# Patient Record
Sex: Male | Born: 1950
Health system: Southern US, Community
[De-identification: ages and names within clinical notes are randomized; demographics above are authoritative.]

## PROBLEM LIST (undated history)

## (undated) DIAGNOSIS — E785 Hyperlipidemia, unspecified: Secondary | ICD-10-CM

## (undated) DIAGNOSIS — M25562 Pain in left knee: Secondary | ICD-10-CM

## (undated) DIAGNOSIS — Z87442 Personal history of urinary calculi: Secondary | ICD-10-CM

## (undated) DIAGNOSIS — K219 Gastro-esophageal reflux disease without esophagitis: Secondary | ICD-10-CM

## (undated) DIAGNOSIS — J329 Chronic sinusitis, unspecified: Secondary | ICD-10-CM

## (undated) DIAGNOSIS — N529 Male erectile dysfunction, unspecified: Secondary | ICD-10-CM

## (undated) DIAGNOSIS — M25561 Pain in right knee: Secondary | ICD-10-CM

## (undated) DIAGNOSIS — J189 Pneumonia, unspecified organism: Secondary | ICD-10-CM

## (undated) DIAGNOSIS — H9190 Unspecified hearing loss, unspecified ear: Secondary | ICD-10-CM

## (undated) HISTORY — DX: Unspecified hearing loss, unspecified ear: H91.90

## (undated) HISTORY — PX: SINUS ENDO WITH FUSION: SHX5329

## (undated) HISTORY — DX: Pain in left knee: M25.562

## (undated) HISTORY — DX: Male erectile dysfunction, unspecified: N52.9

## (undated) HISTORY — PX: TOE SURGERY: SHX1073

## (undated) HISTORY — PX: EYE SURGERY: SHX253

## (undated) HISTORY — DX: Pain in right knee: M25.561

## (undated) HISTORY — PX: POLYPECTOMY: SHX149

## (undated) HISTORY — PX: MASTOIDECTOMY: SHX711

## (undated) HISTORY — DX: Chronic sinusitis, unspecified: J32.9

## (undated) HISTORY — PX: JOINT REPLACEMENT: SHX530

## (undated) HISTORY — PX: OTHER SURGICAL HISTORY: SHX169

## (undated) HISTORY — DX: Hyperlipidemia, unspecified: E78.5

---

## 1998-12-15 ENCOUNTER — Emergency Department (HOSPITAL_COMMUNITY): Admission: EM | Admit: 1998-12-15 | Discharge: 1998-12-15 | Payer: Self-pay | Admitting: Emergency Medicine

## 1998-12-15 ENCOUNTER — Encounter: Payer: Self-pay | Admitting: Emergency Medicine

## 1999-08-04 ENCOUNTER — Emergency Department (HOSPITAL_COMMUNITY): Admission: EM | Admit: 1999-08-04 | Discharge: 1999-08-04 | Payer: Self-pay | Admitting: Emergency Medicine

## 2001-06-27 ENCOUNTER — Ambulatory Visit (HOSPITAL_BASED_OUTPATIENT_CLINIC_OR_DEPARTMENT_OTHER): Admission: RE | Admit: 2001-06-27 | Discharge: 2001-06-27 | Payer: Self-pay | Admitting: *Deleted

## 2003-12-13 ENCOUNTER — Inpatient Hospital Stay (HOSPITAL_COMMUNITY): Admission: AD | Admit: 2003-12-13 | Discharge: 2003-12-14 | Payer: Self-pay | Admitting: Otolaryngology

## 2005-07-30 ENCOUNTER — Inpatient Hospital Stay (HOSPITAL_COMMUNITY): Admission: EM | Admit: 2005-07-30 | Discharge: 2005-07-31 | Payer: Self-pay | Admitting: Emergency Medicine

## 2005-07-30 ENCOUNTER — Ambulatory Visit: Payer: Self-pay | Admitting: Internal Medicine

## 2007-10-27 ENCOUNTER — Ambulatory Visit: Payer: Self-pay | Admitting: Internal Medicine

## 2007-10-27 DIAGNOSIS — J309 Allergic rhinitis, unspecified: Secondary | ICD-10-CM | POA: Insufficient documentation

## 2007-10-27 DIAGNOSIS — J45909 Unspecified asthma, uncomplicated: Secondary | ICD-10-CM | POA: Insufficient documentation

## 2007-10-27 DIAGNOSIS — H8309 Labyrinthitis, unspecified ear: Secondary | ICD-10-CM | POA: Insufficient documentation

## 2008-12-19 ENCOUNTER — Ambulatory Visit: Payer: Self-pay | Admitting: Internal Medicine

## 2008-12-19 LAB — CONVERTED CEMR LAB
ALT: 22 units/L (ref 0–53)
AST: 20 units/L (ref 0–37)
Albumin: 3.7 g/dL (ref 3.5–5.2)
BUN: 24 mg/dL — ABNORMAL HIGH (ref 6–23)
Basophils Absolute: 0 10*3/uL (ref 0.0–0.1)
Calcium: 9 mg/dL (ref 8.4–10.5)
Cholesterol: 203 mg/dL — ABNORMAL HIGH (ref 0–200)
Creatinine, Ser: 0.9 mg/dL (ref 0.4–1.5)
Eosinophils Relative: 12.5 % — ABNORMAL HIGH (ref 0.0–5.0)
HCT: 40.9 % (ref 39.0–52.0)
Hemoglobin: 14.1 g/dL (ref 13.0–17.0)
Leukocytes, UA: NEGATIVE
Lymphs Abs: 2 10*3/uL (ref 0.7–4.0)
MCV: 96.9 fL (ref 78.0–100.0)
Monocytes Absolute: 0.6 10*3/uL (ref 0.1–1.0)
Monocytes Relative: 8.3 % (ref 3.0–12.0)
Neutro Abs: 3.3 10*3/uL (ref 1.4–7.7)
PSA: 1.11 ng/mL (ref 0.10–4.00)
Platelets: 214 10*3/uL (ref 150.0–400.0)
RDW: 12.2 % (ref 11.5–14.6)
Specific Gravity, Urine: >=1.03 (ref 1.000–1.030)
TSH: 2.61 microintl units/mL (ref 0.35–5.50)
Total Bilirubin: 0.7 mg/dL (ref 0.3–1.2)
Triglycerides: 76 mg/dL (ref 0.0–149.0)
Urine Glucose: NEGATIVE mg/dL
Urobilinogen, UA: 0.2 (ref 0.0–1.0)

## 2009-01-04 ENCOUNTER — Ambulatory Visit: Payer: Self-pay | Admitting: Internal Medicine

## 2009-01-04 DIAGNOSIS — F411 Generalized anxiety disorder: Secondary | ICD-10-CM

## 2009-01-04 DIAGNOSIS — E785 Hyperlipidemia, unspecified: Secondary | ICD-10-CM

## 2010-02-05 ENCOUNTER — Ambulatory Visit: Payer: Self-pay | Admitting: Internal Medicine

## 2010-02-05 DIAGNOSIS — R519 Headache, unspecified: Secondary | ICD-10-CM | POA: Insufficient documentation

## 2010-02-05 DIAGNOSIS — J329 Chronic sinusitis, unspecified: Secondary | ICD-10-CM | POA: Insufficient documentation

## 2010-02-05 DIAGNOSIS — R51 Headache: Secondary | ICD-10-CM | POA: Insufficient documentation

## 2010-02-13 ENCOUNTER — Ambulatory Visit (HOSPITAL_COMMUNITY): Admission: RE | Admit: 2010-02-13 | Discharge: 2010-02-13 | Payer: Self-pay | Admitting: Internal Medicine

## 2010-02-23 ENCOUNTER — Encounter: Payer: Self-pay | Admitting: Internal Medicine

## 2010-03-02 ENCOUNTER — Ambulatory Visit: Payer: Self-pay | Admitting: Interventional Radiology

## 2010-03-02 ENCOUNTER — Emergency Department (HOSPITAL_BASED_OUTPATIENT_CLINIC_OR_DEPARTMENT_OTHER): Admission: EM | Admit: 2010-03-02 | Discharge: 2010-03-02 | Payer: Self-pay | Admitting: Emergency Medicine

## 2010-03-11 ENCOUNTER — Ambulatory Visit: Payer: Self-pay | Admitting: Internal Medicine

## 2010-08-23 ENCOUNTER — Emergency Department (HOSPITAL_BASED_OUTPATIENT_CLINIC_OR_DEPARTMENT_OTHER)
Admission: EM | Admit: 2010-08-23 | Discharge: 2010-08-23 | Payer: Self-pay | Source: Home / Self Care | Admitting: Emergency Medicine

## 2010-08-26 LAB — BASIC METABOLIC PANEL
BUN: 24 mg/dL — ABNORMAL HIGH (ref 6–23)
CO2: 23 mEq/L (ref 19–32)
Calcium: 8.7 mg/dL (ref 8.4–10.5)
Creatinine, Ser: 1 mg/dL (ref 0.4–1.5)
GFR calc non Af Amer: >60 mL/min (ref >60)
Glucose, Bld: 85 mg/dL (ref 70–99)
Sodium: 142 mEq/L (ref 135–145)

## 2010-08-26 LAB — DIFFERENTIAL
Basophils Absolute: 0 10*3/uL (ref 0.0–0.1)
Eosinophils Relative: 19 % — ABNORMAL HIGH (ref 0–5)
Lymphocytes Relative: 34 % (ref 12–46)
Monocytes Absolute: 0.7 10*3/uL (ref 0.1–1.0)
Monocytes Relative: 8 % (ref 3–12)

## 2010-08-26 LAB — CBC
HCT: 39.2 % (ref 39.0–52.0)
MCH: 31.9 pg (ref 26.0–34.0)
MCHC: 35.2 g/dL (ref 30.0–36.0)
MCV: 90.7 fL (ref 78.0–100.0)
RDW: 12.5 % (ref 11.5–15.5)

## 2010-09-02 NOTE — Assessment & Plan Note (Signed)
Summary: DR [facility] PT/[facility]--HEAD PAIN-PARAMEDICS/02/03/10-STC   Vital Signs:  Patient profile:   60 year old male Height:      71 inches (180.34 cm) Weight:      185.8 pounds (84.45 kg) BMI:     26.01 O2 Sat:      98 % on Room air Temp:     98.4 degrees F (36.89 degrees C) oral Pulse rate:   54 / minute BP sitting:   140 / 90  (left arm) Cuff size:   regular  Vitals Entered By: Orlan Leavens (February 05, 2010 11:21 AM)  O2 Flow:  Room air CC: Head pain, Headaches Is Patient Diabetic? No Pain Assessment Patient in pain? yes     Location: head Type: sharp @ times Comments Pt states been having some head pain x's 6 weeks. On Monday had episode pain in top of his head. sharp apin goes to right side of his head. Wife called EMS. She also stated after sxs happen pt can't communicate say anything. Pt denies any blurred vision, light headed   Primary Care Provider:  Norins  CC:  Head pain and Headaches.  History of Present Illness:  Headaches      This is a 60 year old man who presents with Headaches.  The symptoms began 2 months ago.  On a scale of 1 to 10, the intensity is described as a moderate-severe.  no prior headaches but 3 episodes in past 6 weeks. last one 02/03/10 so severe wife called 911 but pt refused ER eval or transport due to resolution of symptoms. describes as sudden onset of sharp stabbling pain in left temporal region. pain lasts <5 seconds then spontaneous and complete resolution - no residual pain. during the pain attack, wife reports pt "dazed and unresponsive, unable to talk or move" - no seizure activity or LOC.  The patient reports sinus pain, sinus pressure, and photophobia, but denies nausea, vomiting, sweats, and tearing of eyes.  The headache is described as stabbing and sharp.  The location of the pain is first on left and then bilateral.  High-risk features (red flags) include altered mental status, new type of headache, age >50 years, and concomitant infection  (sinus and ear, chronic).  The patient denies the following high-risk features: fever, neck pain/stiffness, vision loss or change, focal weakness, rash, trauma, pain worse with exertion, immunosuppression, and anticoagulation use.  Prior treatment has included no medication.    Current Medications (verified): 1)  Flonase 50 Mcg/act  Susp (Fluticasone Propionate) .... As Needed 2)  Symbicort 165 .... 2 Puffs Orally Qid  Allergies (verified): No Known Drug Allergies  Past History:  Past Medical History: Allergic rhinitis Asthma-has been hospitalized and ventilated chronic sinusitus and ear infections - s/p surg for same 12/2003 with post op seizure ([facility])  Past Surgical History: mastoidectomy 12/2003 Dorma Russell) right hand surgery for infection '07  Social History: HSG  married '76-6 years divorced; '85 1 son - ''37 work: Retail banker department  Review of Systems       The patient complains of decreased hearing.  The patient denies vision loss, hoarseness, syncope, incontinence, suspicious skin lesions, transient blindness, and difficulty walking.    Physical Exam  General:  alert, well-developed, well-nourished, and cooperative to examination.   nontoxic - wife at side Ears:  L with hearing aid - when removed, normal pinnae bilaterally, without erythema, swelling, or tenderness to palpation. L TMs hazy but without effusion, or cerumen impaction. R TM clear  with performation, no drainage or effusion. Hearing grossly diminished bilaterally  Mouth:  teeth and gums in good repair; mucous membranes moist, without lesions or ulcers. oropharynx clear without exudate, min erythema. +yellow PND Lungs:  normal respiratory effort, no intercostal retractions or use of accessory muscles; normal breath sounds bilaterally - no crackles and no wheezes.    Heart:  normal rate, regular rhythm, no murmur, and no rub. BLE without edema. Neurologic:  alert & oriented X3 and cranial nerves II-XII  symetrically intact.  strength normal in all extremities, sensation intact to light touch, and gait normal. speech fluent without dysarthria or aphasia; follows commands with good comprehension.    Impression & Recommendations:  Problem # 1:  HEADACHE (ICD-784.0)  ?migraine type feature or relation to underlying sinus infx hx -- neuro exam benign but given hx prior sz with infx postop in 12/2003, feel MRI brain warranted - episodes too brief to require med tx at this time - f/u PCP on same in 2-4 weeks to review  Orders: Radiology Referral (Radiology)  Problem # 2:  OTHER CHRONIC SINUSITIS (ICD-473.8)  hosp and op hx 2005 reviewed on hosp EMR - tx 2 weeks augmentin at this time and refer back to ENT -[facility] preferred by pt/wife (lost to f/u d/t insurance issues at that time) His updated medication list for this problem includes:    Flonase 50 Mcg/act Susp (Fluticasone propionate) .Marland Kitchen... As needed    Augmentin 875-125 Mg Tabs (Amoxicillin-pot clavulanate) .Marland Kitchen... 1 by mouth two times a day x 2 weeks  Orders: Radiology Referral (Radiology) ENT Referral (ENT) Prescription Created Electronically [phone])  Problem # 3:  INSOMNIA, CHRONIC (ICD-307.42) intol temazeam in past- try amitrip (wife does well on same) -  Orders: Prescription Created Electronically [phone])  Complete Medication List: 1)  Flonase 50 Mcg/act Susp (Fluticasone propionate) .... As needed 2)  Symbicort 165  .... 2 puffs orally qid 3)  Augmentin 875-125 Mg Tabs (Amoxicillin-pot clavulanate) .Marland Kitchen.. 1 by mouth two times a day x 2 weeks 4)  Amitriptyline Hcl 10 Mg Tabs (Amitriptyline hcl) .Marland Kitchen.. 1 by mouth at bedtime as needed for sleep  Patient Instructions: 1)  it was good to see you today. 2)  2 weeks Augmentin for your infection - 3)  low dose amitriptyline for sleep- 4)  your prescriptions have been electronically submitted to your pharmacy. Please take as directed. Contact our office if you believe you're having  problems with the medication(s).  5)  we'll make referral for MRI brain and to Dr. Dorma Russell (ENT) . Our office will contact you regarding these appointments once made.  6)  Please schedule a follow-up appointment in 2-4 weeks with dr. Debby Bud to review, call sooner if problems.  Prescriptions: AMITRIPTYLINE HCL 10 MG TABS (AMITRIPTYLINE HCL) 1 by mouth at bedtime as needed for sleep  #30 x 1   Entered and Authorized by:   Newt Lukes MD   Signed by:   Newt Lukes MD on 02/05/2010   Method used:   Electronically to        [facility] # [phone]* (retail)       [address] [place], [place]  [address]       Ph: [phone]       Fax: [phone]   RxID:   [id number] AUGMENTIN 875-125 MG TABS (AMOXICILLIN-POT CLAVULANATE) 1 by mouth two times a day x 2 weeks  #28 x 0   Entered  and Authorized by:   Newt Lukes MD   Signed by:   Newt Lukes MD on 02/05/2010   Method used:   Electronically to        [facility] [phone]* (retail)       [address] [place], [place]  [address]       Ph: [phone]       Fax: [phone]   RxID:   [id number]

## 2010-09-02 NOTE — Letter (Signed)
   [facility] [address] [place], [place]  [address] Phone: [phone]      February 23, 2010   Stan Creger [address] [place], [place] [address]  RE:  LAB RESULTS  Dear  Mr. MATHENY,  The following is an interpretation of your most recent lab tests.  Please take note of any instructions provided or changes to medications that have resulted from your lab work.    MRI head from the 14th of July - post-surgical changes and chronic sinusitis. No obstructions noted.   Call or e-mail me if you have questions ([email or web address]).   Sincerely Yours,    Jacques Navy MD

## 2010-12-19 NOTE — Discharge Summary (Signed)
NAME:  Brett Berry NO.:  [id number]   MEDICAL RECORD NO.:  [id number]                   PATIENT TYPE:  INP   LOCATION:  3107                                 FACILITY:  [facility]   PHYSICIAN:  Carolan Shiver, M.D.                 DATE OF BIRTH:  1950/12/01   DATE OF ADMISSION:  12/13/2003  DATE OF DISCHARGE:  12/14/2003                                 DISCHARGE SUMMARY   ADMISSION DIAGNOSIS:  Focal spells, unknown etiology with decreased vision,  right arm movement, head turning and groaning following an uncomplicated  right canal wall tympanomastoidectomy earlier in the morning of Dec 13, 2003.   SUMMARY OF HOSPITALIZATION:  Brett Berry is a 60 year old white male who had  undergone an uncomplicated right tympanomastoidectomy in the morning of Dec 13, 2003 at the [facility] to treat chronic  right mastoiditis.  Brett Berry had an uncomplicated operation and was  recovering in the PACU at the [facility].   He was transferred to his room and approximately 3 p.m., began to have 5  unusual spells lasting 5-10 seconds which were comprised of a sudden head  turning to the left, groaning, complaint of decreased vision and jerking of  his right upper extremity.  He was completely awake immediately after each  spell, was oriented x3.  I witnessed one of the spells approximately 5:30  p.m.  His procedure was uncomplicated, lasted approximately 2 hours. There  was no dural exposure or exposure of the sigmoid sinus. He had received  general anesthetic along with Zofran 4 mg IV at the beginning and end of the  procedure and Decadron 10 mg IV along with Transderm scopolamine patch from  7:00 a.m. to 5:00 p.m.   He was seen by Dr. Dennie Bible __________ of anesthesia who did not think the spells  were secondary to the anesthesia, although we could not rule out an extra-  pyramidal type reaction to the Zofran.  He had received 500 cc  of fluid, had  700 cc of urine output and a 30 cc blood loss.  He did have a history of  PVCs and had undergone a negative Cardiolite study the week before at his  family doctor, Dr. Illene Regulus of [facility].   Brett Berry was transferred to [facility] NICU for observation. A head CT  without contrast, laboratory data including complete blood count with  differential and CMET, and a neurology consultation by Dr. Orlin Hilding.  The  patient underwent a CT scan of his head without contrast which was normal.  He had several spells in transport from the [facility] to the [facility].  He was seen by Dr. Orlin Hilding on Dec 14, 2003.   By Dec 14, 2003, he was awake, alert, had a questionable spell, vital signs  were stable,  temperature 98.5, stable vital signs and an SA02 of 99 on room  air.  Facial function was intact and his post-auricular drain was removed.  Electrolytes were within normal limits.  Hemoglobin was 14.4, hematocrit  41.7, white blood cell count 10,200.  Liver function tests were within  normal limits, sodium 140, potassium 4.6, CO2 108, BUN 9, creatinine 0.9,  glucose 119.  His EEG was portable done on Dec 14, 2003. It was a normal EEG  in the awake and drowsy state. There was no evidence of any ictal or  interictal discharges as read by Dr. Lesia Sago of neurology.  An MRI head  scan with and without gadolinium was ordered, and was found to be negative.   DISPOSITION:  The patient was discharged on Dec 14, 2003 on a regular diet,  quiet indoor activity x1 week and avoidance of water exposure a.d.  He was  to return to my office in 1 week for follow up and suture removal.  He was  to call [phone] for any postoperative problems. He was given both verbal  and written instructions as was his wife.   DISCHARGE MEDICATIONS:  1. Augmentin XR 1 p.o. b.i.d. x10 days.  2. Percocet 7.5/500 one p.o. q.6h. p.r.n. pain #30.  3. Phenergan suppositories 25 mg p.o.  q.6h. p.r.n. nausea.  4. Cipro HC drops, 3 drops a.d. t.i.d. x7 days.   The etiology of the spells was never determined.                                                Carolan Shiver, M.D.    EMK/MEDQ  D:  12/31/2003  T:  12/31/2003  Job:  [id number]

## 2010-12-19 NOTE — Procedures (Signed)
PROCEDURE:  Portable electroencephalogram recording done on Dec 14, 2003.   HISTORY OF PRESENT ILLNESS:  This patient is a 60 year old gentleman status  post right mastoidectomy with episodes of right arm tremor and staring  spells.  The patient is being evaluated for seizure events.   This is a portable EEG recording.  No skull defects were noted.   MEDICATIONS:  1. Pain medications.   EEG classification normal awake and drowsy.   DESCRIPTION OF PROCEDURE:  Description of the recording background rhythms.  This recording consists of a fairly well modulated medium amplitude alpha  rhythm of 9 hertz that is reactive to eye opening and closure.  As the  record progresses, the patient at times seems to enter the drowsy state with  dropout of the background rhythm activities and onset of a 7 to 8 hertz  slowing seen.  The patient does not appear to enter stage two sleep at any  time.  Photic stimulation and hyperventilation were not performed.  At no  time during the recording did there appear to be evidence of a spike or  spike wave discharges or ictal or inter-ictal discharges.  The EKG monitor  shows relatively frequent premature ventricular complexes with a heart rate  of 72.   IMPRESSION:  This is a normal EEG recording in the waking and drowsy state.  No evidence of ictal or inter-ictal discharges are seen.    Marlan Palau, M.D.   [id number]  D:  12/14/2003 16:11:16  T:  12/14/2003 17:51:59  Job #:  [id number]

## 2010-12-19 NOTE — H&P (Signed)
NAME:  Brett, Berry NO.:  [id number]   MEDICAL RECORD NO.:  [id number]                   PATIENT TYPE:  INP   LOCATION:  3107                                 FACILITY:  [facility]   PHYSICIAN:  Carolan Shiver, M.D.                 DATE OF BIRTH:  1950-11-11   DATE OF ADMISSION:  12/13/2003  DATE OF DISCHARGE:                                HISTORY & PHYSICAL   CHIEF COMPLAINT:  Jerky spells, status post tympanomastoidectomy.   HISTORY OF PRESENT ILLNESS:  Brett Berry is a 60 year old white male who  underwent a right tempanomastoidectomy for chronic mastoiditis this morning  at the [facility].  He was well until 3 p.m.  when he developed some jerking spells involving a sudden head turn to the  left, decreased vision, jerking of his right arm, groaning and quick  recovery without any postictal symptoms.  He was seen by Mauricio Po of  anesthesia who did not feel that this was an anesthetic reaction.  The  patient had received Zofran 4 mg IV at the beginning of the procedure, and 4  mg IV at the termination of the procedure as well as a Transderm Scopolamine  patch which had been in place from 7 a.m. to 5 p.m. and Decadron 10 mg at 8  a.m. and another 8 mg at 2 p.m.  The patient was completely awake  immediately after the spell and was oriented x3.  I witnessed one of the  spells at his bedside.  He states that he had sudden pain in his right ear,  and this would trigger the spell.  He had a total of five spells at [facility], and two spells following transfer to [facility].  The  patient again underwent a right tympanomastoidectomy this morning to treat  chronic mastoiditis.  The procedure was uncomplicated, lasting two hours.  No dura or sigmoid sinus was exposed.  The patient does not have any history  of head trauma, previous seizures or stroke.  He received a general  anesthetic along with his Zofran as  previously noted.  A glucose done after  one of the spells was 177.  This was after receiving the Decadron today.  He  was treated with Versed 1 mg with no response and then 2 mg later caused  sleeping, but the spells continued.  His vital signs have been completely  stable.  He had received 1500 cc of IV fluid intraoperatively, and had a 700  cc urine output along with a 30 cc blood loss.  There was no reason for him  to be electrolyte imbalanced.  He had frequent PVC's on EKG and clinically,  but had had a negative Cardiolite study last week ordered by Dr. Rosalyn Gess.  Norins, his family physician.   The patient was transferred to [facility] for  observation.  A CT scan of his without contrast and CBC with diff and CMET  will be ordered.  The EMT's reported two spells on the way from [facility]  to the hospital.  He had no nausea, vomiting, drooling, etc.   PAST MEDICAL HISTORY:  1. A history of asthma.  2. Sinus infection.  3. Chronic ear disease.   OPERATIONS:  1. Sinus procedure.  2. Tubes in the past.  3. Today, he underwent an uncomplicated right tempanomastoidectomy with     placement of a T-tube in his right tympanic membrane.  He was found to     have chronic infection in his mastoid and normal middle ear, mobile     ossicles and no exposure of his facial nerve or any dura.   MEDICATION ALLERGIES:  None reported.   FAMILY HISTORY:  Positive for heart disease and hearing loss.   SOCIAL HISTORY:  He is married.  He works as a Merchandiser, retail.  He has a 12th  grade education.  He does not use alcohol or tobacco products or any illegal  drugs.   REVIEW OF SYSTEMS:  Positive for a history of fever and weight loss from his  ear infections, the chronic ear disease, difficulty smelling after his sinus  operation, history of asthma, and renal lithiasis.  He has had some seasonal  allergies.   PHYSICAL EXAMINATION:  VITAL SIGNS:  Blood pressure 124/70,  pulse 59 and  regular, with frequent PVC's, respiratory rate 18, SAO2 96 percent on room  air.  Glucose is 177.  HEENT:  His head was normocephalic with bilateral symmetric facial motion.  He had no nystagmus.  Pupils PERRLA, external ears were stable.  Right posterior incision was  stable with a drain in place, draining minimal fluid.  Left tube is in  position.  The ear is dry.  Nose is negative.  Oral cavity, lips, tongue and  palate normal.  NECK:  Negative.  CHEST:  Clear.  HEART:  Normal sinus rhythm with intermittent PVC's.  ABDOMEN:  Chest was otherwise clear.  Abdomen was benign.  EXTREMITIES:  Negative.  NEUROLOGIC:  He was oriented x3 and able to calculate.   IMPRESSION:  Intermittent jerking episodes lasting 5-10 seconds, associated  with decreased vision, head turning to the left, right arm shaking and  immediate awareness after each spell.  It is possible that he is having a  delayed reaction to ondansetron.  I doubt that he has any electrolyte  imbalance.  I can not rule out an intracranial process.   RECOMMENDATIONS:  1. Observation in ICU tonight.  2. CT scan of his head without contrast.  3. CBC with differential and CMET.  4. Urology consultation.  5. I have spoken to the patient and his family, including his wife and     extended family.                                                Carolan Shiver, M.D.    EMK/MEDQ  D:  12/13/2003  T:  12/13/2003  Job:  [id number]

## 2010-12-19 NOTE — Consult Note (Signed)
NAME:  Brett Berry, Brett Berry NO.:  [id number]   MEDICAL RECORD NO.:  [id number]                   PATIENT TYPE:  INP   LOCATION:  3107                                 FACILITY:  [facility]   PHYSICIAN:  Marlan Palau, M.D.               DATE OF BIRTH:  May 14, 1951   DATE OF CONSULTATION:  12/14/2003  DATE OF DISCHARGE:                                   CONSULTATION   CONSULTING PHYSICIAN:  Marlan Palau, M.D.   HISTORY OF PRESENT ILLNESS:  Brett Berry is a 60 year old, right handed,  white male, born 1951/05/01, with a history of asthma and sinus  problems.  The patient was brought in for a right mastoidectomy that  occurred without complication yesterday.  This patient noted that around 3  p.m. yesterday began to have onset of right upper extremity tremors,  starring spells.  These episodes last 15-30 seconds, and the patient would  have no postictal confusion following the events.  This patient would not  remember events occurring during the spells however.  The patient began  having the events every 15 minutes which persisted until about 10 p.m.  The  patient was treated with some versed.  Wife claims that the events would  occur even while the patient was sleeping.  The patient underwent a CT scan  of the brain that is unremarkable by my review.  Neurology is asked to see  this patient for further evaluation.  The patient has not been treated with  any other anticonvulsants per se.  He has not had any event since 10 p.m.  yesterday.  He is feeling fine, feeling normal today.  The patient denies  any focal numbness or weakness on the face, arms, legs, speech trouble,  swallowing problems.   PAST MEDICAL HISTORY:  1. History of new onset spells of right sided tremor, rule out seizures.  2. History of asthma with ventilator assistance in the past.  3. History of right mastoidectomy this admission for renal __________  V     sinus surgery in the  past.  4. Decreased auditory acuity, hearing aid in the left ear.   MEDICATIONS:  At this time include:  Decadron 6 mg one dose, morphine if  needed., Senokot if needed, Versed p.r.n., Demerol p.r.n.   The patient does not smoke.  Drinks alcohol on occasion.   Again, no known allergies.   SOCIAL HISTORY:  The patient is married, lives in the [place] area, works  as a Passenger transport manager.  The patient has one son.  Has a history of asthma.   FAMILY MEDICAL HISTORY:  Notable that mother is alive, has had history of  knee hip problems, hypotension.  Father alive with coronary artery disease,  prostate cancer, senile dementia of Alzheimer's type.  The patient has two  half brothers and sisters with coronary artery disease, five full  brothers  and sisters who are alive and well.   REVIEW OF SYSTEMS:  Noted for no recent fevers, chills.  The patient has had  some headache pain following surgery but not prior to surgery.  The patient  denies any trouble swallowing, choking, shortness of breath, chest pain.  The patient denies any double vision, loss of vision.  Denies any trouble  controlling the bowels or bladder.  Denies focal numbness or weakness on the  face, arms, or legs.  Denies any previous episodes of tremor or seizures.   PHYSICAL EXAMINATION:  VITAL SIGNS:  Blood pressure is 114/46, heart rate is  68, respiratory rate is 17, temperature afebrile.  GENERAL:  This patient is a well developed white male who is alert,  cooperative at the time of examination.  HEENT:  Head is atraumatic with the exception of the surgical site on the  right.  Pupils equal, round and reactive to light.  Disks are flat  bilaterally.  NECK:  Supple.  No carotid bruits noted.  RESPIRATORY:  Clear.  CARDIOVASCULAR:  Reveals a regular rate and rhythm.  No obvious murmurs rubs  noted.  EXTREMITIES:  Without significant edema.  NEUROLOGIC:  Cranial nerves as above.  Facial symmetry is present.  The   patient has good sensation of the face to pinprick and soft touch  bilaterally.  He has good strength of the facial muscles, muscles of head  turning, shoulder shrug bilaterally.  Speech is well enunciated, not  aphasic.  Motor testing was 5/5 strength in all four extremities.  Good  symmetric motor tone is noted throughout.  Sensory testing is intact to pin  prick, soft touch, vibratory sensation throughout.  The patient has fair  finger-to-nose-finger, toe-to-finger bilaterally.  The patient was not  ambulated.  Deep tendon reflexes are symmetric, normal.  Toes were neutral  bilaterally.  Again, no drift is seen.   LABORATORY VALUES:  Notable for hemoglobin 15.2, otherwise no blood results  available.   IMPRESSION:  1. Episodic tremor, right upper extremity, starring episodes rule out     seizure event.  2. Recent right mastoidectomy.   This patient is doing well at this point.  He is back to baseline.  The  patient believes that he may have had some unusual response to pain.  No  prior history of seizures.  No focal deficits at this point.  We will  proceed with further workup rule out a focal neurologic lesion underlying  the above events.   PLAN:  1. MRI of the brain.  2. EEG study.  3. No anticonvulsants for now.   We will follow the patient's clinical course while in-house.                                               Marlan Palau, M.D.    CKW/MEDQ  D:  12/14/2003  T:  12/14/2003  Job:  [id number]   cc:   Carolan Shiver, M.D.  [address]  [place]  [place] [address]  Fax: [phone]   [facility]  [address]  [address]

## 2010-12-19 NOTE — Op Note (Signed)
[name]  Patient:    Brett, Berry Visit Number: [id number] MRN: [id number]          Service Type: Attending:  Doris Cheadle. Lyman Bishop, M.D. Dictated by:   Doris Cheadle. Lyman Bishop, M.D. Proc. Date: 06/27/01                             Operative Report  PREOPERATIVE DIAGNOSIS:  Bilateral mucoid otitis.  POSTOPERATIVE DIAGNOSIS:  Bilateral mucoid otitis.  PROCEDURE:  Bilateral T-tube myringotomy.  SURGEON:  Robert L. Lyman Bishop, M.D.  ANESTHESIA:  General.  DESCRIPTION OF PROCEDURE:  This 60 year old white male has had a history of chronic pansinusitis, polypoid sinus degeneration, recurrent otitis media with mucoid otitis, has had previous extensive sinus surgery and bilateral tube myringotomies.  Patient recently treated for acute otitis media with persistent fluid.  Examination showed opaque, scarred, retracted, poorly mobile drums.  Audiogram showed a mixed hearing loss.  The patient is experiencing difficulty communicating.  Because of his hearing loss and because of his chronic recurring ear infections, T-tube myringotomies were recommended to stabilize the conductive hearing loss component, and the patient is admitted for surgery.  DESCRIPTION OF PROCEDURE:  After satisfactory general mask anesthesia using an LMA was induced, using the operative microscope, examination showed opaque, scarred, slightly retracted eardrums, bilateral radial myringotomy incisions were made in the anterior inferior quadrant of each eardrum.  The middle ear space on each side was for the most part well-aerated, but there was a small amount of very thick but clear mucoid exudate.  Modified Silastic T-tubes were inserted.  Cipro HC drops were instilled and insufflated through the tubes. There was no bleeding.  The patient tolerated the procedure well, was awakened from anesthesia and taken to the recovery room in satisfactory condition. Dictated by:   Doris Cheadle.  Lyman Bishop, M.D. Attending:  Doris Cheadle. Lyman Bishop, M.D. DD:  06/27/01 TD:  06/27/01 Job: [id number]

## 2010-12-19 NOTE — Op Note (Signed)
[name], Berry                 ACCOUNT NO.:  [id number]   MEDICAL RECORD NO.:  [id number]          PATIENT TYPE:  INP   LOCATION:  1825                         FACILITY:  [facility]   PHYSICIAN:  Katy Fitch. Sypher, M.D. DATE OF BIRTH:  [birth year]   DATE OF PROCEDURE:  07/30/2005  DATE OF DISCHARGE:                                 OPERATIVE REPORT   PREOPERATIVE DIAGNOSIS:  Chronic septic extensor tenosynovitis dorsal aspect  of right hand and wrist in fourth dorsal compartment status post injury on  July 17, 2005, status post unsuccessful treatment with oral antibiotic  therapy x13 days.   POSTOPERATIVE DIAGNOSIS:  Chronic septic extensor tenosynovitis of right  fourth dorsal compartment with fibrinous exudate, rice bodies and signs of  chronic tenosynovitis.   OPERATION:  Incision and drainage dorsal aspect of hand and incision and  drainage of right fourth dorsal compartment with debridement of fibrin rice  bodies and cultures for aerobic and anaerobic growth, acid-fast and fungal  growth as well as acid-fast and fungal smears.   OPERATING SURGEON:  Katy Fitch. Sypher, M.D.   ASSISTANT:  Molly Maduro Dasnoit PA-C.   ANESTHESIA:  General by LMA.   SUPERVISING ANESTHESIOLOGIST:  Kaylyn Layer. Michelle Piper, M.D.   SPECIMENS:  Note that we obtained aerobic and anaerobic cultures as well as  fibrin rice bodies for histopathologic evaluation to look for organisms.   INDICATIONS:  Brett Berry is a 60 year old gentleman referred through the  courtesy of Dr. Ellamae Sia at urgent medical care center for  evaluation and management chronic right extensor tenosynovitis.   Brett Berry reports that on July 17, 2005, he sustained a laceration to the  dorsal aspect of his right hand on a creel at work. He had two wounds, one  overlying the distal diaphysis of his long finger metacarpal and a second  overlying the capitate region of his right wrist.   Within 48 hours, he began to experience  swelling and redness consistent with  infection.   He was seen by the physicians at the urgent medical care center where his  wound was noted to be probably infected.  He was placed on doxycycline 100  mg p.o. b.i.d.   He initially had a satisfactory resolution of his erythema.  However,  approximately 48 hours ago, he developed acute swelling and pain. He then  developed signs of lymphangitis extending to the elbow. He returned to the  urgent medical care center on July 29, 2005, and was noted to have  probable sepsis of his extensor tendons.   Hand surgery consult was requested for the morning of July 30, 2005.   I was contacted by phone by Dr. Merla Riches and, due to the need for probable  incision and drainage, we recommended that Brett Berry be sent to the [employer] [facility] [name]. There, he was admitted by the medical  teaching service B as chronic MRSA infections another infections are  excellent teaching cases.   We have provided upper extremity orthopedic consultation.   Preoperatively, lab studies were checked. Brett Berry was noted to  have a white  count of 15,000 at the urgent medical care center 24 hours ago and a repeat  white count at this time revealed a white count of 12.1000.   A uric acid was attained and was notable for a normal value of 5.   Brett Berry was seen in consultation and advised to go for immediate incision  and drainage of his extensor tendons. He was noted to have no signs of wrist  joint sepsis at this time.   PROCEDURE:  Brett Berry was brought to the operating room and placed in the  supine position upon the operating table.   Following the induction of general anesthesia by LMA technique, the left arm  was prepped with Betadine soap and solution and sterilely draped. A  pneumatic tourniquet was applied to the proximal brachium.   Following elevation of the left arm for one minute, the arterial tourniquet  was inflated  to 220 mmHg.   Procedure commenced with excision of his prior traumatic wound crust.   Subcutaneous regions were carefully palpated and extensive tenosynovitis of  the fourth dorsal compartment was noted.   A blunt hemostat was used and a sucker was used to debride fibrin from the  fourth dorsal compartment. Several large specimens of fibrin rice body were  removed for aerobic and anaerobic culture as well as fungal and AFB smear  and culture.   The fourth dorsal compartment was subsequently opened proximal to the  extensor retinaculum and a hemostat was used to spread and remove fibrinous  tenosynovitis.   Through-and-through vessel loop drains were placed from the diaphyseal  region of long finger metacarpal to the distal margin of the retinaculum and  second through the deep fourth dorsal compartment and brought out proximal  to the extensor retinaculum.   After debridement of all palpable rice bodies, the wound was irrigated  thoroughly with triple embolic solution, followed by dressing of the wound  with Adaptic sterile gauze, sterile Webril and a volar plaster splint for  comfort.   Brett Berry tolerated surgery ad anesthesia well. There were no apparent  complications.   He was awakened from general anesthesia transferred to the recovery room  with stable vital signs.   He will be admitted to the hospital on the medicine B teaching service with  supervision of the upper extremity orthopedic service.   We anticipate 24-48 hours of IV vancomycin therapy as well as Zosyn  initially followed by appropriate oral antibiotics once we have our culture  and smear results.      Katy Fitch Sypher, M.D.  Electronically Signed     RVS/MEDQ  D:  07/30/2005  T:  07/31/2005  Job:  [id number]   cc:   Medicine B Teaching Service Dr. Stephenie Acres

## 2011-07-06 ENCOUNTER — Emergency Department (HOSPITAL_BASED_OUTPATIENT_CLINIC_OR_DEPARTMENT_OTHER)
Admission: EM | Admit: 2011-07-06 | Discharge: 2011-07-06 | Disposition: A | Discharge status: Home / Self Care | Payer: BC Managed Care – PPO | Attending: Emergency Medicine | Admitting: Emergency Medicine

## 2011-07-06 ENCOUNTER — Emergency Department (INDEPENDENT_AMBULATORY_CARE_PROVIDER_SITE_OTHER): Payer: BC Managed Care – PPO

## 2011-07-06 ENCOUNTER — Encounter: Payer: Self-pay | Admitting: *Deleted

## 2011-07-06 DIAGNOSIS — M7150 Other bursitis, not elsewhere classified, unspecified site: Secondary | ICD-10-CM | POA: Insufficient documentation

## 2011-07-06 DIAGNOSIS — M25529 Pain in unspecified elbow: Secondary | ICD-10-CM

## 2011-07-06 DIAGNOSIS — M7989 Other specified soft tissue disorders: Secondary | ICD-10-CM

## 2011-07-06 DIAGNOSIS — IMO0001 Reserved for inherently not codable concepts without codable children: Secondary | ICD-10-CM | POA: Insufficient documentation

## 2011-07-06 DIAGNOSIS — M719 Bursopathy, unspecified: Secondary | ICD-10-CM

## 2011-07-06 DIAGNOSIS — J45909 Unspecified asthma, uncomplicated: Secondary | ICD-10-CM | POA: Insufficient documentation

## 2011-07-06 MED ORDER — HYDROCODONE-ACETAMINOPHEN 5-325 MG PO TABS
2.0000 | ORAL_TABLET | Freq: Once | ORAL | Status: AC
  Administered 2011-07-06: 2 via ORAL
  Filled 2011-07-06: qty 2

## 2011-07-06 MED ORDER — DOXYCYCLINE HYCLATE 100 MG PO CAPS: 100.0000 mg | ORAL_CAPSULE | Freq: Two times a day (BID) | ORAL | Status: AC

## 2011-07-06 MED ORDER — CEFTRIAXONE SODIUM 1 G IJ SOLR
1.0000 g | Freq: Once | INTRAMUSCULAR | Status: AC
  Administered 2011-07-06: 1 g via INTRAMUSCULAR
  Filled 2011-07-06: qty 10

## 2011-07-06 MED ORDER — CEPHALEXIN 500 MG PO CAPS: 500.0000 mg | ORAL_CAPSULE | Freq: Four times a day (QID) | ORAL | Status: AC

## 2011-07-06 MED ORDER — HYDROCODONE-ACETAMINOPHEN 5-325 MG PO TABS: 2.0000 | ORAL_TABLET | ORAL | Status: AC | PRN

## 2011-07-06 MED ORDER — LIDOCAINE HCL (PF) 1 % IJ SOLN
INTRAMUSCULAR | Status: AC
  Administered 2011-07-06: 15:00:00
  Filled 2011-07-06: qty 5

## 2011-07-06 NOTE — ED Notes (Signed)
Pt c/o left elbow and arm pain with redness noted x 1 day

## 2011-07-06 NOTE — ED Notes (Signed)
Left elbow is swollen, tender and reddened. Pt denies any injury or trauma.

## 2011-07-06 NOTE — ED Provider Notes (Signed)
History     CSN: [id number] Arrival date & time: 07/06/2011  1:02 PM   First MD Initiated Contact with Patient 07/06/11 1426      Chief Complaint  Patient presents with  . Arm Injury    (Consider location/radiation/quality/duration/timing/severity/associated sxs/prior treatment) Patient is a 60 y.o. male presenting with arm injury. The history is provided by the patient. No language interpreter was used.  Arm Injury  The incident occurred today. The injury mechanism is unknown. No protective equipment was used. He came to the ER via personal transport. The pain is moderate. It is unlikely that a foreign body is present. Pertinent negatives include no numbness and no tingling. There have been no prior injuries to these areas. He is right-handed. His tetanus status is UTD. There were no sick contacts.  Pt reports he began having swelling to left elbow.  Pt denies any injury.   Past Medical History  Diagnosis Date  . Asthma     Past Surgical History  Procedure Date  . Sinus endo with fusion     History reviewed. No pertinent family history.  History  Substance Use Topics  . Smoking status: Never Smoker   . Smokeless tobacco: Not on file  . Alcohol Use: No      Review of Systems  Constitutional: Negative.   Musculoskeletal: Positive for myalgias and joint swelling.  Skin: Positive for wound.  Neurological: Negative for tingling and numbness.  All other systems reviewed and are negative.    Allergies  Review of patient's allergies indicates no known allergies.  Home Medications   Current Outpatient Rx  Name Route Sig Dispense Refill  . BUDESONIDE-FORMOTEROL FUMARATE 160-4.5 MCG/ACT IN AERO Inhalation Inhale 2 puffs into the lungs 2 (two) times daily.      Marland Kitchen FLUTICASONE PROPIONATE 50 MCG/ACT NA SUSP Nasal Place 2 sprays into the nose daily.        BP 147/90  Pulse 67  Temp(Src) 98.1 F (36.7 C) (Oral)  Resp 16  Ht 6' (1.829 m)  Wt 200 lb (90.719 kg)  BMI  27.12 kg/m2  SpO2 98%  Physical Exam  Vitals reviewed. Constitutional: He appears well-developed and well-nourished.  HENT:  Head: Normocephalic and atraumatic.  Musculoskeletal: He exhibits edema and tenderness.       Swollen tender left elbow,  From,  nv and ns intact  Skin: There is erythema.  Psychiatric: He has a normal mood and affect.    ED Course  Procedures (including critical care time)  Labs Reviewed - No data to display Dg Elbow Complete Left  07/06/2011  *RADIOLOGY REPORT*  Clinical Data: Pain and swelling  LEFT ELBOW - COMPLETE 3+ VIEW  Comparison: None.  Findings: No discernible joint effusion.  No evidence of fracture, dislocation, degenerative change or other focal finding.  IMPRESSION: Negative radiographs  Original Report Authenticated By: Thomasenia Sales, M.D.     No diagnosis found.    MDM  Xray normal,   I suspect bursitis however this may be and early cellulitis.  I advised pt to follow up with Dr. Pearletha Forge for recheck in 2 days.        Langston Masker, [place] 07/06/11 1529

## 2011-07-07 NOTE — ED Provider Notes (Signed)
History/physical exam/procedure(s) were performed by non-physician practitioner and as supervising physician I was immediately available for consultation/collaboration. I have reviewed all notes and am in agreement with care and plan.  Makyiah Lie S Lakitha Gordy, MD 07/07/11 0909 

## 2011-07-08 ENCOUNTER — Ambulatory Visit (INDEPENDENT_AMBULATORY_CARE_PROVIDER_SITE_OTHER): Payer: BC Managed Care – PPO | Admitting: Internal Medicine

## 2011-07-08 ENCOUNTER — Encounter: Payer: Self-pay | Admitting: Internal Medicine

## 2011-07-08 DIAGNOSIS — IMO0002 Reserved for concepts with insufficient information to code with codable children: Secondary | ICD-10-CM

## 2011-07-08 NOTE — Progress Notes (Signed)
  Subjective:    Patient ID: Brett Berry, male    DOB: March 23, 1951, 60 y.o.   MRN: [id number]  HPI Developed redness and pain in the left forearm on Monday. He was seen in ED  [address] - note reveiwed. He was started on keflex and doxy for possible cellulitis but the dx at the time was bursitis. The arm is still red and this has spread proximally to above the elbow. He has had no fever, chills, does not feel toxic.   Past Medical History  Diagnosis Date  . Asthma   . Sinusitis, chronic   . Allergic rhinitis    Past Surgical History  Procedure Date  . Sinus endo with fusion   . Mastoidectomy   . Right hand surg     for infection in '07   No family history on file. History   Social History  . Marital Status: Married    Spouse Name: N/A    Number of Children: N/A  . Years of Education: N/A   Occupational History  . Not on file.   Social History Main Topics  . Smoking status: Never Smoker   . Smokeless tobacco: Not on file  . Alcohol Use: No  . Drug Use: No  . Sexually Active: No   Other Topics Concern  . Not on file   Social History Narrative   HSGMarried '76- 6 years divorced ; '851 son- '86Work: Managing knit department       Review of Systems System review is negative for any constitutional, cardiac, pulmonary, GI or neuro symptoms or complaints other than as described in the HPI.     Objective:   Physical Exam Vitals afebrile Derm - left arm is red, warm, no fluctuance. Nodes - no trochlear or axillary nodes.        Assessment & Plan:  Cellultis - continue present antibiotics                For continued spread, fever, etc - in-pt treatment with vanco.  c [phone]

## 2011-07-08 NOTE — Patient Instructions (Signed)
Cellulitis - the redness and heat are consistent with a cellulits. There is no lymph node enlargement and there is no fever.   Plan - continue keflex and doxycycline          For FEVER or CHILLs or any swollen lymph nodes - call           I will call on Friday - if not better may need IV antibiotics.    Cellulitis Cellulitis is an infection of the skin and the tissue beneath it. The area is typically red and tender. It is caused by germs (bacteria) (usually staph or strep) that enter the body through cuts or sores. Cellulitis most commonly occurs in the arms or lower legs.   HOME CARE INSTRUCTIONS    If you are given a prescription for medications which kill germs (antibiotics), take as directed until finished.     If the infection is on the arm or leg, keep the limb elevated as able.     Use a warm cloth several times per day to relieve pain and encourage healing.     See your caregiver for recheck of the infected site as directed if problems arise.     Only take over-the-counter or prescription medicines for pain, discomfort, or fever as directed by your caregiver.  SEEK MEDICAL CARE IF:    The area of redness (inflammation) is spreading, there are red streaks coming from the infected site, or if a part of the infection begins to turn dark in color.     The joint or bone underneath the infected skin becomes painful after the skin has healed.     The infection returns in the same or another area after it seems to have gone away.     A boil or bump swells up. This may be an abscess.     New, unexplained problems such as pain or fever develop.  SEEK IMMEDIATE MEDICAL CARE IF:    You have a fever.     You or your child feels drowsy or lethargic.     There is vomiting, diarrhea, or lasting discomfort or feeling ill (malaise) with muscle aches and pains.  MAKE SURE YOU:    Understand these instructions.     Will watch your condition.     Will get help right away if you are not  doing well or get worse.  Document Released: 04/29/2005 Document Revised: 04/01/2011 Document Reviewed: 03/07/2008 [facility] Patient Information 2012 [place], [place].

## 2011-07-09 ENCOUNTER — Telehealth: Payer: Self-pay | Admitting: Internal Medicine

## 2011-07-09 NOTE — Telephone Encounter (Signed)
Called re: cellulitis left forearm - spoke with Mrs. Finigan. It is less red, less warm and feels better.

## 2011-07-09 NOTE — Telephone Encounter (Signed)
Message copied by Jacques Navy on Thu Jul 09, 2011  6:29 PM ------      Message from: Illene Regulus E      Created: Wed Jul 08, 2011  1:30 PM       Friday, December 7th about arm

## 2011-07-21 ENCOUNTER — Encounter: Payer: BC Managed Care – PPO | Admitting: Internal Medicine

## 2011-09-09 ENCOUNTER — Ambulatory Visit (INDEPENDENT_AMBULATORY_CARE_PROVIDER_SITE_OTHER): Payer: BC Managed Care – PPO | Admitting: Internal Medicine

## 2011-09-09 ENCOUNTER — Encounter: Payer: Self-pay | Admitting: Internal Medicine

## 2011-09-09 ENCOUNTER — Other Ambulatory Visit (INDEPENDENT_AMBULATORY_CARE_PROVIDER_SITE_OTHER): Payer: BC Managed Care – PPO

## 2011-09-09 VITALS — BP 132/70 | HR 58 | Temp 98.7°F | Resp 16 | Ht 71.0 in | Wt 195.5 lb

## 2011-09-09 DIAGNOSIS — Z0000 Encounter for general adult medical examination without abnormal findings: Secondary | ICD-10-CM

## 2011-09-09 DIAGNOSIS — T85618A Breakdown (mechanical) of other specified internal prosthetic devices, implants and grafts, initial encounter: Secondary | ICD-10-CM

## 2011-09-09 DIAGNOSIS — Z1211 Encounter for screening for malignant neoplasm of colon: Secondary | ICD-10-CM

## 2011-09-09 DIAGNOSIS — J45909 Unspecified asthma, uncomplicated: Secondary | ICD-10-CM

## 2011-09-09 DIAGNOSIS — R1310 Dysphagia, unspecified: Secondary | ICD-10-CM

## 2011-09-09 DIAGNOSIS — Z23 Encounter for immunization: Secondary | ICD-10-CM

## 2011-09-09 DIAGNOSIS — R7989 Other specified abnormal findings of blood chemistry: Secondary | ICD-10-CM

## 2011-09-09 DIAGNOSIS — G4700 Insomnia, unspecified: Secondary | ICD-10-CM

## 2011-09-09 LAB — COMPREHENSIVE METABOLIC PANEL
ALT: 23 U/L (ref 0–53)
AST: 19 U/L (ref 0–37)
Albumin: 3.9 g/dL (ref 3.5–5.2)
CO2: 30 mEq/L (ref 19–32)
Calcium: 9.2 mg/dL (ref 8.4–10.5)
Chloride: 105 mEq/L (ref 96–112)
GFR: 81.34 mL/min (ref >60.00)
Potassium: 4.6 mEq/L (ref 3.5–5.1)
Sodium: 140 mEq/L (ref 135–145)
Total Protein: 6.9 g/dL (ref 6.0–8.3)

## 2011-09-09 LAB — LDL CHOLESTEROL, DIRECT: Direct LDL: 162.7 mg/dL

## 2011-09-09 LAB — LIPID PANEL
Total CHOL/HDL Ratio: 4
VLDL: 12 mg/dL (ref 0.0–40.0)

## 2011-09-09 NOTE — Progress Notes (Signed)
Subjective:    Patient ID: Brett Berry, male    DOB: 1951/04/24, 61 y.o.   MRN: [id number]  HPI Brett Berry presents routine medical follow-up. He is feeling run down, low energy but he is working 80+ hours a week. He is also having leg cramps. No other major illness, no injury, surgery except for retrieval of a tympanostomy tube.   Past Medical History  Diagnosis Date  . Asthma   . Sinusitis, chronic   . Allergic rhinitis    Past Surgical History  Procedure Date  . Sinus endo with fusion   . Mastoidectomy   . Right hand surg     for infection in '07   Family History  Problem Relation Age of Onset  . COPD Mother   . Alzheimer's disease Father   . Cancer Father     lung, prostate   History   Social History  . Marital Status: Married    Spouse Name: N/A    Number of Children: N/A  . Years of Education: N/A   Occupational History  . Not on file.   Social History Main Topics  . Smoking status: Former Smoker    Quit date: 09/08/1973  . Smokeless tobacco: Never Used  . Alcohol Use: Yes     rare beer  . Drug Use: No  . Sexually Active: Yes -- Male partner(s)   Other Topics Concern  . Not on file   Social History Narrative   HSG. Married '76- 6 years divorced ; '85. 1 son- '86. Work: Retail banker department      Review of Systems Constitutional:  Negative for fever, chills, activity change and unexpected weight change.  HEENT:  Positive for hearing loss, ear pain, congestion, neck stiffness and postnasal drip. Negative for sore throat. Positive for swallowing problems -  Solid food dysphagia. Negative for dental complaints.   Eyes: Negative for vision loss or change in visual acuity.  Respiratory: Negative for chest tightness and wheezing. Negative for DOE.   Cardiovascular: Negative for chest pain or palpitations. No decreased exercise tolerance Gastrointestinal: No change in bowel habit. No bloating or gas. No reflux or indigestion Genitourinary: Negative  for urgency, frequency, flank pain and difficulty urinating.  Musculoskeletal: Negative for myalgias, back pain, arthralgias and gait problem.  Neurological: Negative for dizziness, tremors, weakness and headaches.  Hematological: Negative for adenopathy.  Psychiatric/Behavioral: Negative for behavioral problems and dysphoric mood.       Objective:   Physical Exam Filed Vitals:   09/09/11 0904  BP: 132/70  Pulse: 58  Temp: 98.7 F (37.1 C)  Resp: 16   Gen'l: Well nourished well developed white male in no acute distress  HEENT: Head: Normocephalic and atraumatic. Right Ear: External ear normal. EAC/TM small chronic perforation posterior inferior aspect of TM. Left Ear: External ear normal.  EAC/TM chronic perforation inferior aspect TM. Nose: Nose normal. Mouth/Throat: Oropharynx is clear and moist. Dentition - native, in good repair. No buccal or palatal lesions. Posterior pharynx clear. Eyes: Conjunctivae and sclera clear. EOM intact. Pupils are equal, round, and reactive to light. Right eye exhibits no discharge. Left eye exhibits no discharge. Neck: Normal range of motion. Neck supple. No JVD present. No tracheal deviation present. No thyromegaly present.  Cardiovascular: Normal rate, regular rhythm, no gallop, no friction rub, no murmur heard.      Quiet precordium. 2+ radial and DP pulses . No carotid bruits Pulmonary/Chest: Effort normal. No respiratory distress or increased WOB, no wheezes, no  rales. No chest wall deformity or CVAT. Abdominal: Soft. Bowel sounds are normal in all quadrants. He exhibits no distension, no tenderness, no rebound or guarding, No heptosplenomegaly  Genitourinary:  deferred to PSA Musculoskeletal: Normal range of motion. He exhibits no edema and no tenderness.       Small and large joints without redness, synovial thickening or deformity. Full range of motion preserved about all small, median and large joints.  Lymphadenopathy:    He has no cervical or  supraclavicular adenopathy.  Neurological: He is alert and oriented to person, place, and time. CN II-XII intact. DTRs 2+ and symmetrical biceps, radial and patellar tendons. Cerebellar function normal with no tremor, rigidity, normal gait and station.  Skin: Skin is warm and dry. No rash noted. No erythema.  Psychiatric: He has a normal mood and affect. His behavior is normal. Thought content normal.  Lab Results  Component Value Date   WBC 8.5 08/23/2010   HGB 13.8 08/23/2010   HCT 39.2 08/23/2010   PLT 234 08/23/2010   GLUCOSE 94 09/09/2011   CHOL 235* 09/09/2011   TRIG 60.0 09/09/2011   HDL 57.00 09/09/2011   LDLDIRECT 162.7 09/09/2011   ALT 23 09/09/2011   AST 19 09/09/2011   NA 140 09/09/2011   K 4.6 09/09/2011   CL 105 09/09/2011   CREATININE 1.0 09/09/2011   BUN 23 09/09/2011   CO2 30 09/09/2011   TSH 2.61 12/19/2008   PSA 0.98 09/09/2011         Assessment & Plan:

## 2011-09-10 DIAGNOSIS — Z0000 Encounter for general adult medical examination without abnormal findings: Secondary | ICD-10-CM | POA: Insufficient documentation

## 2011-09-10 DIAGNOSIS — R1310 Dysphagia, unspecified: Secondary | ICD-10-CM | POA: Insufficient documentation

## 2011-09-10 DIAGNOSIS — T85618A Breakdown (mechanical) of other specified internal prosthetic devices, implants and grafts, initial encounter: Secondary | ICD-10-CM | POA: Insufficient documentation

## 2011-09-10 MED ORDER — SIMVASTATIN 20 MG PO TABS: 20.0000 mg | ORAL_TABLET | Freq: Every day | ORAL | Status: DC

## 2011-09-10 NOTE — Assessment & Plan Note (Signed)
Interval medical history is remarkable for recurrent problems with ears. Asthma is controlled. Physical exam is normal. Lab results are normal except for elevated LDL cholesterol. He is given a Tdap today. He will be scheduled for colonoscopy for routine colorectal cancer screening.  In summary - a pleasant gentleman who is medically stable except for need to treat hyperlipidemia and to have evaluation of dysphagia. He is referred as noted. New Rx for simvastatin sent to pharmacy and lab orders for follow up in 1 month are entered. Will be in touch to follow up on cholesterol management.

## 2011-09-10 NOTE — Assessment & Plan Note (Signed)
LDL cholesterol is above the treatment threshold - greater then 160.  Plan - recommend initiating "statin" therapy - simvastatin 20 mg once a day. Rx to pharmacy. Follow-up lab in 1 month.

## 2011-09-10 NOTE — Assessment & Plan Note (Signed)
Continues to follow at [facility]. He has been well controlled.  Plan- continue present medications.

## 2011-09-10 NOTE — Assessment & Plan Note (Signed)
Long term chronic problem. Discussed need to avoid stimulants. Briefly touched on sleep hygiene

## 2011-09-10 NOTE — Assessment & Plan Note (Signed)
He reports that he has solid food dysphagia and he has had to induce regurgitation on several occasions due to choking.  Plan - refer to GI

## 2011-09-14 ENCOUNTER — Encounter: Payer: Self-pay | Admitting: Internal Medicine

## 2011-09-23 ENCOUNTER — Encounter: Payer: Self-pay | Admitting: Gastroenterology

## 2011-09-23 ENCOUNTER — Ambulatory Visit (INDEPENDENT_AMBULATORY_CARE_PROVIDER_SITE_OTHER): Payer: BC Managed Care – PPO | Admitting: Gastroenterology

## 2011-09-23 DIAGNOSIS — R1310 Dysphagia, unspecified: Secondary | ICD-10-CM

## 2011-09-23 DIAGNOSIS — Z1211 Encounter for screening for malignant neoplasm of colon: Secondary | ICD-10-CM

## 2011-09-23 MED ORDER — ESOMEPRAZOLE MAGNESIUM 40 MG PO CPDR: 40.0000 mg | DELAYED_RELEASE_CAPSULE | Freq: Every day | ORAL | Status: DC

## 2011-09-23 MED ORDER — PEG-KCL-NACL-NASULF-NA ASC-C 100 G PO SOLR: 1.0000 | ORAL | Status: DC

## 2011-09-23 NOTE — Progress Notes (Signed)
HPI: This is a   very pleasant 61 year old man whom I am meeting for the first time today.  He is here to discuss screening colonoscopy and also mild intermittent solid food nonprogressive dysphagia. He says sometimes he will have to force himself to regurgitate to get a solid food bolus out, this occurs about 2-3 times per month. He has had no weight loss and no overt GI bleeding. Bowels are on changed for him over many years, he is not bothered by constipation or diarrhea or significant abdominal pains.   Review of systems: Pertinent positive and negative review of systems were noted in the above HPI section. Complete review of systems was performed and was otherwise normal.    Past Medical History  Diagnosis Date  . Asthma   . Sinusitis, chronic   . Allergic rhinitis   . Hearing difficulty     Past Surgical History  Procedure Date  . Sinus endo with fusion   . Mastoidectomy     right   . Right hand surg     for infection in '07    Current Outpatient Prescriptions  Medication Sig Dispense Refill  . budesonide-formoterol (SYMBICORT) 160-4.5 MCG/ACT inhaler Inhale 2 puffs into the lungs 2 (two) times daily.        . fluticasone (FLONASE) 50 MCG/ACT nasal spray Place 2 sprays into the nose daily.        Marland Kitchen levalbuterol (XOPENEX HFA) 45 MCG/ACT inhaler Inhale 1-2 puffs into the lungs as needed.      . simvastatin (ZOCOR) 20 MG tablet Take 1 tablet (20 mg total) by mouth at bedtime.  30 tablet  3    Allergies as of 09/23/2011  . (No Known Allergies)    Family History  Problem Relation Age of Onset  . COPD Mother   . Alzheimer's disease Father   . Lung cancer Father     smoker  . Prostate cancer Father   . Breast cancer Mother   . Heart disease Mother   . Colon cancer Neg Hx     History   Social History  . Marital Status: Married    Spouse Name: N/A    Number of Children: 1  . Years of Education: N/A   Occupational History  . manage nitting dept    Social  History Main Topics  . Smoking status: Former Smoker    Quit date: 09/08/1973  . Smokeless tobacco: Never Used  . Alcohol Use: Yes     2-3 times week  . Drug Use: No  . Sexually Active: Yes -- Male partner(s)   Other Topics Concern  . Not on file   Social History Narrative   HSG. Married '76- 6 years divorced ; '85. 1 son- '86. Work: Art gallery manager       Physical Exam: BP 130/70  Pulse 60  Ht 5\' 11"  (1.803 m)  Wt 200 lb 9.6 oz (90.992 kg)  BMI 27.98 kg/m2 Constitutional: generally well-appearing Psychiatric: alert and oriented x3 Eyes: extraocular movements intact Mouth: oral pharynx moist, no lesions Neck: supple no lymphadenopathy Cardiovascular: heart regular rate and rhythm Lungs: clear to auscultation bilaterally Abdomen: soft, nontender, nondistended, no obvious ascites, no peritoneal signs, normal bowel sounds Extremities: no lower extremity edema bilaterally Skin: no lesions on visible extremities    Assessment and plan: 60 y.o. male with  mild, nonprogressive solid food dysphagia; routine risk for colon cancer  He will try samples of proton pump inhibitor to see if this  helps his minor dysphagia. He does not have a high process however as it can sometimes cause edema without causing pyrosis, leading to dysphagia. We will proceed with EGD and also screening colonoscopy at the same time. I see no reason for any further blood tests or imaging studies prior to that.

## 2011-09-23 NOTE — Patient Instructions (Signed)
You will be set up for a colonoscopy. You will be set up for an upper endoscopy. Samples of PPI given, take one pill once daily shortly before breakfast meal. This MAY help with your swallowing trouble.

## 2011-09-25 ENCOUNTER — Encounter: Payer: Self-pay | Admitting: Gastroenterology

## 2011-09-25 ENCOUNTER — Ambulatory Visit (AMBULATORY_SURGERY_CENTER): Payer: BC Managed Care – PPO | Admitting: Gastroenterology

## 2011-09-25 VITALS — BP 139/81 | HR 58 | Temp 96.2°F | Resp 20 | Ht 71.0 in | Wt 200.0 lb

## 2011-09-25 DIAGNOSIS — D126 Benign neoplasm of colon, unspecified: Secondary | ICD-10-CM

## 2011-09-25 DIAGNOSIS — Z1211 Encounter for screening for malignant neoplasm of colon: Secondary | ICD-10-CM

## 2011-09-25 DIAGNOSIS — R1310 Dysphagia, unspecified: Secondary | ICD-10-CM

## 2011-09-25 MED ORDER — ESOMEPRAZOLE MAGNESIUM 40 MG PO CPDR: 40.0000 mg | DELAYED_RELEASE_CAPSULE | Freq: Two times a day (BID) | ORAL | Status: DC

## 2011-09-25 MED ORDER — SODIUM CHLORIDE 0.9 % IV SOLN: 500.0000 mL | INTRAVENOUS | Status: DC

## 2011-09-25 NOTE — Progress Notes (Signed)
Patient did not experience any of the following events: a burn prior to discharge; a fall within the facility; wrong site/side/patient/procedure/implant event; or a hospital transfer or hospital admission upon discharge from the facility. (G8907) Patient did not have preoperative order for IV antibiotic SSI prophylaxis. (G8918)  

## 2011-09-25 NOTE — Op Note (Signed)
[facility] [address] [place], [place]  [address]  COLONOSCOPY PROCEDURE REPORT  PATIENT:  Brett Berry, Brett Berry  MR#:  [id number] BIRTHDATE:  26-Jul-1951, 60 yrs. old  GENDER:  male ENDOSCOPIST:  Rachael Fee, MD REF. BY:  Rosalyn Gess. Norins, M.D. PROCEDURE DATE:  09/25/2011 PROCEDURE:  Colonoscopy with biopsy ASA CLASS:  Class II INDICATIONS:  Routine Risk Screening MEDICATIONS:   Fentanyl 100 mcg IV, These medications were titrated to patient response per physician's verbal order, Versed 10 mg IV  DESCRIPTION OF PROCEDURE:   After the risks benefits and alternatives of the procedure were thoroughly explained, informed consent was obtained.  Digital rectal exam was performed and revealed no rectal masses.   The LB [id number]-H180AL [id number] endoscope was introduced through the anus and advanced to the cecum, which was identified by both the appendix and ileocecal valve, without limitations.  The quality of the prep was good..  The instrument was then slowly withdrawn as the colon was fully examined. <<PROCEDUREIMAGES>> FINDINGS:  A sessile polyp was found in the ascending colon. This was removed with forceps, measured 3mm across, sent to pathology (jar 1) (see image3).  Mild diverticulosis was found in the sigmoid to descending colon segments (see image1).  This was otherwise a normal examination of the colon (see image2 and image5).   Retroflexed views in the rectum revealed no abnormalities. COMPLICATIONS:  None  ENDOSCOPIC IMPRESSION: 1) Sessile polyp in the ascending colon, removed and sent to pathology 2) Mild diverticulosis in the sigmoid to descending colon segments 3) Otherwise normal examination  RECOMMENDATIONS: 1) If the polyp(s) removed today are proven to be adenomatous (pre-cancerous) polyps, you will need a repeat colonoscopy in 5 years. Otherwise you should continue to follow colorectal cancer screening guidelines for "routine risk" patients with  colonoscopy in 10 years. You will receive a letter within 1-2 weeks with the results of your biopsy as well as final recommendations. Please call my office if you have not received a letter after 3 weeks.  ______________________________ Rachael Fee, MD  n. eSIGNED:   Rachael Fee at 09/25/2011 03:27 PM  Otho Najjar, [phone]

## 2011-09-25 NOTE — Patient Instructions (Addendum)

## 2011-09-25 NOTE — Op Note (Signed)
[facility] [address] [place], [place]  [address]  ENDOSCOPY PROCEDURE REPORT  PATIENT:  Brett Berry, Brett Berry  MR#:  [id number] BIRTHDATE:  04-14-1951, 60 yrs. old  GENDER:  male ENDOSCOPIST:  Rachael Fee, MD PROCEDURE DATE:  09/25/2011 PROCEDURE:  EGD, diagnostic [phone] ASA CLASS:  Class II INDICATIONS:  dysphagia, mild and intermittent MEDICATIONS:  There was residual sedation effect present from prior procedure., These medications were titrated to patient response per physician's verbal order, Versed 2 mg IV TOPICAL ANESTHETIC:  Cetacaine Spray  DESCRIPTION OF PROCEDURE:   After the risks benefits and alternatives of the procedure were thoroughly explained, informed consent was obtained.  The LB GIF-H180 [id number] endoscope was introduced through the mouth and advanced to the second portion of the duodenum, without limitations.  The instrument was slowly withdrawn as the mucosa was fully examined. <<PROCEDUREIMAGES>> There were three linear erosions, each 2-3cm long, at GE junction and running proximally. This was above a small hiatal hernia (see image5). There was minor narrowing of GE jucntion, I felt likely related to GERD related inflammation/edema rather than fixed stricture.  Otherwise the examination was normal (see image4, image3, image1, and image2).    Retroflexed views revealed no abnormalities.    The scope was then withdrawn from the patient and the procedure completed. COMPLICATIONS:  None  ENDOSCOPIC IMPRESSION: 1) Erosive esophagitis 2) Otherwise normal examination  RECOMMENDATIONS: A new prescription for PPI (antiacid medicine) was called into your pharmacy. Take one pill twice a day for 1 month, then OK to decrease to once a day.  Please call Dr. Christella Hartigan' office in 2 months if the swallowing difficulty persists after that. ______________________________ Rachael Fee, MD  cc: Wyonia Hough, MD  n. eSIGNED:   Rachael Fee at 09/25/2011  03:38 PM  Otho Najjar, [phone]

## 2011-09-28 ENCOUNTER — Telehealth: Payer: Self-pay | Admitting: *Deleted

## 2011-09-28 NOTE — Telephone Encounter (Signed)
  Follow up Call-  Call back number 09/25/2011  Post procedure Call Back phone  # [phone]  Permission to leave phone message Yes     Patient questions:  Do you have a fever, pain , or abdominal swelling? no Pain Score  0 *  Have you tolerated food without any problems? yes  Have you been able to return to your normal activities? yes  Do you have any questions about your discharge instructions: Diet   no Medications  no Follow up visit  no  Do you have questions or concerns about your Care? no  Actions: * If pain score is 4 or above: No action needed, pain <4.

## 2011-09-30 ENCOUNTER — Encounter: Payer: Self-pay | Admitting: Gastroenterology

## 2012-02-20 ENCOUNTER — Other Ambulatory Visit: Payer: Self-pay | Admitting: Internal Medicine

## 2012-07-16 ENCOUNTER — Other Ambulatory Visit: Payer: Self-pay | Admitting: Gastroenterology

## 2012-08-13 ENCOUNTER — Other Ambulatory Visit: Payer: Self-pay | Admitting: Internal Medicine

## 2012-08-15 ENCOUNTER — Other Ambulatory Visit: Payer: Self-pay | Admitting: *Deleted

## 2012-08-15 MED ORDER — SIMVASTATIN 20 MG PO TABS: 20.0000 mg | ORAL_TABLET | Freq: Every day | ORAL | Status: DC

## 2012-12-15 ENCOUNTER — Other Ambulatory Visit: Payer: Self-pay | Admitting: Internal Medicine

## 2012-12-29 ENCOUNTER — Other Ambulatory Visit (INDEPENDENT_AMBULATORY_CARE_PROVIDER_SITE_OTHER): Payer: BC Managed Care – PPO

## 2012-12-29 ENCOUNTER — Ambulatory Visit (INDEPENDENT_AMBULATORY_CARE_PROVIDER_SITE_OTHER): Payer: BC Managed Care – PPO | Admitting: Internal Medicine

## 2012-12-29 ENCOUNTER — Encounter: Payer: Self-pay | Admitting: Internal Medicine

## 2012-12-29 VITALS — BP 142/70 | HR 59 | Temp 98.4°F | Ht 71.0 in | Wt 187.8 lb

## 2012-12-29 DIAGNOSIS — E785 Hyperlipidemia, unspecified: Secondary | ICD-10-CM

## 2012-12-29 DIAGNOSIS — R361 Hematospermia: Secondary | ICD-10-CM

## 2012-12-29 LAB — HEPATIC FUNCTION PANEL
ALT: 20 U/L (ref 0–53)
AST: 16 U/L (ref 0–37)
Albumin: 3.7 g/dL (ref 3.5–5.2)
Alkaline Phosphatase: 71 U/L (ref 39–117)

## 2012-12-29 LAB — LIPID PANEL
Cholesterol: 208 mg/dL — ABNORMAL HIGH (ref 0–200)
Total CHOL/HDL Ratio: 4

## 2012-12-29 NOTE — Progress Notes (Signed)
  Subjective:    Patient ID: Brett Berry, male    DOB: January 03, 1951, 62 y.o.   MRN: [id number]  HPI Mr. Mcguiness presents with a history of two episodes of hemospermia without any assoicated symptoms or erectile function. Last PSA Feb '13 0.97. He has no other complaints.  He has stopped zocor due to myalgias. LDL was 162 prior to medication. He has changed his diet and prefers being retested prior to restarting any medical therapy.   PMH, FamHx and SocHx reviewed for any changes and relevance.  Current Outpatient Prescriptions on File Prior to Visit  Medication Sig Dispense Refill  . budesonide-formoterol (SYMBICORT) 160-4.5 MCG/ACT inhaler Inhale 2 puffs into the lungs 2 (two) times daily.        . fluticasone (FLONASE) 50 MCG/ACT nasal spray Place 2 sprays into the nose daily.        Marland Kitchen levalbuterol (XOPENEX HFA) 45 MCG/ACT inhaler Inhale 1-2 puffs into the lungs as needed.      Marland Kitchen NEXIUM 40 MG capsule TAKE ONE CAPSULE BY MOUTH TWICE A DAY BEFORE A MEAL  60 capsule  6  . simvastatin (ZOCOR) 20 MG tablet Take 1 tablet (20 mg total) by mouth at bedtime.  30 tablet  3   No current facility-administered medications on file prior to visit.      Review of Systems System review is negative for any constitutional, cardiac, pulmonary, GI or neuro symptoms or complaints other than as described in the HPI.      Objective:   Physical Exam Filed Vitals:   12/29/12 1103  BP: 142/70  Pulse: 59  Temp: 98.4 F (36.9 C)   Wt Readings from Last 3 Encounters:  12/29/12 187 lb 12.8 oz (85.186 kg)  09/25/11 200 lb (90.719 kg)  09/23/11 200 lb 9.6 oz (90.992 kg)   Gen'l - WNWD white man in no distress Cor- RRR Pulm - normal GU - no exam today.   Lipid panel: LDL 147, HDL 52, liver functions normal     Assessment & Plan:

## 2012-12-29 NOTE — Patient Instructions (Addendum)
Hemospermia - almost always a benign problem related to prostate inflammation. Your last PSA was 0.97 - stone cold normal.  Plan Watchful waiting: if you have recurrence of any symptoms will make referral to urology.  Cholesterol - will recheck and see where we are at. If there is a need for medication we can try alternative medications

## 2012-12-30 ENCOUNTER — Encounter: Payer: Self-pay | Admitting: Internal Medicine

## 2012-12-31 ENCOUNTER — Encounter: Payer: Self-pay | Admitting: Internal Medicine

## 2013-01-01 ENCOUNTER — Emergency Department (HOSPITAL_BASED_OUTPATIENT_CLINIC_OR_DEPARTMENT_OTHER)
Admission: EM | Admit: 2013-01-01 | Discharge: 2013-01-01 | Disposition: A | Discharge status: Home / Self Care | Payer: BC Managed Care – PPO | Attending: Emergency Medicine | Admitting: Emergency Medicine

## 2013-01-01 ENCOUNTER — Emergency Department (HOSPITAL_BASED_OUTPATIENT_CLINIC_OR_DEPARTMENT_OTHER): Payer: BC Managed Care – PPO

## 2013-01-01 ENCOUNTER — Encounter (HOSPITAL_BASED_OUTPATIENT_CLINIC_OR_DEPARTMENT_OTHER): Payer: Self-pay | Admitting: *Deleted

## 2013-01-01 ENCOUNTER — Other Ambulatory Visit: Payer: Self-pay | Admitting: Internal Medicine

## 2013-01-01 DIAGNOSIS — R059 Cough, unspecified: Secondary | ICD-10-CM | POA: Insufficient documentation

## 2013-01-01 DIAGNOSIS — J329 Chronic sinusitis, unspecified: Secondary | ICD-10-CM

## 2013-01-01 DIAGNOSIS — J3489 Other specified disorders of nose and nasal sinuses: Secondary | ICD-10-CM | POA: Insufficient documentation

## 2013-01-01 DIAGNOSIS — Z8709 Personal history of other diseases of the respiratory system: Secondary | ICD-10-CM | POA: Insufficient documentation

## 2013-01-01 DIAGNOSIS — R51 Headache: Secondary | ICD-10-CM | POA: Insufficient documentation

## 2013-01-01 DIAGNOSIS — J45909 Unspecified asthma, uncomplicated: Secondary | ICD-10-CM | POA: Insufficient documentation

## 2013-01-01 DIAGNOSIS — R361 Hematospermia: Secondary | ICD-10-CM

## 2013-01-01 DIAGNOSIS — R05 Cough: Secondary | ICD-10-CM | POA: Insufficient documentation

## 2013-01-01 DIAGNOSIS — R509 Fever, unspecified: Secondary | ICD-10-CM | POA: Insufficient documentation

## 2013-01-01 DIAGNOSIS — Z87891 Personal history of nicotine dependence: Secondary | ICD-10-CM | POA: Insufficient documentation

## 2013-01-01 DIAGNOSIS — J069 Acute upper respiratory infection, unspecified: Secondary | ICD-10-CM | POA: Insufficient documentation

## 2013-01-01 DIAGNOSIS — Z8669 Personal history of other diseases of the nervous system and sense organs: Secondary | ICD-10-CM | POA: Insufficient documentation

## 2013-01-01 DIAGNOSIS — Z79899 Other long term (current) drug therapy: Secondary | ICD-10-CM | POA: Insufficient documentation

## 2013-01-01 MED ORDER — AMOXICILLIN-POT CLAVULANATE 875-125 MG PO TABS: 1.0000 | ORAL_TABLET | Freq: Two times a day (BID) | ORAL | Status: DC

## 2013-01-01 NOTE — ED Notes (Signed)
C/o congestion, sore throat, fever, cough and chest wall pain for appx a week

## 2013-01-01 NOTE — ED Provider Notes (Signed)
History     CSN: [id number]  Arrival date & time 01/01/13  1124   First MD Initiated Contact with Patient 01/01/13 1147      Chief Complaint  Patient presents with  . Sore Throat    (Consider location/radiation/quality/duration/timing/severity/associated sxs/prior treatment) Patient is a 62 y.o. male presenting with pharyngitis and cough. The history is provided by the patient.  Sore Throat Associated symptoms include headaches. Pertinent negatives include no shortness of breath.  Cough Cough characteristics:  Productive and hacking Sputum characteristics:  Green and yellow Severity:  Moderate Onset quality:  Gradual Duration:  5 days Timing:  Constant Progression:  Worsening Chronicity:  Recurrent Smoker: no   Context: upper respiratory infection and weather changes   Context: not sick contacts   Relieved by:  Nothing Worsened by:  Activity Ineffective treatments:  Beta-agonist inhaler and cough suppressants Associated symptoms: chills, fever, headaches, sinus congestion and sore throat   Associated symptoms: no shortness of breath   Risk factors: recent travel   Risk factors comment:  Went to [facility] but have not left the [place]   Past Medical History  Diagnosis Date  . Asthma   . Sinusitis, chronic   . Allergic rhinitis   . Hearing difficulty     Past Surgical History  Procedure Laterality Date  . Sinus endo with fusion    . Mastoidectomy      right   . Right hand surg      for infection in '07    Family History  Problem Relation Age of Onset  . COPD Mother   . Alzheimer's disease Father   . Lung cancer Father     smoker  . Prostate cancer Father   . Breast cancer Mother   . Heart disease Mother   . Colon cancer Neg Hx     History  Substance Use Topics  . Smoking status: Former Smoker    Quit date: 09/08/1973  . Smokeless tobacco: Never Used  . Alcohol Use: 4.8 oz/week    8 Cans of beer per week     Comment: 2-3 times week      Review of  Systems  Constitutional: Positive for fever and chills.  HENT: Positive for sore throat.   Respiratory: Positive for cough. Negative for shortness of breath.   Neurological: Positive for headaches.  All other systems reviewed and are negative.    Allergies  Review of patient's allergies indicates no known allergies.  Home Medications   Current Outpatient Rx  Name  Route  Sig  Dispense  Refill  . amoxicillin-clavulanate (AUGMENTIN) 875-125 MG per tablet   Oral   Take 1 tablet by mouth 2 (two) times daily.   28 tablet   0   . budesonide-formoterol (SYMBICORT) 160-4.5 MCG/ACT inhaler   Inhalation   Inhale 2 puffs into the lungs 2 (two) times daily.           . fluticasone (FLONASE) 50 MCG/ACT nasal spray   Nasal   Place 2 sprays into the nose daily.           Marland Kitchen levalbuterol (XOPENEX HFA) 45 MCG/ACT inhaler   Inhalation   Inhale 1-2 puffs into the lungs as needed.         Marland Kitchen NEXIUM 40 MG capsule      TAKE ONE CAPSULE BY MOUTH TWICE A DAY BEFORE A MEAL   60 capsule   6   . simvastatin (ZOCOR) 20 MG tablet   Oral  Take 1 tablet (20 mg total) by mouth at bedtime.   30 tablet   3     PT NEEDS TO SCHEDULE PHYSICAL EXAM     BP 137/77  Pulse 77  Temp(Src) 98.9 F (37.2 C) (Oral)  Resp 20  Wt 187 lb (84.823 kg)  BMI 26.09 kg/m2  SpO2 95%  Physical Exam  Nursing note and vitals reviewed. Constitutional: He is oriented to person, place, and time. He appears well-developed and well-nourished. No distress.  HENT:  Head: Normocephalic and atraumatic.  Right Ear: Tympanic membrane is perforated. Tympanic membrane is not erythematous and not bulging.  Left Ear: Tympanic membrane and ear canal normal.  Nose: Mucosal edema, rhinorrhea and sinus tenderness present. Right sinus exhibits frontal sinus tenderness. Right sinus exhibits no maxillary sinus tenderness. Left sinus exhibits frontal sinus tenderness. Left sinus exhibits no maxillary sinus tenderness.   Mouth/Throat: Mucous membranes are normal. Posterior oropharyngeal erythema present. No oropharyngeal exudate, posterior oropharyngeal edema or tonsillar abscesses.  Eyes: Conjunctivae and EOM are normal. Pupils are equal, round, and reactive to light.  Neck: Normal range of motion. Neck supple.  Cardiovascular: Normal rate, regular rhythm and intact distal pulses.   No murmur heard. Pulmonary/Chest: Effort normal and breath sounds normal. No respiratory distress. He has no wheezes. He has no rales.  Musculoskeletal: Normal range of motion. He exhibits no edema and no tenderness.  Lymphadenopathy:    He has no cervical adenopathy.  Neurological: He is alert and oriented to person, place, and time.  Skin: Skin is warm and dry. No rash noted. No erythema.  Psychiatric: He has a normal mood and affect. His behavior is normal.    ED Course  Procedures (including critical care time)  Labs Reviewed  RAPID STREP SCREEN  CULTURE, GROUP A STREP   Dg Chest 2 View  01/01/2013   *RADIOLOGY REPORT*  Clinical Data: Fever, cough  CHEST - 2 VIEW  Comparison: 08/23/2010  Findings: Mild perihilar interstitial infiltrates.  No confluent airspace consolidation.  No effusion.  Heart size normal.  Regional bones unremarkable.  IMPRESSION:  Mild perihilar interstitial infiltrates   Original Report Authenticated By: D. Andria Rhein, MD     1. Sinusitis   2. URI, acute       MDM   Pt with symptoms consistent with URI and sinusitis with persistent waxing and waning fever for 5 days.  Well appearing here.  No signs of breathing difficulty  No signs of pharyngitis, otitis or abnormal abdominal findings.   CXR and rapid strep wnl and pt to return with any further problems. Pt given augmentin and to f/u with PCP this week.         Gwyneth Sprout, MD 01/01/13 1224

## 2013-01-01 NOTE — Assessment & Plan Note (Signed)
Two episodes of hemospermia with other associated symptoms. No rectal pain, pain with ejaculation, dysuria, enlarged lymph nodes. He has not had hematuria. Reassured about the benign nature of hemospermia in most men. He has had normal PSA 0.98 Feb '13.  PLan Patient education - chpt from UpToDate provided  For recurrence - urology consul

## 2013-01-01 NOTE — Assessment & Plan Note (Signed)
Has had elevated LDL in the past but was intolerant of statins.  Plan  Lipid panel with recommendations to follow  Addendum: LDL 147 - below the treatment threshold for low-moderate cardiac risk patients  Plan Continue life-style management: low fat diet, regular aerobic exercise.

## 2013-01-03 LAB — CULTURE, GROUP A STREP

## 2013-05-15 ENCOUNTER — Other Ambulatory Visit: Payer: Self-pay | Admitting: Internal Medicine

## 2013-06-08 ENCOUNTER — Other Ambulatory Visit: Payer: Self-pay

## 2013-12-28 ENCOUNTER — Telehealth: Payer: Self-pay

## 2013-12-28 NOTE — Telephone Encounter (Signed)
Pt at work. Wife answered the phone.  Stated that husband would be almost impossible to reach at work.  Wife confirmed that patient would indeed make his appointment tomorrow.   Td- 09/09/11 Z CCS- 09/25/11-adenomatous polyps and diverticulosis; repeat in 5 years (09/2016) PSA- 09/09/11--0.98

## 2013-12-29 ENCOUNTER — Encounter: Payer: Self-pay | Admitting: Internal Medicine

## 2013-12-29 ENCOUNTER — Ambulatory Visit (INDEPENDENT_AMBULATORY_CARE_PROVIDER_SITE_OTHER): Payer: PRIVATE HEALTH INSURANCE | Admitting: Internal Medicine

## 2013-12-29 VITALS — BP 135/76 | HR 57 | Temp 98.2°F | Ht 71.0 in | Wt 186.0 lb

## 2013-12-29 DIAGNOSIS — Z1211 Encounter for screening for malignant neoplasm of colon: Secondary | ICD-10-CM

## 2013-12-29 DIAGNOSIS — Z0000 Encounter for general adult medical examination without abnormal findings: Secondary | ICD-10-CM | POA: Insufficient documentation

## 2013-12-29 DIAGNOSIS — K219 Gastro-esophageal reflux disease without esophagitis: Secondary | ICD-10-CM

## 2013-12-29 DIAGNOSIS — Z23 Encounter for immunization: Secondary | ICD-10-CM

## 2013-12-29 DIAGNOSIS — R361 Hematospermia: Secondary | ICD-10-CM

## 2013-12-29 DIAGNOSIS — E785 Hyperlipidemia, unspecified: Secondary | ICD-10-CM

## 2013-12-29 DIAGNOSIS — J45909 Unspecified asthma, uncomplicated: Secondary | ICD-10-CM

## 2013-12-29 LAB — COMPREHENSIVE METABOLIC PANEL
ALT: 19 U/L (ref 0–53)
AST: 18 U/L (ref 0–37)
Albumin: 3.9 g/dL (ref 3.5–5.2)
Alkaline Phosphatase: 62 U/L (ref 39–117)
BUN: 18 mg/dL (ref 6–23)
CO2: 27 meq/L (ref 19–32)
Calcium: 9.2 mg/dL (ref 8.4–10.5)
Chloride: 106 mEq/L (ref 96–112)
Creatinine, Ser: 0.9 mg/dL (ref 0.4–1.5)
GFR: 91.14 mL/min (ref >60.00)
Glucose, Bld: 87 mg/dL (ref 70–99)
Potassium: 4.2 mEq/L (ref 3.5–5.1)
Sodium: 138 mEq/L (ref 135–145)
Total Bilirubin: 1.2 mg/dL (ref 0.2–1.2)
Total Protein: 6.7 g/dL (ref 6.0–8.3)

## 2013-12-29 LAB — CBC WITH DIFFERENTIAL/PLATELET
BASOS PCT: 0.5 % (ref 0.0–3.0)
Basophils Absolute: 0 10*3/uL (ref 0.0–0.1)
EOS PCT: 8.4 % — AB (ref 0.0–5.0)
Eosinophils Absolute: 0.6 10*3/uL (ref 0.0–0.7)
HCT: 39.7 % (ref 39.0–52.0)
HEMOGLOBIN: 13.4 g/dL (ref 13.0–17.0)
LYMPHS ABS: 2.3 10*3/uL (ref 0.7–4.0)
Lymphocytes Relative: 33.1 % (ref 12.0–46.0)
MCHC: 33.7 g/dL (ref 30.0–36.0)
MCV: 94.6 fl (ref 78.0–100.0)
MONOS PCT: 5.6 % (ref 3.0–12.0)
Monocytes Absolute: 0.4 10*3/uL (ref 0.1–1.0)
Neutro Abs: 3.7 10*3/uL (ref 1.4–7.7)
Neutrophils Relative %: 52.4 % (ref 43.0–77.0)
Platelets: 240 10*3/uL (ref 150.0–400.0)
RBC: 4.19 Mil/uL — ABNORMAL LOW (ref 4.22–5.81)
RDW: 14.1 % (ref 11.5–15.5)
WBC: 7 10*3/uL (ref 4.0–10.5)

## 2013-12-29 LAB — LIPID PANEL
CHOL/HDL RATIO: 4
Cholesterol: 208 mg/dL — ABNORMAL HIGH (ref 0–200)
HDL: 53.7 mg/dL (ref >39.00)
LDL CALC: 141 mg/dL — AB (ref 0–99)
Triglycerides: 66 mg/dL (ref 0.0–149.0)
VLDL: 13.2 mg/dL (ref 0.0–40.0)

## 2013-12-29 LAB — TSH: TSH: 2.73 u[IU]/mL (ref 0.35–4.50)

## 2013-12-29 LAB — PSA: PSA: 1.55 ng/mL (ref 0.10–4.00)

## 2013-12-29 MED ORDER — BUDESONIDE-FORMOTEROL FUMARATE 160-4.5 MCG/ACT IN AERO: 2.0000 | INHALATION_SPRAY | Freq: Two times a day (BID) | RESPIRATORY_TRACT | Status: DC

## 2013-12-29 MED ORDER — CELECOXIB 100 MG PO CAPS: 100.0000 mg | ORAL_CAPSULE | Freq: Every day | ORAL | Status: DC | PRN

## 2013-12-29 MED ORDER — LEVALBUTEROL TARTRATE 45 MCG/ACT IN AERO: 1.0000 | INHALATION_SPRAY | RESPIRATORY_TRACT | Status: DC | PRN

## 2013-12-29 MED ORDER — ESOMEPRAZOLE MAGNESIUM 40 MG PO CPDR: DELAYED_RELEASE_CAPSULE | ORAL | Status: DC

## 2013-12-29 NOTE — Assessment & Plan Note (Signed)
Saw urology, told ok, no further sx

## 2013-12-29 NOTE — Assessment & Plan Note (Signed)
Check labs 

## 2013-12-29 NOTE — Patient Instructions (Signed)
Get your blood work before you leave     Next visit is for routine check up in 6 months  

## 2013-12-29 NOTE — Progress Notes (Signed)
Subjective:    Patient ID: Brett Berry, male    DOB: 1950/10/10, 63 y.o.   MRN: [id number]  DOS:  12/29/2013 Type of  Visit: new pt, used to see Dr Alma Friendly --->  CPX  ROS Diet-- eating healthy lately  Exercise-- very active No  CP, SOB Denies  nausea, vomiting diarrhea Denies  blood in the stools (-) cough, sputum production (-) wheezing, chest congestion Mild fatigue lately  No dysuria, gross hematuria, difficulty urinating  No anxiety, depression    Past Medical History  Diagnosis Date  . Asthma   . Sinusitis, chronic   . Allergic rhinitis   . Hearing difficulty   . Hyperlipidemia     intolerant to zocor    Past Surgical History  Procedure Laterality Date  . Sinus endo with fusion    . Mastoidectomy      right   . Right hand surg      for infection in '07    History   Social History  . Marital Status: Married    Spouse Name: N/A    Number of Children: 1  . Years of Education: N/A   Occupational History  . manage nitting dept    Social History Main Topics  . Smoking status: Former Smoker    Quit date: 09/08/1973  . Smokeless tobacco: Never Used  . Alcohol Use: 4.8 oz/week    8 Cans of beer per week     Comment: 2-3 times week  . Drug Use: No  . Sexual Activity: Yes    Partners: Female   Other Topics Concern  . Not on file   Social History Narrative   HSG. Married '76- 6 years divorced ; '85. 1 son- '86.    Lives w/ second wife of 83 years     Family History  Problem Relation Age of Onset  . COPD Mother   . Alzheimer's disease Father   . Lung cancer Father     smoker  . Prostate cancer Father     died age 26  . Breast cancer Mother   . Heart disease Mother   . Colon cancer Neg Hx        Medication List       This list is accurate as of: 12/29/13 11:59 PM.  Always use your most recent med list.               budesonide-formoterol 160-4.5 MCG/ACT inhaler  Commonly known as:  SYMBICORT  Inhale 2 puffs into the lungs 2 (two)  times daily.     celecoxib 100 MG capsule  Commonly known as:  CELEBREX  Take 1 capsule (100 mg total) by mouth daily as needed.     esomeprazole 40 MG capsule  Commonly known as:  NEXIUM  TAKE ONE CAPSULE BY MOUTH TWICE A DAY BEFORE A MEAL     fluticasone 50 MCG/ACT nasal spray  Commonly known as:  FLONASE  Place 2 sprays into the nose daily.     levalbuterol 45 MCG/ACT inhaler  Commonly known as:  XOPENEX HFA  Inhale 1-2 puffs into the lungs as needed.           Objective:   Physical Exam BP 135/76  Pulse 57  Temp(Src) 98.2 F (36.8 C) (Oral)  Ht 5\' 11"  (1.803 m)  Wt 186 lb (84.369 kg)  BMI 25.95 kg/m2  SpO2 95%  General -- alert, well-developed, NAD.  Neck --no thyromegaly   HEENT-- Not pale.  Lungs -- normal respiratory effort, no intercostal retractions, no accessory muscle use, and normal breath sounds.  Heart-- normal rate, regular rhythm, no murmur.  Abdomen-- Not distended, good bowel sounds,soft, non-tender. Rectal-- No external abnormalities noted. Normal sphincter tone. No rectal masses or tenderness. Brown stool   Prostate--Prostate gland firm and smooth, no enlargement, nodularity, tenderness, mass, asymmetry or induration. Extremities-- no pretibial edema bilaterally  Neurologic--  alert & oriented X3. Speech normal, gait normal, strength normal in all extremities.   Psych-- Cognition and judgment appear intact. Cooperative with normal attention span and concentration. No anxious or depressed appearing.        Assessment & Plan:

## 2013-12-29 NOTE — Progress Notes (Signed)
Pre-visit discussion using our clinic review tool. No additional management support is needed unless otherwise documented below in the visit note.  

## 2013-12-29 NOTE — Assessment & Plan Note (Addendum)
Good compliance with Symbicort, hardly ever uses xopenex

## 2013-12-29 NOTE — Assessment & Plan Note (Signed)
Td 2013 PNM shot 2013 prevnar today  CCS- 09/25/11-adenomatous polyps and diverticulosis; repeat in 5 years (09/2016) DRE -PSA today Diet -exercise discussed

## 2014-01-12 ENCOUNTER — Telehealth: Payer: Self-pay | Admitting: *Deleted

## 2014-01-12 NOTE — Telephone Encounter (Signed)
Prior authorization for celecoxib and esomeprazole initiated. Awaiting response. JG//CMA

## 2014-01-31 ENCOUNTER — Telehealth: Payer: Self-pay | Admitting: Internal Medicine

## 2014-01-31 NOTE — Telephone Encounter (Signed)
Pt notified of lab results

## 2014-01-31 NOTE — Telephone Encounter (Signed)
Caller name: nancy Relation to ID:[id number] Call back number:[phone]  Reason for call:  Pt would like his lab results.  Please contact at number listed above.  Thanks.

## 2014-01-31 NOTE — Telephone Encounter (Signed)
The results will release to mychart but these are my comments:  The LDL or bad cholesterol remains satisfactory at 141. All other labs including your prostate test -PSA- are normal. In summary these are good results, stay active and eat healthy!

## 2014-02-09 NOTE — Telephone Encounter (Signed)
Received denial letter from [employer] regarding the Esomeprazole 40mg .  [employer] is requesting more information to proceed with the request.  Called [employer] and spoke with the person who deals with prior authorizations and she stated that we will need do request an prior authorization appeal form and fill it out.  A faxed prior authorization appeal form was sent via fax, and placed in folder for Dr. Larose Kells to complete and sign.//AB/CMA

## 2014-02-09 NOTE — Telephone Encounter (Signed)
Received prior authorization appeal approval notice from [employer] for the Celecoxib via mail.  Called [facility] with the approval information.  Prior authorization effective/termination date:01/19/2014-01/20/2015.//AB/CMA    Received letter from [employer] needing additional information for the prior approval for the Celecoxib.  Letter placed in folder for Dr. Larose Kells to complete and sign.//AB/CMA

## 2014-02-13 ENCOUNTER — Encounter: Payer: Self-pay | Admitting: *Deleted

## 2014-02-13 NOTE — Telephone Encounter (Signed)
Received completed and signed prior authorization appeal form from Dr. Larose Kells.  All forms faxed to [place] Department at [phone]).  Confirmation received.  Awaiting response.//AB/CMA

## 2014-02-23 ENCOUNTER — Encounter: Payer: Self-pay | Admitting: Internal Medicine

## 2014-02-26 NOTE — Telephone Encounter (Signed)
Received letter that prior authorization was approved.  Approved through (02/13/2014-02/14/2015) and reference number:[id number].  Called [facility] and the pt has picked up rx.  Approval letter send for scanning.//AB/CMA

## 2014-03-30 ENCOUNTER — Ambulatory Visit (INDEPENDENT_AMBULATORY_CARE_PROVIDER_SITE_OTHER): Payer: PRIVATE HEALTH INSURANCE

## 2014-03-30 VITALS — BP 172/86 | HR 64 | Resp 12

## 2014-03-30 DIAGNOSIS — M19072 Primary osteoarthritis, left ankle and foot: Secondary | ICD-10-CM

## 2014-03-30 DIAGNOSIS — M19079 Primary osteoarthritis, unspecified ankle and foot: Secondary | ICD-10-CM

## 2014-03-30 DIAGNOSIS — M2022 Hallux rigidus, left foot: Secondary | ICD-10-CM

## 2014-03-30 DIAGNOSIS — R52 Pain, unspecified: Secondary | ICD-10-CM

## 2014-03-30 DIAGNOSIS — M2020 Hallux rigidus, unspecified foot: Secondary | ICD-10-CM

## 2014-03-30 NOTE — Progress Notes (Signed)
Subjective:    Patient ID: Brett Berry, male    DOB: 08/04/1950, 63 y.o.   MRN: [id number]  HPI PT STATED LT FOOT HAVE A KNOT AND ITS BEEN HURTING 14 YEARS. FOOT IS GETTING WORSE AND IT GET AGGRAVATED BY WEARING SHOES. TRIED NO TREATMENT.   Review of Systems  All other systems reviewed and are negative.      Objective:   Physical Exam Is a 63-year-old white male well-developed well-nourished oriented x3 vital signs stable. Patient has a painful great toe joint left with limited range of motion 120 with dorsal spurring medial spurring x-rays confirm asymmetric joint space narrowing dorsal spurring and osteophytic changes of the first MTP joint consistent with hallux limitus/hallux rigidus deformity. Partially objective findings reveal pedal pulses palpable DP +2/4 bilateral PT +24 bilateral capillary refill time 3 seconds all digits. Epicritic sensations intact and symmetric bilateral normal plantar response DTRs not listed neurologically skin color pigment and hair growth are normal nails somewhat criptotic otherwise unremarkable there is edema and erythema first MTP area left more so than right x-rays confirm arthritic change long first met with asymmetric joint space narrowing dorsal spurring and osteophyte and loss of articular cartilage there is pain and crepitus on range of motion and on palpation to       Assessment & Plan:  Assessment hallux rigidus/hallux with HAV deformity and capsulitis first MTP area left plan at this time discussed options patient's tried changing shoes pads cushions and at this time is it should surgical intervention we've talked about this for 10 or 15 years ago and he is ready to proceed with surgery at this time. At this time consent form for Keller bunionectomy with implant is reviewed patient will receive and encompass type hemi-implant of the great toe joint or first met head. We'll try to preserve the base of the phalanx if we find cartilage in adequate  condition and may do a cheilectomy and osteotomy and preserve the joint it is viable if not planus and may need a implant arthroplasty procedure. The consent form is reviewed and signed all questions asked by the patient are answered there no contraindications to surgery at this time understands she'll be in a surgical shoe for proxy 4 weeks medial return to work in one week for sitdown job. Consent signed surgery scheduled when patient ready next  Richard Sikora DPM 

## 2014-03-30 NOTE — Patient Instructions (Signed)
Pre-Operative Instructions  Congratulations, you have decided to take an important step to improving your quality of life.  You can be assured that the doctors of [facility] will be with you every step of the way.  1. Plan to be at the surgery center/hospital at least 1 (one) hour prior to your scheduled time unless otherwise directed by the surgical center/hospital staff.  You must have a responsible adult accompany you, remain during the surgery and drive you home.  Make sure you have directions to the surgical center/hospital and know how to get there on time. 2. For hospital based surgery you will need to obtain a history and physical form from your family physician within 1 month prior to the date of surgery- we will give you a form for you primary physician.  3. We make every effort to accommodate the date you request for surgery.  There are however, times where surgery dates or times have to be moved.  We will contact you as soon as possible if a change in schedule is required.   4. No Aspirin/Ibuprofen for one week before surgery.  If you are on aspirin, any non-steroidal anti-inflammatory medications (Mobic, Aleve, Ibuprofen) you should stop taking it 7 days prior to your surgery.  You make take Tylenol  For pain prior to surgery.  5. Medications- If you are taking daily heart and blood pressure medications, seizure, reflux, allergy, asthma, anxiety, pain or diabetes medications, make sure the surgery center/hospital is aware before the day of surgery so they may notify you which medications to take or avoid the day of surgery. 6. No food or drink after midnight the night before surgery unless directed otherwise by surgical center/hospital staff. 7. No alcoholic beverages 24 hours prior to surgery.  No smoking 24 hours prior to or 24 hours after surgery. 8. Wear loose pants or shorts- loose enough to fit over bandages, boots, and casts. 9. No slip on shoes, sneakers are best. 10. Bring  your boot with you to the surgery center/hospital.  Also bring crutches or a walker if your physician has prescribed it for you.  If you do not have this equipment, it will be provided for you after surgery. 11. If you have not been contracted by the surgery center/hospital by the day before your surgery, call to confirm the date and time of your surgery. 12. Leave-time from work may vary depending on the type of surgery you have.  Appropriate arrangements should be made prior to surgery with your employer. 13. Prescriptions will be provided immediately following surgery by your doctor.  Have these filled as soon as possible after surgery and take the medication as directed. 14. Remove nail polish on the operative foot. 15. Wash the night before surgery.  The night before surgery wash the foot and leg well with the antibacterial soap provided and water paying special attention to beneath the toenails and in between the toes.  Rinse thoroughly with water and dry well with a towel.  Perform this wash unless told not to do so by your physician.  Enclosed: 1 Ice pack (please put in freezer the night before surgery)   1 Hibiclens skin cleaner   Pre-op Instructions  If you have any questions regarding the instructions, do not hesitate to call our office.  [place]: [address] [place], [place] [address] [phone]  [place]: [address] , [place] [address] [phone]  [place]: [address]  [place], [place] [address] [phone]  Dr. Lorma Heater   Tuchman DPM, Dr. Norman Regal DPM Dr. Clevon Khader DPM, Dr. M. Todd Hyatt DPM, Dr. Kathryn Egerton DPM 

## 2014-07-03 ENCOUNTER — Ambulatory Visit: Payer: PRIVATE HEALTH INSURANCE | Admitting: Internal Medicine

## 2014-07-30 ENCOUNTER — Ambulatory Visit (INDEPENDENT_AMBULATORY_CARE_PROVIDER_SITE_OTHER): Payer: PRIVATE HEALTH INSURANCE | Admitting: Internal Medicine

## 2014-07-30 ENCOUNTER — Encounter: Payer: Self-pay | Admitting: Internal Medicine

## 2014-07-30 VITALS — BP 156/84 | HR 71 | Temp 98.1°F | Ht 71.0 in | Wt 187.5 lb

## 2014-07-30 DIAGNOSIS — J310 Chronic rhinitis: Secondary | ICD-10-CM

## 2014-07-30 DIAGNOSIS — J4520 Mild intermittent asthma, uncomplicated: Secondary | ICD-10-CM

## 2014-07-30 DIAGNOSIS — N529 Male erectile dysfunction, unspecified: Secondary | ICD-10-CM | POA: Insufficient documentation

## 2014-07-30 DIAGNOSIS — K219 Gastro-esophageal reflux disease without esophagitis: Secondary | ICD-10-CM

## 2014-07-30 DIAGNOSIS — Z0000 Encounter for general adult medical examination without abnormal findings: Secondary | ICD-10-CM

## 2014-07-30 HISTORY — DX: Male erectile dysfunction, unspecified: N52.9

## 2014-07-30 MED ORDER — BUDESONIDE-FORMOTEROL FUMARATE 160-4.5 MCG/ACT IN AERO: 2.0000 | INHALATION_SPRAY | Freq: Two times a day (BID) | RESPIRATORY_TRACT | Status: DC

## 2014-07-30 MED ORDER — ESOMEPRAZOLE MAGNESIUM 40 MG PO CPDR: DELAYED_RELEASE_CAPSULE | ORAL | Status: DC

## 2014-07-30 MED ORDER — TADALAFIL 20 MG PO TABS: 10.0000 mg | ORAL_TABLET | ORAL | Status: DC | PRN

## 2014-07-30 MED ORDER — CELECOXIB 100 MG PO CAPS: 100.0000 mg | ORAL_CAPSULE | Freq: Every day | ORAL | Status: DC | PRN

## 2014-07-30 NOTE — Progress Notes (Signed)
Subjective:    Patient ID: Brett Berry, male    DOB: [birth year], 63 y.o.   MRN: [id number]  DOS:  07/30/2014 Type of visit - description : f/u Interval history: Since the last time he was here he is doing well, good compliance with medication, needs refills. For the last 2 or 3 weeks he has noted on and off generalized itching, mild to moderate, no rash. Not  taking any new medications, has no new pets but he has switch detergents. BP today is elevated but  ambulatory BPs are 130/78 when checked at the store Also, has a history of ED, Cialis?  ROS Denies nausea, vomiting, diarrhea. No abdominal pain No dysuria, gross hematuria difficulty urinating  Past Medical History  Diagnosis Date  . Asthma   . Sinusitis, chronic   . Allergic rhinitis   . Hearing difficulty   . Hyperlipidemia     intolerant to zocor  . Erectile dysfunction 07/30/2014    Past Surgical History  Procedure Laterality Date  . Sinus endo with fusion    . Mastoidectomy      right   . Right hand surg      for infection in '07    History   Social History  . Marital Status: Married    Spouse Name: N/A    Number of Children: 1  . Years of Education: N/A   Occupational History  . manage nitting dept    Social History Main Topics  . Smoking status: Former Smoker    Quit date: 09/08/1973  . Smokeless tobacco: Never Used  . Alcohol Use: 4.8 oz/week    8 Cans of beer per week     Comment: 2-3 times week  . Drug Use: No  . Sexual Activity:    Partners: Female   Other Topics Concern  . Not on file   Social History Narrative   HSG. Married '76- 6 years divorced ; '85. 1 son- '86.    Lives w/ second wife of 30 years        Medication List       This list is accurate as of: 07/30/14  7:04 PM.  Always use your most recent med list.               budesonide-formoterol 160-4.5 MCG/ACT inhaler  Commonly known as:  SYMBICORT  Inhale 2 puffs into the lungs 2 (two) times daily.     celecoxib 100 MG capsule  Commonly known as:  CELEBREX  Take 1 capsule (100 mg total) by mouth daily as needed.     esomeprazole 40 MG capsule  Commonly known as:  NEXIUM  TAKE ONE CAPSULE BY MOUTH TWICE A DAY BEFORE A MEAL     fluticasone 50 MCG/ACT nasal spray  Commonly known as:  FLONASE  Place 2 sprays into the nose daily.     levalbuterol 45 MCG/ACT inhaler  Commonly known as:  XOPENEX HFA  Inhale 1-2 puffs into the lungs as needed.     tadalafil 20 MG tablet  Commonly known as:  CIALIS  Take 0.5-1 tablets (10-20 mg total) by mouth every other day as needed for erectile dysfunction.           Objective:   Physical Exam BP 156/84 mmHg  Pulse 71  Temp(Src) 98.1 F (36.7 C) (Oral)  Ht 5\' 11"  (1.803 m)  Wt 187 lb 8 oz (85.049 kg)  BMI 26.16 kg/m2  SpO2 97% General -- alert, well-developed, NAD.  HEENT-- Not pale or jaundice.   Lungs -- normal respiratory effort, no intercostal retractions, no accessory muscle use, and normal breath sounds.  Heart-- normal rate, regular rhythm, no murmur.  Abdomen-- Not distended, good bowel sounds,soft, non-tender. Extremities-- no pretibial edema bilaterally  Neurologic--  alert & oriented X3. Speech normal, gait appropriate for age, strength symmetric and appropriate for age.  Skin-- no rash, skin is not particularly dry Psych-- Cognition and judgment appear intact. Cooperative with normal attention span and concentration. No anxious or depressed appearing.     Assessment & Plan:   Generalized itching, Mild generalized itching without rash, no jaundice, has no GI symptoms. Not taking any new medication but he did change his detergent. For now, recommend observation, avoid very hot showers and use skin moisturizer; If he's not better in 3-4 weeks history let me know

## 2014-07-30 NOTE — Progress Notes (Signed)
Pre visit review using our clinic review tool, if applicable. No additional management support is needed unless otherwise documented below in the visit note. 

## 2014-07-30 NOTE — Assessment & Plan Note (Signed)
Likes to proceed with a Zostavax, is not available today, recommend to call us in 2 weeks. He could also get it at a local pharmacy.

## 2014-07-30 NOTE — Assessment & Plan Note (Signed)
In the past he tried Viagra but it caused headaches, wonders about Cialis. I not opposed to try. Side effects and how to use this medication discussed. Prescription provided.

## 2014-07-30 NOTE — Assessment & Plan Note (Signed)
We'll control, no change, refill Symbicort

## 2014-07-30 NOTE — Patient Instructions (Signed)
Check the  blood pressure 2 or 3 times a month  Be sure your blood pressure is between  145/85  and 110/65.  if it is consistently higher or lower, let me know     Please come back to the office in 6 months  for a physical exam. Come back fasting     Call in 2 or 3 weeks about Zostavax -the shingles shot-  or you could get it at your pharmacy

## 2014-08-01 ENCOUNTER — Other Ambulatory Visit: Payer: Self-pay | Admitting: Internal Medicine

## 2014-08-03 DIAGNOSIS — M25561 Pain in right knee: Secondary | ICD-10-CM

## 2014-08-03 HISTORY — PX: CATARACT EXTRACTION: SUR2

## 2014-08-03 HISTORY — DX: Pain in left knee: M25.561

## 2014-10-25 ENCOUNTER — Other Ambulatory Visit: Payer: Self-pay | Admitting: Internal Medicine

## 2014-11-07 ENCOUNTER — Other Ambulatory Visit: Payer: Self-pay

## 2014-12-18 ENCOUNTER — Telehealth: Payer: Self-pay | Admitting: Internal Medicine

## 2014-12-18 NOTE — Telephone Encounter (Signed)
Pre Visit letter sent  °

## 2014-12-20 ENCOUNTER — Other Ambulatory Visit: Payer: Self-pay

## 2014-12-27 ENCOUNTER — Ambulatory Visit (INDEPENDENT_AMBULATORY_CARE_PROVIDER_SITE_OTHER): Payer: PRIVATE HEALTH INSURANCE | Admitting: Family Medicine

## 2014-12-27 VITALS — BP 166/82 | HR 53 | Temp 97.8°F | Resp 18 | Ht 70.0 in | Wt 180.0 lb

## 2014-12-27 DIAGNOSIS — H7291 Unspecified perforation of tympanic membrane, right ear: Secondary | ICD-10-CM | POA: Diagnosis not present

## 2014-12-27 DIAGNOSIS — J019 Acute sinusitis, unspecified: Secondary | ICD-10-CM | POA: Diagnosis not present

## 2014-12-27 DIAGNOSIS — M5416 Radiculopathy, lumbar region: Secondary | ICD-10-CM | POA: Diagnosis not present

## 2014-12-27 DIAGNOSIS — J4531 Mild persistent asthma with (acute) exacerbation: Secondary | ICD-10-CM | POA: Diagnosis not present

## 2014-12-27 MED ORDER — HYDROCODONE-ACETAMINOPHEN 5-325 MG PO TABS: 1.0000 | ORAL_TABLET | Freq: Four times a day (QID) | ORAL | Status: DC | PRN

## 2014-12-27 MED ORDER — AMOXICILLIN 875 MG PO TABS: 875.0000 mg | ORAL_TABLET | Freq: Two times a day (BID) | ORAL | Status: DC

## 2014-12-27 MED ORDER — PREDNISONE 20 MG PO TABS: 20.0000 mg | ORAL_TABLET | Freq: Every day | ORAL | Status: DC

## 2014-12-27 NOTE — Progress Notes (Signed)
  Subjective:  Patient ID: Brett Berry, male    DOB: [birth year]  Age: 64 y.o. MRN: [id number]  Patient is here for a couple of things. He's been having problems with pain in his back for the past couple of weeks. He been moving a lot of furniture. He has pain in the right low back with radiation of pain down the right leg. He did not have a single specific injury, but composite of doing a lot of hard work. He's been under a lot of stress with the recent death of his wife 3 weeks ago. He has had to move things around to get his son settled into his house.  The patient has a long history of ear problems. He wears hearing aids. He has had tubes in his ears and they had to make more of a whole to get the tube out of the right ear. He has a chronic perforation there. He noticed drainage from that ear last night. He has been hurting on the right ear and right sinus area. He does not smoke.  Patient does have a history of asthma and wheezing. He has been on cigarettes in the past.  Past f/s hx reviewed   Objective:   Pleasant alert gentleman in no major acute distress. Wears hearing aids bilaterally. The left TM is normal except for some old scarring, the right has a mid perforation only about 1 mm or 1.5 mm in diameter. No redness or inflammation noted. Throat clear. Neck supple without significant nodes. Chest has wheezing at the left base. Heart regular without murmurs. Abdomen soft. Straight leg raising test is positive on the right at 60, on the left at about 75 he gets contralateral pain. Deep tendon reflexes 1-2+ symmetrical in the knees and ankles. Patient is not significantly tenderness back or paraspinous muscles.  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 Treat with advice, steroid-induced, and pain medication.  Patient Instructions  Take the amoxicillin 875 mg 1 twice daily for infection of sinuses and ear  Take the prednisone 20 mg 3 pills daily for 2 days, then 2 daily for 2 days, then 1 daily for 2 days, then one half daily for the remaining 4 days for inflammation of nerve in back and for the sinuses. Ignore the way around it on the label.  Take the hydrocodone pain pills one every 4-6 hours if needed for severe pain  Return if worse or problems not improving. Follow-up with your primary care doctor at your physical soon.    HOPPER,DAVID, MD 12/27/2014

## 2014-12-27 NOTE — Patient Instructions (Addendum)
Take the amoxicillin 875 mg 1 twice daily for infection of sinuses and ear  Take the prednisone 20 mg 3 pills daily for 2 days, then 2 daily for 2 days, then 1 daily for 2 days, then one half daily for the remaining 4 days for inflammation of nerve in back and for the sinuses. Ignore the way around it on the label.  Take the hydrocodone pain pills one every 4-6 hours if needed for severe pain  Return if worse or problems not improving. Follow-up with your primary care doctor at your physical soon.

## 2015-01-08 ENCOUNTER — Encounter: Payer: Self-pay | Admitting: Internal Medicine

## 2015-01-08 ENCOUNTER — Ambulatory Visit (INDEPENDENT_AMBULATORY_CARE_PROVIDER_SITE_OTHER): Payer: PRIVATE HEALTH INSURANCE | Admitting: Internal Medicine

## 2015-01-08 VITALS — BP 118/64 | HR 55 | Temp 98.0°F | Ht 70.0 in | Wt 174.0 lb

## 2015-01-08 DIAGNOSIS — Z0000 Encounter for general adult medical examination without abnormal findings: Secondary | ICD-10-CM | POA: Diagnosis not present

## 2015-01-08 DIAGNOSIS — F411 Generalized anxiety disorder: Secondary | ICD-10-CM

## 2015-01-08 DIAGNOSIS — Z23 Encounter for immunization: Secondary | ICD-10-CM | POA: Diagnosis not present

## 2015-01-08 DIAGNOSIS — M1990 Unspecified osteoarthritis, unspecified site: Secondary | ICD-10-CM | POA: Insufficient documentation

## 2015-01-08 DIAGNOSIS — K219 Gastro-esophageal reflux disease without esophagitis: Secondary | ICD-10-CM

## 2015-01-08 DIAGNOSIS — J310 Chronic rhinitis: Secondary | ICD-10-CM

## 2015-01-08 LAB — BASIC METABOLIC PANEL
BUN: 22 mg/dL (ref 6–23)
CO2: 29 mEq/L (ref 19–32)
Calcium: 8.7 mg/dL (ref 8.4–10.5)
Chloride: 104 mEq/L (ref 96–112)
Creatinine, Ser: 1.06 mg/dL (ref 0.40–1.50)
GFR: 75.2 mL/min (ref >60.00)
Glucose, Bld: 105 mg/dL — ABNORMAL HIGH (ref 70–99)
POTASSIUM: 4.1 meq/L (ref 3.5–5.1)
Sodium: 137 mEq/L (ref 135–145)

## 2015-01-08 LAB — LIPID PANEL
CHOL/HDL RATIO: 3
CHOLESTEROL: 204 mg/dL — AB (ref 0–200)
HDL: 58.3 mg/dL (ref >39.00)
LDL Cholesterol: 131 mg/dL — ABNORMAL HIGH (ref 0–99)
NonHDL: 145.7
Triglycerides: 75 mg/dL (ref 0.0–149.0)
VLDL: 15 mg/dL (ref 0.0–40.0)

## 2015-01-08 MED ORDER — BUDESONIDE-FORMOTEROL FUMARATE 160-4.5 MCG/ACT IN AERO: 2.0000 | INHALATION_SPRAY | Freq: Two times a day (BID) | RESPIRATORY_TRACT | Status: DC

## 2015-01-08 MED ORDER — ESOMEPRAZOLE MAGNESIUM 40 MG PO CPDR: 40.0000 mg | DELAYED_RELEASE_CAPSULE | Freq: Two times a day (BID) | ORAL | Status: DC

## 2015-01-08 NOTE — Patient Instructions (Signed)
Get your blood work before you leave    Call any time if needed

## 2015-01-08 NOTE — Progress Notes (Signed)
Pre visit review using our clinic review tool, if applicable. No additional management support is needed unless otherwise documented below in the visit note. 

## 2015-01-08 NOTE — Assessment & Plan Note (Signed)
  Td 2013 PNM shot 2013 prevnar 2015 Shingles shot 01-2015  CCS- 09/25/11-adenomatous polyps and diverticulosis; repeat in 5 years (09/2016) DRE -PSA 2015 were normal, will recheck next year Diet -exercise discussed

## 2015-01-08 NOTE — Assessment & Plan Note (Signed)
  Patient's wife passed away 1 month ago, since then patient has had increased anxiety and his chronic, mild difficulty sleeping has gotten worse. He has been using melatonin to help him sleep which provides some help.  PHQ2 (-) Plan: Patient does not want additional medication to help sleep.  Counseled patient on grieving and encouraged other counseling Patient knows to contact us for additional medication.

## 2015-01-08 NOTE — Progress Notes (Signed)
Subjective:    Patient ID: Brett Berry, male    DOB: November 10, 1950, 64 y.o.   MRN: [id number]  DOS:  01/08/2015 Type of visit - description : cpx Interval history:  Patient is a 64 year old male with a history of asthma, HLD, and OA in today for routine medical care.  Anxiety/Insomnia: Patient's wife passed away 1 month ago and patient has been grieving since. Patient notes feeling sad and occasionally anxious with infrequent episodes of diaphoresis and palpitations. Patient denies feeling depressed and suicidal ideation. Associated with this is recent insomnia which he has addressed by taking melatonin. He says the melatonin helps him get to sleep but it does not work well to keep him asleep. He is not interested in additional medications at this time.   Asthma: Patient notes good compliance with medications, states symbicort is helping his condition. He can tell a difference when he forgets to take it. Has albuterol as well but has not used it in the last two months. Feels mildly SOB with exertion but this is at baseline.  OA: Patient has been taking Celebrex with good control. States knee pain is managed well and is not interested in pursuing additional treatment options at this time.  ED: Patient has used  Cialis before  with good effect and no adverse reactions    Review of Systems  Constitutional: No fever. No chills. No unexplained wt changes. No unusual sweats  HEENT: No dental problems, no ear discharge, no facial swelling, no voice changes. No eye discharge, no eye  redness , no  intolerance to light   Respiratory: As per HPI  Cardiovascular: No CP, no leg swelling , no  Palpitations  GI: no nausea, no vomiting, no diarrhea , no  abdominal pain.  No blood in the stools. No dysphagia, no odynophagia    Endocrine: No polyphagia, no polyuria , no polydipsia  GU: No dysuria, gross hematuria, difficulty urinating. No urinary urgency, no frequency.  Musculoskeletal: Occ knee  pain managed with celebrex  Skin: No change in the color of the skin, palor , no rash  Allergic, immunologic: No environmental allergies , no  food allergies  Neurological: No dizziness no syncope. No headaches. No diplopia, no slurred speech, no motor deficits, no facial numbness  Hematological: No enlarged lymph nodes, no easy bruising, no unusual bleedings  Psychiatry: No suicidal ideas, no hallucinations, no behavior problems, no confusion.    Past Medical History  Diagnosis Date  . Asthma   . Sinusitis, chronic   . Allergic rhinitis   . Hearing difficulty     has aids  . Hyperlipidemia     intolerant to zocor  . Erectile dysfunction 07/30/2014    Past Surgical History  Procedure Laterality Date  . Sinus endo with fusion    . Mastoidectomy      right   . Right hand surg      for infection in '07    History   Social History  . Marital Status: Single    Spouse Name: N/A  . Number of Children: 1  . Years of Education: N/A   Occupational History  . manage nitting dept    Social History Main Topics  . Smoking status: Former Smoker    Quit date: 09/08/1973  . Smokeless tobacco: Never Used  . Alcohol Use: 4.8 oz/week    8 Cans of beer per week     Comment: 2-3 times week  . Drug Use: No  .  Sexual Activity:    Partners: Female   Other Topics Concern  . Not on file   Social History Narrative   HSG. Married '76- 6 years divorced ; '85. 1 son- '86.    Lives w/ second wife of 29 years   Lost wife 12-2014     Family History  Problem Relation Age of Onset  . COPD Mother   . Breast cancer Mother   . Heart disease Mother   . Alzheimer's disease Father   . Lung cancer Father     smoker  . Prostate cancer Father     died age 64  . Colon cancer Neg Hx        Medication List       This list is accurate as of: 01/08/15  5:48 PM.  Always use your most recent med list.               budesonide-formoterol 160-4.5 MCG/ACT inhaler  Commonly known as:   SYMBICORT  Inhale 2 puffs into the lungs 2 (two) times daily.     celecoxib 100 MG capsule  Commonly known as:  CELEBREX  TAKE 1 CAPSULE (100 MG TOTAL) BY MOUTH DAILY AS NEEDED.     esomeprazole 40 MG capsule  Commonly known as:  NEXIUM  Take 1 capsule (40 mg total) by mouth 2 (two) times daily before a meal.     fluticasone 50 MCG/ACT nasal spray  Commonly known as:  FLONASE  Place 2 sprays into the nose daily.     levalbuterol 45 MCG/ACT inhaler  Commonly known as:  XOPENEX HFA  Inhale 1-2 puffs into the lungs as needed.           Objective:   Physical Exam BP 118/64 mmHg  Pulse 55  Temp(Src) 98 F (36.7 C) (Oral)  Ht 5\' 10"  (1.778 m)  Wt 174 lb (78.926 kg)  BMI 24.97 kg/m2  SpO2 97%  General:   Well developed, well nourished . NAD.  Neck:  Full range of motion. Supple. No thyromegaly HEENT:  Normocephalic. Face symmetric, atraumatic Lungs:  CTA B Normal respiratory effort, no intercostal retractions, no accessory muscle use. Heart: RRR, no murmur.  No pretibial edema bilaterally  Abdomen:  Not distended, soft, non-tender. No rebound or rigidity. No mass, organomegaly Skin: Exposed areas without rash. Not pale. Not jaundice Neurologic:  alert & oriented X3.  Speech normal, gait appropriate for age and unassisted Psych: Cognition and judgment appear intact.  Cooperative with normal attention span and concentration.  Behavior appropriate. No anxious or depressed appearing.     Assessment & Plan:   (Patient seen along with   Aletta Edouard, medical student)    Asthma: Patient notes great control of asthma with Symbicort, has levalbuterol to be taken prn but has not needed it in two months. Continue current regimen.  OA: Patient notes occasional knee pain which he takes Celebrex for. Patient notes that the Celebrex is taking care of the pain effectively.    Hyperlipidemia: Patient not currently on medication for cholesterol, will check FLP today  and reassess treatment plan.

## 2015-01-28 ENCOUNTER — Other Ambulatory Visit: Payer: Self-pay | Admitting: Internal Medicine

## 2015-03-06 ENCOUNTER — Other Ambulatory Visit: Payer: Self-pay

## 2015-03-06 DIAGNOSIS — J310 Chronic rhinitis: Secondary | ICD-10-CM

## 2015-03-06 MED ORDER — BUDESONIDE-FORMOTEROL FUMARATE 160-4.5 MCG/ACT IN AERO: 2.0000 | INHALATION_SPRAY | Freq: Two times a day (BID) | RESPIRATORY_TRACT | Status: DC

## 2015-03-17 ENCOUNTER — Ambulatory Visit (INDEPENDENT_AMBULATORY_CARE_PROVIDER_SITE_OTHER): Payer: PRIVATE HEALTH INSURANCE | Admitting: Emergency Medicine

## 2015-03-17 VITALS — BP 160/100 | HR 60 | Temp 98.5°F | Resp 16 | Ht 71.0 in | Wt 176.4 lb

## 2015-03-17 DIAGNOSIS — R030 Elevated blood-pressure reading, without diagnosis of hypertension: Secondary | ICD-10-CM | POA: Diagnosis not present

## 2015-03-17 DIAGNOSIS — S335XXA Sprain of ligaments of lumbar spine, initial encounter: Secondary | ICD-10-CM | POA: Diagnosis not present

## 2015-03-17 DIAGNOSIS — IMO0001 Reserved for inherently not codable concepts without codable children: Secondary | ICD-10-CM

## 2015-03-17 MED ORDER — TRAMADOL HCL 50 MG PO TABS: 50.0000 mg | ORAL_TABLET | Freq: Three times a day (TID) | ORAL | Status: DC | PRN

## 2015-03-17 MED ORDER — CYCLOBENZAPRINE HCL 10 MG PO TABS: 10.0000 mg | ORAL_TABLET | Freq: Three times a day (TID) | ORAL | Status: DC | PRN

## 2015-03-17 MED ORDER — NAPROXEN SODIUM 550 MG PO TABS: 550.0000 mg | ORAL_TABLET | Freq: Two times a day (BID) | ORAL | Status: DC

## 2015-03-17 NOTE — Patient Instructions (Signed)

## 2015-03-17 NOTE — Progress Notes (Signed)
Subjective:  Patient ID: Brett Berry, male    DOB: 09-24-1950  Age: 64 y.o. MRN: [id number]  CC: Back Pain   HPI Brett Berry presents    He has a history of several days' duration of lower back pain. Said that he moved some things in a shop and since then has had lower back pain. Pain is not radiating. There is no known neurologic symptoms numbness tingling or weakness. He has no history of direct injury. He's been unable to control her symptoms with over-the-counter medication. He suffered a similar acute low back pain in May after moving furniture in his house.  History Brett Berry has a past medical history of Asthma; Sinusitis, chronic; Allergic rhinitis; Hearing difficulty; Hyperlipidemia; Erectile dysfunction (07/30/2014); and Bilateral knee pain (2016).   He has past surgical history that includes Sinus endo with fusion; Mastoidectomy; and right hand surg.   His  family history includes Alzheimer's disease in his father; Breast cancer in his mother; COPD in his mother; Heart disease in his mother; Lung cancer in his father; Prostate cancer in his father. There is no history of Colon cancer.  He   reports that he quit smoking about 41 years ago. He has never used smokeless tobacco. He reports that he drinks about 4.8 oz of alcohol per week. He reports that he does not use illicit drugs.  Outpatient Prescriptions Prior to Visit  Medication Sig Dispense Refill  . budesonide-formoterol (SYMBICORT) 160-4.5 MCG/ACT inhaler Inhale 2 puffs into the lungs 2 (two) times daily. 3 Inhaler 2  . celecoxib (CELEBREX) 100 MG capsule TAKE 1 CAPSULE (100 MG TOTAL) BY MOUTH DAILY AS NEEDED. 90 capsule 1  . esomeprazole (NEXIUM) 40 MG capsule Take 1 capsule (40 mg total) by mouth 2 (two) times daily before a meal. 180 capsule 2  . fluticasone (FLONASE) 50 MCG/ACT nasal spray Place 2 sprays into the nose daily.      Marland Kitchen levalbuterol (XOPENEX HFA) 45 MCG/ACT inhaler Inhale 1-2 puffs into the lungs daily  as needed. 1 Inhaler 12   No facility-administered medications prior to visit.    Social History   Social History  . Marital Status: Single    Spouse Name: N/A  . Number of Children: 1  . Years of Education: N/A   Occupational History  . manage nitting dept    Social History Main Topics  . Smoking status: Former Smoker    Quit date: 09/08/1973  . Smokeless tobacco: Never Used  . Alcohol Use: 4.8 oz/week    8 Cans of beer per week     Comment: 2-3 times week  . Drug Use: No  . Sexual Activity:    Partners: Female   Other Topics Concern  . None   Social History Narrative   HSG. Married '76- 6 years divorced ; '85. 1 son- '86.    Lives w/ second wife of 26 years   Lost wife 12-2014     Review of Systems  Constitutional: Negative for fever, chills and appetite change.  HENT: Negative for congestion, ear pain, postnasal drip, sinus pressure and sore throat.   Eyes: Negative for pain and redness.  Respiratory: Negative for cough, shortness of breath and wheezing.   Cardiovascular: Negative for leg swelling.  Gastrointestinal: Negative for nausea, vomiting, abdominal pain, diarrhea, constipation and blood in stool.  Endocrine: Negative for polyuria.  Genitourinary: Negative for dysuria, urgency, frequency and flank pain.  Musculoskeletal: Negative for gait problem.  Skin: Negative for rash.  Neurological: Negative for weakness and headaches.  Psychiatric/Behavioral: Negative for confusion and decreased concentration. The patient is not nervous/anxious.     Objective:  BP 160/100 mmHg  Berry 60  Temp(Src) 98.5 F (36.9 C) (Oral)  Resp 16  Ht 5\' 11"  (1.803 m)  Wt 176 lb 6.4 oz (80.015 kg)  BMI 24.61 kg/m2  SpO2 98%  Physical Exam  Constitutional: He is oriented to person, place, and time. He appears well-developed and well-nourished.  HENT:  Head: Normocephalic and atraumatic.  Eyes: Conjunctivae are normal. Pupils are equal, round, and reactive to light.    Pulmonary/Chest: Effort normal.  Musculoskeletal: He exhibits no edema.       Lumbar back: He exhibits tenderness and spasm.  Neurological: He is alert and oriented to person, place, and time.  Skin: Skin is dry.  Psychiatric: He has a normal mood and affect. His behavior is normal. Thought content normal.      Assessment & Plan:   Brett Berry was seen today for back pain.  Diagnoses and all orders for this visit:  Sprain of lumbar region, initial encounter  Elevated BP  Other orders -     naproxen sodium (ANAPROX DS) 550 MG tablet; Take 1 tablet (550 mg total) by mouth 2 (two) times daily with a meal. -     cyclobenzaprine (FLEXERIL) 10 MG tablet; Take 1 tablet (10 mg total) by mouth 3 (three) times daily as needed for muscle spasms. -     traMADol (ULTRAM) 50 MG tablet; Take 1 tablet (50 mg total) by mouth every 8 (eight) hours as needed.   I am having Brett Berry start on naproxen sodium, cyclobenzaprine, and traMADol. I am also having him maintain his fluticasone, celecoxib, esomeprazole, levalbuterol, and budesonide-formoterol.  Meds ordered this encounter  Medications  . naproxen sodium (ANAPROX DS) 550 MG tablet    Sig: Take 1 tablet (550 mg total) by mouth 2 (two) times daily with a meal.    Dispense:  40 tablet    Refill:  0  . cyclobenzaprine (FLEXERIL) 10 MG tablet    Sig: Take 1 tablet (10 mg total) by mouth 3 (three) times daily as needed for muscle spasms.    Dispense:  30 tablet    Refill:  0  . traMADol (ULTRAM) 50 MG tablet    Sig: Take 1 tablet (50 mg total) by mouth every 8 (eight) hours as needed.    Dispense:  30 tablet    Refill:  0    Appropriate red flag conditions were discussed with the patient as well as actions that should be taken.  Patient expressed his understanding.  Follow-up: Return if symptoms worsen or fail to improve.  Brett Culver, MD

## 2015-04-01 ENCOUNTER — Ambulatory Visit (INDEPENDENT_AMBULATORY_CARE_PROVIDER_SITE_OTHER): Payer: PRIVATE HEALTH INSURANCE | Admitting: Family Medicine

## 2015-04-01 VITALS — BP 132/74 | HR 80 | Temp 98.2°F | Resp 16 | Ht 70.5 in | Wt 175.2 lb

## 2015-04-01 DIAGNOSIS — H6531 Chronic mucoid otitis media, right ear: Secondary | ICD-10-CM | POA: Diagnosis not present

## 2015-04-01 MED ORDER — AMOXICILLIN-POT CLAVULANATE 875-125 MG PO TABS: 1.0000 | ORAL_TABLET | Freq: Two times a day (BID) | ORAL | Status: DC

## 2015-04-01 MED ORDER — NEOMYCIN-POLYMYXIN-HC 3.5-10000-1 OT SOLN: 3.0000 [drp] | Freq: Four times a day (QID) | OTIC | Status: DC

## 2015-04-01 NOTE — Progress Notes (Addendum)
° °  Subjective:    Patient ID: Brett Berry, male    DOB: 03/26/1951, 64 y.o.   MRN: [id number] This chart was scribed for Robyn Haber, MD by Zola Button, Medical Scribe. This patient was seen in Room 9 and the patient's care was started at 7:54 PM.   HPI HPI Comments: Brett Berry is a 64 y.o. male with a history of chronic sinusitis who presents to the [facility] complaining of gradual onset, waxing and waning right ear pain that started 3 days ago. Patient also reports having right-sided sinus pressure. He believes he has sinusitis. NKDA.  Patient is a Freight forwarder for a [employer].  Review of Systems  HENT: Positive for ear pain and sinus pressure.        Objective:   Physical Exam CONSTITUTIONAL: Well developed/well nourished HEAD: Normocephalic/atraumatic EYES: EOM/PERRL ENMT: Mucous membranes moist, right TM has a perforation with purulent discharge in the canal, patient wearing hearing aid in his left ear and he has dullness of that left TM as well. The nasal passages are mildly swollen with minimal mucopurulent discharge NECK: supple no meningeal signs SPINE: entire spine nontender CV: S1/S2 noted, no murmurs/rubs/gallops noted LUNGS: Lungs are clear to auscultation bilaterally, no apparent distress GU: no cva tenderness NEURO: Pt is awake/alert, moves all extremitiesx4 EXTREMITIES: pulses normal, full ROM SKIN: warm, color normal PSYCH: no abnormalities of mood noted     Assessment & Plan:   This chart was scribed in my presence and reviewed by me personally.    ICD-9-CM ICD-10-CM   1. Chronic mucoid otitis media of right ear 381.20 H65.31 amoxicillin-clavulanate (AUGMENTIN) 875-125 MG per tablet     neomycin-polymyxin-hydrocortisone (CORTISPORIN) otic solution     Signed, Robyn Haber, MD

## 2015-04-01 NOTE — Patient Instructions (Signed)

## 2015-04-25 ENCOUNTER — Ambulatory Visit (INDEPENDENT_AMBULATORY_CARE_PROVIDER_SITE_OTHER): Payer: PRIVATE HEALTH INSURANCE | Admitting: Emergency Medicine

## 2015-04-25 VITALS — BP 162/78 | HR 52 | Temp 97.7°F | Resp 18 | Ht 69.5 in | Wt 185.0 lb

## 2015-04-25 DIAGNOSIS — H6691 Otitis media, unspecified, right ear: Secondary | ICD-10-CM | POA: Diagnosis not present

## 2015-04-25 DIAGNOSIS — IMO0001 Reserved for inherently not codable concepts without codable children: Secondary | ICD-10-CM

## 2015-04-25 DIAGNOSIS — R030 Elevated blood-pressure reading, without diagnosis of hypertension: Secondary | ICD-10-CM

## 2015-04-25 DIAGNOSIS — S335XXD Sprain of ligaments of lumbar spine, subsequent encounter: Secondary | ICD-10-CM

## 2015-04-25 MED ORDER — AMOXICILLIN-POT CLAVULANATE 875-125 MG PO TABS: 1.0000 | ORAL_TABLET | Freq: Two times a day (BID) | ORAL | Status: DC

## 2015-04-25 MED ORDER — NAPROXEN SODIUM 550 MG PO TABS: 550.0000 mg | ORAL_TABLET | Freq: Two times a day (BID) | ORAL | Status: DC

## 2015-04-25 MED ORDER — CYCLOBENZAPRINE HCL 10 MG PO TABS: 10.0000 mg | ORAL_TABLET | Freq: Three times a day (TID) | ORAL | Status: DC | PRN

## 2015-04-25 NOTE — Patient Instructions (Signed)

## 2015-04-25 NOTE — Progress Notes (Signed)
Subjective:  Patient ID: Brett Berry, male    DOB: [birth year]  Age: 64 y.o. MRN: [id number]  CC: Tailbone Pain; Medication Refill; and Nasal Congestion   HPI Brett Berry presents  with drainage from his right ear. Brett Berry also has some back pain. Brett Berry has a history of a chronic otitis media related to a perforation was right ear drum. Brett Berry denies any fever chills nausea vomiting does have nasal congestion postnasal drainage. On color Brett Berry denies any fever chills. Denies any cough or sore throat. Brett Berry's also complaining of pain in his low back. Nonradiating no history of injury or overuse. Says the pain is intermittent and not associated with any numbness tingling or weakness in his lower extremities.   History Brett Berry has a past medical history of Asthma; Sinusitis, chronic; Allergic rhinitis; Hearing difficulty; Hyperlipidemia; Erectile dysfunction (07/30/2014); and Bilateral knee pain (2016).   Brett Berry has past surgical history that includes Sinus endo with fusion; Mastoidectomy; and right hand surg.   His  family history includes Alzheimer's disease in his father; Breast cancer in his mother; COPD in his mother; Heart disease in his mother; Lung cancer in his father; Prostate cancer in his father. There is no history of Colon cancer.  Brett Berry   reports that Brett Berry quit smoking about 41 years ago. Brett Berry has never used smokeless tobacco. Brett Berry reports that Brett Berry drinks about 4.8 oz of alcohol per week. Brett Berry reports that Brett Berry does not use illicit drugs.  Outpatient Prescriptions Prior to Visit  Medication Sig Dispense Refill  . amoxicillin-clavulanate (AUGMENTIN) 875-125 MG per tablet Take 1 tablet by mouth 2 (two) times daily. 20 tablet 0  . budesonide-formoterol (SYMBICORT) 160-4.5 MCG/ACT inhaler Inhale 2 puffs into the lungs 2 (two) times daily. 3 Inhaler 2  . celecoxib (CELEBREX) 100 MG capsule TAKE 1 CAPSULE (100 MG TOTAL) BY MOUTH DAILY AS NEEDED. 90 capsule 1  . fluticasone (FLONASE) 50 MCG/ACT nasal spray Place 2  sprays into the nose daily.      Marland Kitchen levalbuterol (XOPENEX HFA) 45 MCG/ACT inhaler Inhale 1-2 puffs into the lungs daily as needed. 1 Inhaler 12  . neomycin-polymyxin-hydrocortisone (CORTISPORIN) otic solution Place 3 drops into the right ear 4 (four) times daily. 10 mL 0  . traMADol (ULTRAM) 50 MG tablet Take 1 tablet (50 mg total) by mouth every 8 (eight) hours as needed. 30 tablet 0  . cyclobenzaprine (FLEXERIL) 10 MG tablet Take 1 tablet (10 mg total) by mouth 3 (three) times daily as needed for muscle spasms. 30 tablet 0  . naproxen sodium (ANAPROX DS) 550 MG tablet Take 1 tablet (550 mg total) by mouth 2 (two) times daily with a meal. 40 tablet 0  . esomeprazole (NEXIUM) 40 MG capsule Take 1 capsule (40 mg total) by mouth 2 (two) times daily before a meal. (Patient not taking: Reported on 04/25/2015) 180 capsule 2   No facility-administered medications prior to visit.    Social History   Social History  . Marital Status: Single    Spouse Name: N/A  . Number of Children: 1  . Years of Education: N/A   Occupational History  . manage nitting dept    Social History Main Topics  . Smoking status: Former Smoker    Quit date: 09/08/1973  . Smokeless tobacco: Never Used  . Alcohol Use: 4.8 oz/week    8 Cans of beer per week     Comment: 2-3 times week  . Drug Use: No  . Sexual Activity:  Partners: Female   Other Topics Concern  . None   Social History Narrative   HSG. Married '76- 6 years divorced ; '85. 1 son- '86.    Lives w/ second wife of 76 years   Lost wife 12-2014     Review of Systems  Constitutional: Negative for fever, chills and appetite change.  HENT: Positive for ear discharge, postnasal drip and sinus pressure. Negative for congestion, ear pain and sore throat.   Eyes: Negative for pain and redness.  Respiratory: Negative for cough, shortness of breath and wheezing.   Cardiovascular: Negative for leg swelling.  Gastrointestinal: Negative for nausea,  vomiting, abdominal pain, diarrhea, constipation and blood in stool.  Endocrine: Negative for polyuria.  Genitourinary: Negative for dysuria, urgency, frequency and flank pain.  Musculoskeletal: Positive for back pain. Negative for gait problem.  Skin: Negative for rash.  Neurological: Negative for weakness and headaches.  Psychiatric/Behavioral: Negative for confusion and decreased concentration. The patient is not nervous/anxious.     Objective:  BP 162/78 mmHg  Pulse 52  Temp(Src) 97.7 F (36.5 C) (Oral)  Resp 18  Ht 5' 9.5" (1.765 m)  Wt 185 lb (83.915 kg)  BMI 26.94 kg/m2  SpO2 97%  Physical Exam  Constitutional: Brett Berry is oriented to person, place, and time. Brett Berry appears well-developed and well-nourished. No distress.  HENT:  Head: Normocephalic and atraumatic.  Right Ear: There is drainage. Tympanic membrane is perforated. A middle ear effusion is present.  Left Ear: External ear normal.  Nose: Nose normal.  Eyes: Conjunctivae and EOM are normal. Pupils are equal, round, and reactive to light. No scleral icterus.  Neck: Normal range of motion. Neck supple. No tracheal deviation present.  Cardiovascular: Normal rate, regular rhythm and normal heart sounds.   Pulmonary/Chest: Effort normal. No respiratory distress. Brett Berry has no wheezes. Brett Berry has no rales.  Abdominal: Brett Berry exhibits no mass. There is no tenderness. There is no rebound and no guarding.  Musculoskeletal: Brett Berry exhibits no edema.       Lumbar back: Brett Berry exhibits tenderness.  Lymphadenopathy:    Brett Berry has no cervical adenopathy.  Neurological: Brett Berry is alert and oriented to person, place, and time. Coordination normal.  Skin: Skin is warm and dry. No rash noted.  Psychiatric: Brett Berry has a normal mood and affect. His behavior is normal.      Assessment & Plan:   Brett Berry was seen today for tailbone pain, medication refill and nasal congestion.  Diagnoses and all orders for this visit:  Chronic otitis media of right ear, unspecified  otitis media type  Elevated blood pressure  Sprain of lumbar region, subsequent encounter  Other orders -     amoxicillin-clavulanate (AUGMENTIN) 875-125 MG per tablet; Take 1 tablet by mouth 2 (two) times daily. -     cyclobenzaprine (FLEXERIL) 10 MG tablet; Take 1 tablet (10 mg total) by mouth 3 (three) times daily as needed for muscle spasms. -     naproxen sodium (ANAPROX DS) 550 MG tablet; Take 1 tablet (550 mg total) by mouth 2 (two) times daily with a meal.  I am having Brett Berry start on amoxicillin-clavulanate. I am also having him maintain his fluticasone, celecoxib, esomeprazole, levalbuterol, budesonide-formoterol, traMADol, amoxicillin-clavulanate, neomycin-polymyxin-hydrocortisone, cyclobenzaprine, and naproxen sodium.  Meds ordered this encounter  Medications  . amoxicillin-clavulanate (AUGMENTIN) 875-125 MG per tablet    Sig: Take 1 tablet by mouth 2 (two) times daily.    Dispense:  60 tablet    Refill:  0  .  cyclobenzaprine (FLEXERIL) 10 MG tablet    Sig: Take 1 tablet (10 mg total) by mouth 3 (three) times daily as needed for muscle spasms.    Dispense:  30 tablet    Refill:  0  . naproxen sodium (ANAPROX DS) 550 MG tablet    Sig: Take 1 tablet (550 mg total) by mouth 2 (two) times daily with a meal.    Dispense:  40 tablet    Refill:  0   As noted have elevated blood pressure at time of the office visit Brett Berry denied having hypertension the past was not interested in discussing treatment for his pressure as [facility]. Brett Berry's quitting his job tomorrow attributes the elevations pressure was stress related to his job. His wife just died after a lengthy illness. Brett Berry was asked to have it rechecked in 2 weeks  Appropriate red flag conditions were discussed with the patient as well as actions that should be taken.  Patient expressed his understanding.  Follow-up: Return if symptoms worsen or fail to improve.  Roselee Culver,  MD

## 2015-07-09 ENCOUNTER — Other Ambulatory Visit: Payer: Self-pay | Admitting: Internal Medicine

## 2015-10-08 ENCOUNTER — Other Ambulatory Visit: Payer: Self-pay | Admitting: Internal Medicine

## 2015-10-21 ENCOUNTER — Ambulatory Visit (INDEPENDENT_AMBULATORY_CARE_PROVIDER_SITE_OTHER): Payer: BLUE CROSS/BLUE SHIELD | Admitting: Internal Medicine

## 2015-10-21 VITALS — BP 134/84 | HR 54 | Temp 98.2°F | Resp 16 | Ht 70.0 in | Wt 176.2 lb

## 2015-10-21 DIAGNOSIS — J0101 Acute recurrent maxillary sinusitis: Secondary | ICD-10-CM

## 2015-10-21 DIAGNOSIS — H6691 Otitis media, unspecified, right ear: Secondary | ICD-10-CM

## 2015-10-21 MED ORDER — AMOXICILLIN-POT CLAVULANATE 875-125 MG PO TABS: 1.0000 | ORAL_TABLET | Freq: Two times a day (BID) | ORAL | Status: DC

## 2015-10-21 NOTE — Patient Instructions (Signed)
     IF you received an x-ray today, you will receive an invoice from [facility]. Please contact [facility] at [phone] with questions or concerns regarding your invoice.   IF you received labwork today, you will receive an invoice from [facility]. Please contact [facility] at [phone] with questions or concerns regarding your invoice.   Our billing staff will not be able to assist you with questions regarding bills from these companies.  You will be contacted with the lab results as soon as they are available. The fastest way to get your results is to activate your My Chart account. Instructions are located on the last page of this paperwork. If you have not heard from us regarding the results in 2 weeks, please contact this office.      

## 2015-10-21 NOTE — Progress Notes (Signed)
Subjective:  By signing my name below, I, Doran Stabler, attest that this documentation has been prepared under the direction and in the presence of Jannie Doyle P. Laney Pastor, MD. Electronically Signed: Doran Stabler, ED Scribe. 10/21/2015. 5:47 PM.   Patient ID: Brett Berry, male    DOB: 06-04-1951, 65 y.o.   MRN: [id number] Chief Complaint  Patient presents with  . yellow drainge from right ear    x 4 days  . right eye runny  . teeth hurt on right side   HPI HPI Comments: Brett Berry is a 65 y.o. male who presents to the [facility] complaining of otalgia that began Friday, 3 days ago. Pt denies fever, cough, or any other symptoms at this time.   Pt had a history of the same last year and was given antibiotics   . Past Medical History  Diagnosis Date  . Asthma   . Sinusitis, chronic   . Allergic rhinitis   . Hearing difficulty     has aids  . Hyperlipidemia     intolerant to zocor  . Erectile dysfunction 07/30/2014  . Bilateral knee pain 2016    x-ray showed medial compartment arthritis, worse on right than left   Current Outpatient Prescriptions on File Prior to Visit  Medication Sig Dispense Refill  . budesonide-formoterol (SYMBICORT) 160-4.5 MCG/ACT inhaler Inhale 2 puffs into the lungs 2 (two) times daily. 3 Inhaler 2  . celecoxib (CELEBREX) 100 MG capsule Take 1 capsule (100 mg total) by mouth daily as needed. 90 capsule 1  . cyclobenzaprine (FLEXERIL) 10 MG tablet Take 1 tablet (10 mg total) by mouth 3 (three) times daily as needed for muscle spasms. 30 tablet 0  . fluticasone (FLONASE) 50 MCG/ACT nasal spray Place 2 sprays into the nose daily.      Marland Kitchen levalbuterol (XOPENEX HFA) 45 MCG/ACT inhaler Inhale 1-2 puffs into the lungs daily as needed. 1 Inhaler 12  . neomycin-polymyxin-hydrocortisone (CORTISPORIN) otic solution Place 3 drops into the right ear 4 (four) times daily. 10 mL 0  . esomeprazole (NEXIUM) 40 MG capsule Take 1 capsule (40 mg  total) by mouth 2 (two) times daily before a meal. (Patient not taking: Reported on 10/21/2015) 180 capsule 1  . naproxen sodium (ANAPROX DS) 550 MG tablet Take 1 tablet (550 mg total) by mouth 2 (two) times daily with a meal. (Patient not taking: Reported on 10/21/2015) 40 tablet 0  . traMADol (ULTRAM) 50 MG tablet Take 1 tablet (50 mg total) by mouth every 8 (eight) hours as needed. (Patient not taking: Reported on 10/21/2015) 30 tablet 0   No current facility-administered medications on file prior to visit.   No Known Allergies   Review of Systems  Constitutional: Negative for fever.  HENT: Positive for ear pain.   Respiratory: Negative for cough.        Objective:   Physical Exam  Constitutional: He is oriented to person, place, and time. He appears well-developed and well-nourished.  HENT:  Head: Normocephalic and atraumatic.  Chronic perforations; hearing aide in the left ear; some muco-purulence behind right TM; purulent discharge in right nostril; tenderness of right maxillary area.   Cardiovascular: Normal rate.   Pulmonary/Chest: Effort normal.  Abdominal: He exhibits no distension.  Neurological: He is alert and oriented to person, place, and time.  Skin: Skin is warm and dry.  Psychiatric: He has a normal mood and affect.  Nursing note and vitals reviewed. BP 134/84 mmHg  Pulse 54  Temp(Src) 98.2 F (36.8 C) (Oral)  Resp 16  Ht 5\' 10"  (1.778 m)  Wt 176 lb 3.2 oz (79.924 kg)  BMI 25.28 kg/m2  SpO2 99%     Assessment & Plan:  Chronic otitis media of right ear, unspecified otitis media type  Acute recurrent maxillary sinusitis  Meds ordered this encounter  Medications  . amoxicillin-clavulanate (AUGMENTIN) 875-125 MG tablet    Sig: Take 1 tablet by mouth 2 (two) times daily.    Dispense:  60 tablet    Refill:  0     I have completed the patient encounter in its entirety as documented by the scribe, with editing by me where necessary. Briyan Kleven P. Laney Pastor,  M.D.

## 2015-11-28 ENCOUNTER — Other Ambulatory Visit: Payer: Self-pay | Admitting: Internal Medicine

## 2016-02-17 ENCOUNTER — Ambulatory Visit (HOSPITAL_BASED_OUTPATIENT_CLINIC_OR_DEPARTMENT_OTHER)
Admission: RE | Admit: 2016-02-17 | Discharge: 2016-02-17 | Disposition: A | Discharge status: Home / Self Care | Payer: BLUE CROSS/BLUE SHIELD | Source: Ambulatory Visit | Attending: Internal Medicine | Admitting: Internal Medicine

## 2016-02-17 ENCOUNTER — Encounter: Payer: Self-pay | Admitting: Internal Medicine

## 2016-02-17 ENCOUNTER — Ambulatory Visit (INDEPENDENT_AMBULATORY_CARE_PROVIDER_SITE_OTHER): Payer: BLUE CROSS/BLUE SHIELD | Admitting: Internal Medicine

## 2016-02-17 VITALS — BP 126/74 | HR 53 | Temp 97.9°F | Ht 70.0 in | Wt 178.5 lb

## 2016-02-17 DIAGNOSIS — Z0000 Encounter for general adult medical examination without abnormal findings: Secondary | ICD-10-CM | POA: Diagnosis not present

## 2016-02-17 DIAGNOSIS — M150 Primary generalized (osteo)arthritis: Secondary | ICD-10-CM | POA: Insufficient documentation

## 2016-02-17 DIAGNOSIS — R739 Hyperglycemia, unspecified: Secondary | ICD-10-CM | POA: Diagnosis not present

## 2016-02-17 DIAGNOSIS — S62603A Fracture of unspecified phalanx of left middle finger, initial encounter for closed fracture: Secondary | ICD-10-CM | POA: Insufficient documentation

## 2016-02-17 DIAGNOSIS — Z125 Encounter for screening for malignant neoplasm of prostate: Secondary | ICD-10-CM

## 2016-02-17 DIAGNOSIS — M159 Polyosteoarthritis, unspecified: Secondary | ICD-10-CM

## 2016-02-17 DIAGNOSIS — X58XXXA Exposure to other specified factors, initial encounter: Secondary | ICD-10-CM | POA: Insufficient documentation

## 2016-02-17 DIAGNOSIS — J310 Chronic rhinitis: Secondary | ICD-10-CM

## 2016-02-17 MED ORDER — LEVALBUTEROL TARTRATE 45 MCG/ACT IN AERO: 1.0000 | INHALATION_SPRAY | Freq: Every day | RESPIRATORY_TRACT | Status: DC | PRN

## 2016-02-17 MED ORDER — BUDESONIDE-FORMOTEROL FUMARATE 160-4.5 MCG/ACT IN AERO
2.0000 | INHALATION_SPRAY | Freq: Two times a day (BID) | RESPIRATORY_TRACT | Status: DC
Start: 2016-02-17 — End: 2017-03-06

## 2016-02-17 MED ORDER — CELECOXIB 100 MG PO CAPS: 100.0000 mg | ORAL_CAPSULE | Freq: Two times a day (BID) | ORAL | Status: DC | PRN

## 2016-02-17 NOTE — Assessment & Plan Note (Addendum)
Td 2013; PNM shot 2013; prevnar 2015; Shingles shot 01-2015  CCS- 09/25/11-adenomatous polyps and diverticulosis; repeat in 5 years (09/2016) DRE normal today, check a PSA  Labs: BMP, AST, ALT, FLP, CBC, A1c, PSA Diet -exercise discussed

## 2016-02-17 NOTE — Progress Notes (Signed)
Pre visit review using our clinic review tool, if applicable. No additional management support is needed unless otherwise documented below in the visit note. 

## 2016-02-17 NOTE — Patient Instructions (Signed)
GO TO THE LAB : Get the blood work     GO TO THE FRONT DESK Schedule your next appointment for a  routine checkup in 6 months   STOP BY THE FIRST FLOOR:  get the XR     

## 2016-02-17 NOTE — Progress Notes (Signed)
Subjective:    Patient ID: Brett Berry, male    DOB: 1951/03/11, 65 y.o.   MRN: [id number]  DOS:  02/17/2016 Type of visit - description : cpx Interval history: DJD: Increase Celebrex dose? Pain is not as well-controlled as it used to be.   Review of Systems  Constitutional: No fever. No chills. No unexplained wt changes. No unusual sweats  HEENT: No dental problems , no facial swelling, no voice changes. + Chronic eye and ear discharge, already discuss with ENT and ophthalmology  no eye  redness , no  intolerance to light   Respiratory: No wheezing , no  difficulty breathing. No cough , no mucus production  Cardiovascular: No CP, no leg swelling , no  Palpitations  GI: no nausea, no vomiting, no diarrhea , no  abdominal pain.  No blood in the stools. No dysphagia, no odynophagia    Endocrine: No polyphagia, no polyuria , no polydipsia  GU: No dysuria, gross hematuria, difficulty urinating. No urinary urgency, no frequency.  Musculoskeletal:  Noted the left index PIP to be swollen and with a decreased range of motion the morning of 12/02/2015 approximately, no previous injury  Skin: No change in the color of the skin, palor , no  Rash  Allergic, immunologic: No environmental allergies , no  food allergies  Neurological: No dizziness no  syncope. No headaches. No diplopia, no slurred, no slurred speech, no motor deficits, no facial  Numbness  Hematological: No enlarged lymph nodes, no easy bruising , no unusual bleedings  Psychiatry: No suicidal ideas, no hallucinations, no beavior problems, no confusion.  No unusual/severe anxiety, no depression  Past Medical History  Diagnosis Date  . Asthma   . Sinusitis, chronic   . Allergic rhinitis   . Hearing difficulty     has aids  . Hyperlipidemia     intolerant to zocor  . Erectile dysfunction 07/30/2014  . Bilateral knee pain 2016    x-ray showed medial compartment arthritis, worse on right than left    Past  Surgical History  Procedure Laterality Date  . Sinus endo with fusion    . Mastoidectomy      right   . Right hand surg      for infection in '07  . Cataract extraction Right 2016    Social History   Social History  . Marital Status: Single    Spouse Name: N/A  . Number of Children: 1  . Years of Education: N/A   Occupational History  . 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 Smoking status: Former Smoker    Quit date: 09/08/1973  . Smokeless tobacco: Never Used  . Alcohol Use: 4.8 oz/week    8 Cans of beer per week     Comment: 2-3 times week  . Drug Use: No  . Sexual Activity:    Partners: Female   Other Topics Concern  . Not on file   Social History Narrative   HSG. Married '76- 6 years divorced ; '85. 1 son- '86.    Lives w/ second wife of 41 years   Lost wife 12-2014     Family History  Problem Relation Age of Onset  . COPD Mother   . Breast cancer Mother   . Heart disease Mother     age early 104s  . Alzheimer's disease Father   . Lung cancer Father     smoker  . Prostate cancer Father  died age 49  . Colon cancer Neg Hx   . Heart disease Father     age 29      Medication List       This list is accurate as of: 02/17/16 11:59 PM.  Always use your most recent med list.               budesonide-formoterol 160-4.5 MCG/ACT inhaler  Commonly known as:  SYMBICORT  Inhale 2 puffs into the lungs 2 (two) times daily.     celecoxib 100 MG capsule  Commonly known as:  CELEBREX  Take 1 capsule (100 mg total) by mouth 2 (two) times daily as needed.     esomeprazole 40 MG capsule  Commonly known as:  NEXIUM  Take 1 capsule (40 mg total) by mouth 2 (two) times daily before a meal.     fluticasone 50 MCG/ACT nasal spray  Commonly known as:  FLONASE  Place 2 sprays into the nose daily.     levalbuterol 45 MCG/ACT inhaler  Commonly known as:  XOPENEX HFA  Inhale 1-2 puffs into the lungs daily as needed for wheezing or shortness  of breath.           Objective:   Physical Exam BP 126/74 mmHg  Pulse 53  Temp(Src) 97.9 F (36.6 C) (Oral)  Ht 5\' 10"  (1.778 m)  Wt 178 lb 8 oz (80.967 kg)  BMI 25.61 kg/m2  SpO2 98%  General:   Well developed, well nourished . NAD.  Neck: No  thyromegaly  HEENT:  Normocephalic . Face symmetric, atraumatic Lungs:  CTA B Normal respiratory effort, no intercostal retractions, no accessory muscle use. Heart: RRR,  no murmur.  No pretibial edema bilaterally  Abdomen:  Not distended, soft, non-tender. No rebound or rigidity.   Skin: Exposed areas without rash. Not pale. Not jaundice Rectal:  External abnormalities: none. Normal sphincter tone. No rectal masses or tenderness.  Stool brown  Prostate: Prostate gland firm and smooth, no enlargement, nodularity, tenderness, mass, asymmetry or induration.  MSK : Left index PIP: Large compared to the right, no red or warm, range of motion decrease, rest of the left hand normal Neurologic:  alert & oriented X3.  Speech normal, gait appropriate for age and unassisted Strength symmetric and appropriate for age.  Psych: Cognition and judgment appear intact.  Cooperative with normal attention span and concentration.  Behavior appropriate. No anxious or depressed appearing.    Assessment & Plan:   Assessment Hyperlipidemia ---intolerant to simvastatin Asthma GERD E.D. Chronic sinusitis, Rhinitis (sees ENT Dr Lucia Gaskins) DJD- knees R>>L, wrists  , Dr Sharol Given [facility] has aids  Plan: Asthma: Controlled, rarely used rescue inhalers DJD: Would like to increase Celebrex dose, okay to increase 100 mg twice a day, combined medication with Tylenol to minimize Cox-i  Use. Also left index swelling of unclear etiology, will get a x-ray, declined ortho referral, if not better he will let me know RTC 6 months

## 2016-02-18 ENCOUNTER — Other Ambulatory Visit: Payer: Self-pay

## 2016-02-18 DIAGNOSIS — S62629A Displaced fracture of medial phalanx of unspecified finger, initial encounter for closed fracture: Secondary | ICD-10-CM

## 2016-02-18 DIAGNOSIS — Z09 Encounter for follow-up examination after completed treatment for conditions other than malignant neoplasm: Secondary | ICD-10-CM | POA: Insufficient documentation

## 2016-02-18 LAB — LIPID PANEL
Cholesterol: 190 mg/dL (ref 0–200)
HDL: 49.7 mg/dL (ref >39.00)
LDL Cholesterol: 123 mg/dL — ABNORMAL HIGH (ref 0–99)
NONHDL: 140.22
TRIGLYCERIDES: 88 mg/dL (ref 0.0–149.0)
Total CHOL/HDL Ratio: 4
VLDL: 17.6 mg/dL (ref 0.0–40.0)

## 2016-02-18 LAB — CBC WITH DIFFERENTIAL/PLATELET
BASOS ABS: 0 10*3/uL (ref 0.0–0.1)
Basophils Relative: 0.5 % (ref 0.0–3.0)
EOS PCT: 10.1 % — AB (ref 0.0–5.0)
Eosinophils Absolute: 0.8 10*3/uL — ABNORMAL HIGH (ref 0.0–0.7)
HCT: 38 % — ABNORMAL LOW (ref 39.0–52.0)
HEMOGLOBIN: 12.7 g/dL — AB (ref 13.0–17.0)
Lymphocytes Relative: 41.3 % (ref 12.0–46.0)
Lymphs Abs: 3.2 10*3/uL (ref 0.7–4.0)
MCHC: 33.5 g/dL (ref 30.0–36.0)
MCV: 96.8 fl (ref 78.0–100.0)
MONOS PCT: 6.3 % (ref 3.0–12.0)
Monocytes Absolute: 0.5 10*3/uL (ref 0.1–1.0)
Neutro Abs: 3.2 10*3/uL (ref 1.4–7.7)
Neutrophils Relative %: 41.8 % — ABNORMAL LOW (ref 43.0–77.0)
Platelets: 240 10*3/uL (ref 150.0–400.0)
RBC: 3.93 Mil/uL — AB (ref 4.22–5.81)
RDW: 13.6 % (ref 11.5–15.5)
WBC: 7.7 10*3/uL (ref 4.0–10.5)

## 2016-02-18 LAB — BASIC METABOLIC PANEL
BUN: 13 mg/dL (ref 6–23)
CHLORIDE: 101 meq/L (ref 96–112)
CO2: 30 mEq/L (ref 19–32)
Calcium: 9 mg/dL (ref 8.4–10.5)
Creatinine, Ser: 0.93 mg/dL (ref 0.40–1.50)
GFR: 87.15 mL/min (ref >60.00)
Glucose, Bld: 94 mg/dL (ref 70–99)
POTASSIUM: 4.6 meq/L (ref 3.5–5.1)
SODIUM: 135 meq/L (ref 135–145)

## 2016-02-18 LAB — PSA: PSA: 1.38 ng/mL (ref 0.10–4.00)

## 2016-02-18 LAB — AST: AST: 17 U/L (ref 0–37)

## 2016-02-18 LAB — ALT: ALT: 16 U/L (ref 0–53)

## 2016-02-18 LAB — HEMOGLOBIN A1C: HEMOGLOBIN A1C: 5.5 % (ref 4.6–6.5)

## 2016-02-18 NOTE — Assessment & Plan Note (Signed)
Asthma: Controlled, rarely used rescue inhalers DJD: Would like to increase Celebrex dose, okay to increase 100 mg twice a day, combined medication with Tylenol to minimize Cox-i  Use. Also left index swelling of unclear etiology, will get a x-ray, declined ortho referral, if not better he will let me know RTC 6 months

## 2016-02-19 ENCOUNTER — Other Ambulatory Visit (INDEPENDENT_AMBULATORY_CARE_PROVIDER_SITE_OTHER): Payer: BLUE CROSS/BLUE SHIELD

## 2016-02-19 DIAGNOSIS — D649 Anemia, unspecified: Secondary | ICD-10-CM

## 2016-02-19 LAB — FERRITIN: Ferritin: 90.4 ng/mL (ref 22.0–322.0)

## 2016-02-19 LAB — IRON: Iron: 165 ug/dL (ref 42–165)

## 2016-03-16 ENCOUNTER — Telehealth: Payer: Self-pay

## 2016-03-16 NOTE — Telephone Encounter (Signed)
Results printed and mailed to Pt.  

## 2016-03-16 NOTE — Telephone Encounter (Signed)
Patient states he has not been able to get into mychart and is requesting a letter copy of his labs from his last visit

## 2016-07-03 ENCOUNTER — Other Ambulatory Visit: Payer: Self-pay | Admitting: Internal Medicine

## 2016-08-13 ENCOUNTER — Other Ambulatory Visit: Payer: Self-pay | Admitting: Internal Medicine

## 2016-08-17 ENCOUNTER — Encounter: Payer: Self-pay | Admitting: Internal Medicine

## 2016-08-17 ENCOUNTER — Encounter: Payer: Self-pay | Admitting: Gastroenterology

## 2016-08-17 ENCOUNTER — Ambulatory Visit (INDEPENDENT_AMBULATORY_CARE_PROVIDER_SITE_OTHER): Payer: BLUE CROSS/BLUE SHIELD | Admitting: Internal Medicine

## 2016-08-17 VITALS — BP 132/78 | HR 62 | Temp 98.1°F | Resp 14 | Ht 70.0 in | Wt 183.1 lb

## 2016-08-17 DIAGNOSIS — B349 Viral infection, unspecified: Secondary | ICD-10-CM

## 2016-08-17 DIAGNOSIS — J4520 Mild intermittent asthma, uncomplicated: Secondary | ICD-10-CM | POA: Diagnosis not present

## 2016-08-17 NOTE — Progress Notes (Signed)
Pre visit review using our clinic review tool, if applicable. No additional management support is needed unless otherwise documented below in the visit note. 

## 2016-08-17 NOTE — Progress Notes (Signed)
Subjective:    Patient ID: Brett Berry, male    DOB: 05/28/1951, 66 y.o.   MRN: [id number]  DOS:  08/17/2016 Type of visit - description : rov Interval history: In general doing well But developed respiratory symptoms 4 days ago: Sinus congestion, green nasal discharge, malaise, back ache, anterior chest pain with cough. He is taking TheraFlu. Good compliance with Symbicort, has not to reach for his rescue inhaler lately    Review of Systems  + Sputum production, brown in color. + Subjective temperature for 3 days. Had diarrhea once but no nausea or vomiting. Wheezing: Not an issue at this time.   Past Medical History:  Diagnosis Date  . Allergic rhinitis   . Asthma   . Bilateral knee pain 2016   x-ray showed medial compartment arthritis, worse on right than left  . Erectile dysfunction 07/30/2014  . Hearing difficulty    has aids  . Hyperlipidemia    intolerant to zocor  . Sinusitis, chronic     Past Surgical History:  Procedure Laterality Date  . CATARACT EXTRACTION Right 2016  . MASTOIDECTOMY     right   . right hand surg     for infection in '07  . SINUS ENDO WITH FUSION      Social History   Social History  . Marital status: Single    Spouse name: N/A  . Number of children: 1  . 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 Smoking status: Former Smoker    Quit date: 09/08/1973  . Smokeless tobacco: Never Used  . Alcohol use 4.8 oz/week    8 Cans of beer per week     Comment: 2-3 times week  . Drug use: No  . Sexual activity: Yes    Partners: Female   Other Topics Concern  . Not on file   Social History Narrative   HSG. Married '76- 6 years divorced ; '85. 1 son- '86.    Lives w/ second wife of 86 years   Lost wife 12-2014      Allergies as of 08/17/2016   No Known Allergies     Medication List       Accurate as of 08/17/16 11:59 PM. Always use your  most recent med list.          budesonide-formoterol 160-4.5 MCG/ACT inhaler Commonly known as:  SYMBICORT Inhale 2 puffs into the lungs 2 (two) times daily.   celecoxib 100 MG capsule Commonly known as:  CELEBREX Take 1 capsule (100 mg total) by mouth 2 (two) times daily as needed.   esomeprazole 40 MG capsule Commonly known as:  NEXIUM Take 1 capsule (40 mg total) by mouth 2 (two) times daily before a meal.   fluticasone 50 MCG/ACT nasal spray Commonly known as:  FLONASE Place 2 sprays into the nose daily.   levalbuterol 45 MCG/ACT inhaler Commonly known as:  XOPENEX HFA Inhale 1-2 puffs into the lungs daily as needed for wheezing or shortness of breath.          Objective:   Physical Exam BP 132/78 (BP Location: Left Arm, Patient Position: Sitting, Cuff Size: Normal)   Pulse 62   Temp 98.1 F (36.7 C) (Oral)   Resp 14   Ht 5\' 10"  (1.778 m)   Wt 183 lb 2 oz (83.1 kg)   SpO2 97%   BMI 26.28  kg/m  General:   Well developed, well nourished . NAD.  HEENT:  Normocephalic . Face symmetric, atraumatic. TMs normal. Throat: Red, symmetric, nose is slightly congested, sinuses no TTP Lungs:  Few rhonchi, clear with cough. Expiratory time increased with minimal wheezing Normal respiratory effort, no intercostal retractions, no accessory muscle use. Heart: RRR,  no murmur.  No pretibial edema bilaterally  Skin: Not pale. Not jaundice Neurologic:  alert & oriented X3.  Speech normal, gait appropriate for age and unassisted Psych--  Cognition and judgment appear intact.  Cooperative with normal attention span and concentration.  Behavior appropriate. No anxious or depressed appearing.      Assessment & Plan:   Assessment Hyperlipidemia ---intolerant to simvastatin Asthma GERD E.D. Chronic sinusitis, Rhinitis (sees ENT Dr Lucia Gaskins) DJD- knees R>>L, wrists  , Dr Sharol Given [facility] has aids  PLAN: Asthma: Usually well controlled except for the last few days. Has a mild  asthma exacerbation  Viral syndrome: Conservative treatment with rest, fluids, Mucinex, Flonase. Recommend to be more liberal using rescue inhalers and continue Symbicort. Call if no better. RTC 6 months, CPX

## 2016-08-17 NOTE — Patient Instructions (Signed)
GO TO THE FRONT DESK Schedule your next appointment for a  Physical in 6 months   ==============  Rest, fluids , tylenol  For cough:  Take Mucinex DM twice a day as needed until better  For nasal congestion: Use OTC Nasocort or Flonase : 2 nasal sprays on each side of the nose in the morning until you feel better   Continue symbycort, use the rescue inhaler if the cough is severe  Avoid decongestants such as  Pseudoephedrine or phenylephrine    Call if not gradually better over the next  10 days  Call anytime if the symptoms are severe

## 2016-08-18 NOTE — Assessment & Plan Note (Signed)
Asthma: Usually well controlled except for the last few days. Has a mild asthma exacerbation  Viral syndrome: Conservative treatment with rest, fluids, Mucinex, Flonase. Recommend to be more liberal using rescue inhalers and continue Symbicort. Call if no better. RTC 6 months, CPX

## 2016-08-19 ENCOUNTER — Emergency Department (HOSPITAL_COMMUNITY): Payer: BLUE CROSS/BLUE SHIELD

## 2016-08-19 ENCOUNTER — Encounter (HOSPITAL_COMMUNITY): Payer: Self-pay | Admitting: Emergency Medicine

## 2016-08-19 ENCOUNTER — Inpatient Hospital Stay (HOSPITAL_COMMUNITY)
Admission: EM | Admit: 2016-08-19 | Discharge: 2016-08-22 | DRG: 195 | Disposition: A | Discharge status: Home / Self Care | Payer: BLUE CROSS/BLUE SHIELD | Attending: Internal Medicine | Admitting: Internal Medicine

## 2016-08-19 DIAGNOSIS — Z7982 Long term (current) use of aspirin: Secondary | ICD-10-CM

## 2016-08-19 DIAGNOSIS — R112 Nausea with vomiting, unspecified: Secondary | ICD-10-CM

## 2016-08-19 DIAGNOSIS — Z09 Encounter for follow-up examination after completed treatment for conditions other than malignant neoplasm: Secondary | ICD-10-CM

## 2016-08-19 DIAGNOSIS — J111 Influenza due to unidentified influenza virus with other respiratory manifestations: Secondary | ICD-10-CM | POA: Diagnosis present

## 2016-08-19 DIAGNOSIS — Z79899 Other long term (current) drug therapy: Secondary | ICD-10-CM

## 2016-08-19 DIAGNOSIS — Z825 Family history of asthma and other chronic lower respiratory diseases: Secondary | ICD-10-CM

## 2016-08-19 DIAGNOSIS — K2100 Gastro-esophageal reflux disease with esophagitis, without bleeding: Secondary | ICD-10-CM | POA: Diagnosis present

## 2016-08-19 DIAGNOSIS — E785 Hyperlipidemia, unspecified: Secondary | ICD-10-CM | POA: Diagnosis present

## 2016-08-19 DIAGNOSIS — J189 Pneumonia, unspecified organism: Secondary | ICD-10-CM | POA: Diagnosis not present

## 2016-08-19 DIAGNOSIS — K219 Gastro-esophageal reflux disease without esophagitis: Secondary | ICD-10-CM | POA: Diagnosis present

## 2016-08-19 DIAGNOSIS — J4520 Mild intermittent asthma, uncomplicated: Secondary | ICD-10-CM | POA: Diagnosis not present

## 2016-08-19 DIAGNOSIS — Z8249 Family history of ischemic heart disease and other diseases of the circulatory system: Secondary | ICD-10-CM

## 2016-08-19 DIAGNOSIS — H9190 Unspecified hearing loss, unspecified ear: Secondary | ICD-10-CM | POA: Diagnosis present

## 2016-08-19 DIAGNOSIS — J45909 Unspecified asthma, uncomplicated: Secondary | ICD-10-CM | POA: Diagnosis present

## 2016-08-19 DIAGNOSIS — Z87891 Personal history of nicotine dependence: Secondary | ICD-10-CM

## 2016-08-19 DIAGNOSIS — J1000 Influenza due to other identified influenza virus with unspecified type of pneumonia: Secondary | ICD-10-CM | POA: Diagnosis not present

## 2016-08-19 DIAGNOSIS — Z792 Long term (current) use of antibiotics: Secondary | ICD-10-CM

## 2016-08-19 LAB — INFLUENZA PANEL BY PCR (TYPE A & B)
Influenza A By PCR: POSITIVE — AB
Influenza B By PCR: NEGATIVE

## 2016-08-19 LAB — BASIC METABOLIC PANEL
Anion gap: 8 (ref 5–15)
BUN: 19 mg/dL (ref 6–20)
CHLORIDE: 104 mmol/L (ref 101–111)
CO2: 22 mmol/L (ref 22–32)
Calcium: 8.5 mg/dL — ABNORMAL LOW (ref 8.9–10.3)
Creatinine, Ser: 1.2 mg/dL (ref 0.61–1.24)
GFR calc non Af Amer: >60 mL/min (ref >60)
Glucose, Bld: 120 mg/dL — ABNORMAL HIGH (ref 65–99)
POTASSIUM: 3.5 mmol/L (ref 3.5–5.1)
SODIUM: 134 mmol/L — AB (ref 135–145)

## 2016-08-19 LAB — CBC WITH DIFFERENTIAL/PLATELET
Basophils Absolute: 0 10*3/uL (ref 0.0–0.1)
Basophils Relative: 0 %
Eosinophils Absolute: 0 10*3/uL (ref 0.0–0.7)
Eosinophils Relative: 0 %
HEMATOCRIT: 37 % — AB (ref 39.0–52.0)
HEMOGLOBIN: 13.2 g/dL (ref 13.0–17.0)
LYMPHS ABS: 1.4 10*3/uL (ref 0.7–4.0)
LYMPHS PCT: 11 %
MCH: 32.6 pg (ref 26.0–34.0)
MCHC: 35.7 g/dL (ref 30.0–36.0)
MCV: 91.4 fL (ref 78.0–100.0)
Monocytes Absolute: 0.7 10*3/uL (ref 0.1–1.0)
Monocytes Relative: 5 %
NEUTROS PCT: 84 %
Neutro Abs: 11.4 10*3/uL — ABNORMAL HIGH (ref 1.7–7.7)
Platelets: 197 10*3/uL (ref 150–400)
RBC: 4.05 MIL/uL — AB (ref 4.22–5.81)
RDW: 13.3 % (ref 11.5–15.5)
WBC: 13.6 10*3/uL — AB (ref 4.0–10.5)

## 2016-08-19 MED ORDER — ACETAMINOPHEN 325 MG PO TABS
650.0000 mg | ORAL_TABLET | Freq: Once | ORAL | Status: DC
Start: 2016-08-19 — End: 2016-08-22

## 2016-08-19 MED ORDER — OSELTAMIVIR PHOSPHATE 75 MG PO CAPS: 75.0000 mg | ORAL_CAPSULE | Freq: Two times a day (BID) | ORAL | 0 refills | Status: DC

## 2016-08-19 MED ORDER — HYDROCODONE-ACETAMINOPHEN 5-325 MG PO TABS
1.0000 | ORAL_TABLET | Freq: Four times a day (QID) | ORAL | Status: DC | PRN
  Administered 2016-08-20 – 2016-08-21 (×3): 1 via ORAL
  Filled 2016-08-19 (×3): qty 1

## 2016-08-19 MED ORDER — PANTOPRAZOLE SODIUM 40 MG PO TBEC
40.0000 mg | DELAYED_RELEASE_TABLET | Freq: Every day | ORAL | Status: DC
  Administered 2016-08-20 – 2016-08-22 (×3): 40 mg via ORAL
  Filled 2016-08-19 (×3): qty 1

## 2016-08-19 MED ORDER — DEXTROSE 5 % IV SOLN
1.0000 g | Freq: Once | INTRAVENOUS | Status: AC
  Administered 2016-08-19: 1 g via INTRAVENOUS
  Filled 2016-08-19: qty 10

## 2016-08-19 MED ORDER — OSELTAMIVIR PHOSPHATE 75 MG PO CAPS
75.0000 mg | ORAL_CAPSULE | Freq: Two times a day (BID) | ORAL | Status: DC
  Administered 2016-08-19 – 2016-08-22 (×6): 75 mg via ORAL
  Filled 2016-08-19 (×6): qty 1

## 2016-08-19 MED ORDER — SODIUM CHLORIDE 0.9 % IV BOLUS (SEPSIS)
1000.0000 mL | Freq: Once | INTRAVENOUS | Status: AC
  Administered 2016-08-19: 1000 mL via INTRAVENOUS

## 2016-08-19 MED ORDER — ALBUTEROL SULFATE (2.5 MG/3ML) 0.083% IN NEBU: 2.5000 mg | INHALATION_SOLUTION | RESPIRATORY_TRACT | Status: DC | PRN

## 2016-08-19 MED ORDER — SODIUM CHLORIDE 0.9 % IV SOLN
INTRAVENOUS | Status: AC
  Administered 2016-08-19: 20:00:00 via INTRAVENOUS

## 2016-08-19 MED ORDER — SODIUM CHLORIDE 0.9 % IV BOLUS (SEPSIS)
2000.0000 mL | Freq: Once | INTRAVENOUS | Status: AC
  Administered 2016-08-19: 2000 mL via INTRAVENOUS

## 2016-08-19 MED ORDER — DEXTROSE 5 % IV SOLN
1.0000 g | INTRAVENOUS | Status: DC
  Administered 2016-08-20 – 2016-08-21 (×2): 1 g via INTRAVENOUS
  Filled 2016-08-19 (×3): qty 10

## 2016-08-19 MED ORDER — ENOXAPARIN SODIUM 40 MG/0.4ML ~~LOC~~ SOLN
40.0000 mg | SUBCUTANEOUS | Status: DC
  Administered 2016-08-19 – 2016-08-21 (×3): 40 mg via SUBCUTANEOUS
  Filled 2016-08-19 (×3): qty 0.4

## 2016-08-19 MED ORDER — FLUTICASONE PROPIONATE 50 MCG/ACT NA SUSP
2.0000 | Freq: Every day | NASAL | Status: DC
  Administered 2016-08-20 – 2016-08-22 (×3): 2 via NASAL
  Filled 2016-08-19: qty 16

## 2016-08-19 MED ORDER — ACETAMINOPHEN 500 MG PO TABS
1000.0000 mg | ORAL_TABLET | Freq: Three times a day (TID) | ORAL | Status: DC | PRN
  Administered 2016-08-22: 1000 mg via ORAL
  Filled 2016-08-19: qty 2

## 2016-08-19 MED ORDER — DOXYCYCLINE HYCLATE 100 MG PO CAPS: 100.0000 mg | ORAL_CAPSULE | Freq: Two times a day (BID) | ORAL | 0 refills | Status: DC

## 2016-08-19 MED ORDER — AZITHROMYCIN 250 MG PO TABS
500.0000 mg | ORAL_TABLET | Freq: Once | ORAL | Status: AC
  Administered 2016-08-19: 500 mg via ORAL
  Filled 2016-08-19: qty 2

## 2016-08-19 MED ORDER — MORPHINE SULFATE (PF) 2 MG/ML IV SOLN: 2.0000 mg | INTRAVENOUS | Status: DC | PRN

## 2016-08-19 MED ORDER — MOMETASONE FURO-FORMOTEROL FUM 200-5 MCG/ACT IN AERO
2.0000 | INHALATION_SPRAY | Freq: Two times a day (BID) | RESPIRATORY_TRACT | Status: DC
  Administered 2016-08-19 – 2016-08-22 (×6): 2 via RESPIRATORY_TRACT
  Filled 2016-08-19: qty 8.8

## 2016-08-19 MED ORDER — LEVALBUTEROL TARTRATE 45 MCG/ACT IN AERO: 1.0000 | INHALATION_SPRAY | RESPIRATORY_TRACT | Status: DC | PRN

## 2016-08-19 MED ORDER — ONDANSETRON HCL 4 MG/2ML IJ SOLN
4.0000 mg | Freq: Four times a day (QID) | INTRAMUSCULAR | Status: DC | PRN
  Administered 2016-08-19 – 2016-08-20 (×3): 4 mg via INTRAVENOUS
  Filled 2016-08-19 (×3): qty 2

## 2016-08-19 MED ORDER — OMEGA-3-ACID ETHYL ESTERS 1 G PO CAPS
1.0000 g | ORAL_CAPSULE | Freq: Two times a day (BID) | ORAL | Status: DC
  Administered 2016-08-20 – 2016-08-22 (×5): 1 g via ORAL
  Filled 2016-08-19 (×5): qty 1

## 2016-08-19 MED ORDER — ADULT MULTIVITAMIN W/MINERALS CH
1.0000 | ORAL_TABLET | Freq: Every day | ORAL | Status: DC
  Administered 2016-08-20 – 2016-08-22 (×3): 1 via ORAL
  Filled 2016-08-19 (×3): qty 1

## 2016-08-19 MED ORDER — DEXTROSE 5 % IV SOLN
500.0000 mg | INTRAVENOUS | Status: DC
  Administered 2016-08-20: 500 mg via INTRAVENOUS
  Filled 2016-08-19: qty 500

## 2016-08-19 MED ORDER — ONDANSETRON HCL 4 MG/2ML IJ SOLN
4.0000 mg | Freq: Once | INTRAMUSCULAR | Status: AC
  Administered 2016-08-19: 4 mg via INTRAVENOUS
  Filled 2016-08-19: qty 2

## 2016-08-19 MED ORDER — DM-GUAIFENESIN ER 30-600 MG PO TB12
1.0000 | ORAL_TABLET | Freq: Two times a day (BID) | ORAL | Status: DC | PRN
  Administered 2016-08-20: 1 via ORAL
  Filled 2016-08-19: qty 1

## 2016-08-19 NOTE — ED Provider Notes (Addendum)
[facility] DEPT Provider Note   CSN: [id number] Arrival date & time: 08/19/16  1423     History   Chief Complaint Chief Complaint  Patient presents with  . flu like symptoms    HPI Brett Berry is a 66 y.o. male.  HPI  Patient presents to the emergency room with complaints of persistent cough and fever. His symptoms started last Thursday. He saw his primary care doctor in the past week and was told that everything was okay. Patient has continued to have fevers at home. He's measured temperatures up to 103. He has had a persistent cough. His chest does hurt and sometimes when he is coughing. He denies any trouble with vomiting but has had a few loose stools each day. He complains of some sinus congestion and ear congestion. He denies any sore throat or abdominal pain. No dysuria. No rashes. Patient did get a flu shot this year. Past Medical History:  Diagnosis Date  . Allergic rhinitis   . Asthma   . Bilateral knee pain 2016   x-ray showed medial compartment arthritis, worse on right than left  . Erectile dysfunction 07/30/2014  . Hearing difficulty    has aids  . Hyperlipidemia    intolerant to zocor  . Sinusitis, chronic     Patient Active Problem List   Diagnosis Date Noted  . PCP NOTES >>>>>>>>>>>>>>>>>> 02/18/2016  . DJD (knees, celebrex, ortho Dr Sharol Given) 01/08/2015  . Erectile dysfunction 07/30/2014  . Annual physical exam 12/29/2013  . Non-functioning tympanostomy tube 09/10/2011  . Dysphagia 09/10/2011  . OTHER CHRONIC SINUSITIS 02/05/2010  . HEADACHE 02/05/2010  . Anxiety -insomnia 01/04/2009  . Hyperlipidemia 01/04/2009  . ALLERGIC RHINITIS 10/27/2007  . Asthma 10/27/2007    Past Surgical History:  Procedure Laterality Date  . CATARACT EXTRACTION Right 2016  . MASTOIDECTOMY     right   . right hand surg     for infection in '07  . SINUS ENDO WITH FUSION         Home Medications    Prior to Admission medications   Medication Sig Start Date  End Date Taking? Authorizing Provider  acetaminophen (TYLENOL) 500 MG tablet Take 1,000-1,500 mg by mouth every 3 (three) hours as needed for mild pain, moderate pain, fever or headache.   Yes Historical Provider, MD  aspirin 81 MG chewable tablet Chew 324 mg by mouth once.   Yes Historical Provider, MD  budesonide-formoterol (SYMBICORT) 160-4.5 MCG/ACT inhaler Inhale 2 puffs into the lungs 2 (two) times daily. 02/17/16  Yes Colon Branch, MD  celecoxib (CELEBREX) 100 MG capsule Take 1 capsule (100 mg total) by mouth 2 (two) times daily as needed. Patient taking differently: Take 100 mg by mouth 2 (two) times daily as needed for mild pain.  08/13/16  Yes Colon Branch, MD  dextromethorphan-guaiFENesin [facility] DM) 30-600 MG 12hr tablet Take 1 tablet by mouth 2 (two) times daily as needed for cough.   Yes Historical Provider, MD  esomeprazole (NEXIUM) 40 MG capsule Take 1 capsule (40 mg total) by mouth 2 (two) times daily before a meal. Patient taking differently: Take 40 mg by mouth daily with breakfast. May take 40mg  in the evening if he feels like he needs another one 07/03/16  Yes Colon Branch, MD  fluticasone [facility]) 50 MCG/ACT nasal spray Place 2 sprays into the nose daily.     Yes Historical Provider, MD  levalbuterol (XOPENEX HFA) 45 MCG/ACT inhaler Inhale 1-2 puffs into the  lungs daily as needed for wheezing or shortness of breath. Patient taking differently: Inhale 1-2 puffs into the lungs every 4 (four) hours as needed for wheezing or shortness of breath.  02/17/16  Yes Colon Branch, MD  Multiple Vitamin (MULTIVITAMIN WITH MINERALS) TABS tablet Take 1 tablet by mouth daily.   Yes Historical Provider, MD  Omega-3 Fatty Acids (FISH OIL) 1000 MG CAPS Take 1 capsule by mouth 2 (two) times daily.   Yes Historical Provider, MD  doxycycline (VIBRAMYCIN) 100 MG capsule Take 1 capsule (100 mg total) by mouth 2 (two) times daily. 08/19/16   Dorie Rank, MD  oseltamivir (TAMIFLU) 75 MG capsule Take 1 capsule (75 mg  total) by mouth 2 (two) times daily. 08/19/16   Dorie Rank, MD    Family History Family History  Problem Relation Age of Onset  . COPD Mother   . Breast cancer Mother   . Heart disease Mother     age early 24s  . Alzheimer's disease Father   . Lung cancer Father     smoker  . Prostate cancer Father     died age 89  . Heart disease Father     age 62  . Colon cancer Neg Hx     Social History Social History  Substance Use Topics  . Smoking status: Former Smoker    Quit date: 09/08/1973  . Smokeless tobacco: Never Used  . Alcohol use 4.8 oz/week    8 Cans of beer per week     Comment: 2-3 times week     Allergies   Patient has no known allergies.   Review of Systems Review of Systems  All other systems reviewed and are negative.    Physical Exam Updated Vital Signs BP 120/62   Pulse 63   Temp 97.9 F (36.6 C) (Oral)   Resp 16   SpO2 97%   Physical Exam  Constitutional: He appears well-developed and well-nourished. No distress.  HENT:  Head: Normocephalic and atraumatic.  Right Ear: External ear normal.  Left Ear: External ear normal.  Eyes: Conjunctivae are normal. Right eye exhibits no discharge. Left eye exhibits no discharge. No scleral icterus.  Neck: Neck supple. No tracheal deviation present.  Cardiovascular: Normal rate, regular rhythm and intact distal pulses.   Pulmonary/Chest: Effort normal and breath sounds normal. No stridor. No respiratory distress. He has no wheezes. He has no rales.  Abdominal: Soft. Bowel sounds are normal. He exhibits no distension. There is no tenderness. There is no rebound and no guarding.  Musculoskeletal: He exhibits no edema or tenderness.  Neurological: He is alert. He has normal strength. No cranial nerve deficit (no facial droop, extraocular movements intact, no slurred speech) or sensory deficit. He exhibits normal muscle tone. He displays no seizure activity. Coordination normal.  Skin: Skin is warm and dry. No rash  noted.  Psychiatric: He has a normal mood and affect.  Nursing note and vitals reviewed.    ED Treatments / Results  Labs (all labs ordered are listed, but only abnormal results are displayed) Labs Reviewed  BASIC METABOLIC PANEL - Abnormal; Notable for the following:       Result Value   Sodium 134 (*)    Glucose, Bld 120 (*)    Calcium 8.5 (*)    All other components within normal limits  CBC WITH DIFFERENTIAL/PLATELET - Abnormal; Notable for the following:    WBC 13.6 (*)    RBC 4.05 (*)    HCT  37.0 (*)    Neutro Abs 11.4 (*)    All other components within normal limits  INFLUENZA PANEL BY PCR (TYPE A & B) - Abnormal; Notable for the following:    Influenza A By PCR POSITIVE (*)    All other components within normal limits     Radiology Dg Chest 2 View  Result Date: 08/19/2016 CLINICAL DATA:  Cough and fever for 4 days. EXAM: CHEST  2 VIEW COMPARISON:  01/01/2013 FINDINGS: Two views study shows patchy bilateral airspace disease suggesting multifocal pneumonia. Cardiopericardial silhouette is upper limits of normal for size. No pleural effusion. The visualized bony structures of the thorax are intact. Telemetry leads overlie the chest. IMPRESSION: Patchy bilateral airspace disease suggests multifocal pneumonia. Electronically Signed   By: Misty Stanley M.D.   On: 08/19/2016 15:29    Procedures Procedures (including critical care time)  Medications Ordered in ED Medications  acetaminophen (TYLENOL) tablet 650 mg (650 mg Oral Not Given 08/19/16 1506)  sodium chloride 0.9 % bolus 1,000 mL (0 mLs Intravenous Stopped 08/19/16 1638)  sodium chloride 0.9 % bolus 2,000 mL (2,000 mLs Intravenous New Bag/Given 08/19/16 1639)  cefTRIAXone (ROCEPHIN) 1 g in dextrose 5 % 50 mL IVPB (1 g Intravenous New Bag/Given 08/19/16 1640)  azithromycin (ZITHROMAX) tablet 500 mg (500 mg Oral Given 08/19/16 1639)     Initial Impression / Assessment and Plan / ED Course  I have reviewed the triage  vital signs and the nursing notes.  Pertinent labs & imaging results that were available during my care of the patient were reviewed by me and considered in my medical decision making (see chart for details).  Clinical Course as of Aug 20 1803  Wed Aug 19, 2016  1651 Chest x-ray shows pneumonia. IV antibiotics started for any acquired pneumonia. Influenza test is still pending. Clinically, patient remained stable. Vital signs are normal. He is not hypoxic.  [id number] Plan was for outpatient treatment.  Pt started vomiting now unfortunately.     [JK]    Clinical Course User Index [JK] Dorie Rank, MD   Patient's chest x-ray shows pneumonia. Patient's influenza test is also positive. His pneumonia is either an influenza pneumonia or could be a bacterial superinfection.   I'll start the patient on doxycycline to cover for any acquired pneumonia. He was given Rocephin and azithromycin in the emergency room. The use of Tamiflu is somewhat contraversial but there are recommendations for its use in patients who have complications of influenza outside the 48 hour onset   Patient has remained stable in the emergency room. He is not tachycardic or hypoxic. He is not tachypnea.  Patient appears stable for outpatient treatment.  Final Clinical Impressions(s) / ED Diagnoses   Final diagnoses:  Influenza  Community acquired pneumonia, unspecified laterality    New Prescriptions New Prescriptions   DOXYCYCLINE (VIBRAMYCIN) 100 MG CAPSULE    Take 1 capsule (100 mg total) by mouth 2 (two) times daily.   OSELTAMIVIR (TAMIFLU) 75 MG CAPSULE    Take 1 capsule (75 mg total) by mouth 2 (two) times daily.     Dorie Rank, MD 08/19/16 1735  Pt started vomiting in the ED.  Initially I was planning on letting him go home.  At this point I think he will need to be admitted considering his influenza, multifocal pna and inability to keep down medications, food, etc.    Dorie Rank, MD 08/19/16 1806

## 2016-08-19 NOTE — Discharge Instructions (Signed)
Take the doxycycline, the antibiotic for the pneumonia, until complete. Tamiflu is a medication for influenza. Drink plenty of fluids.. Take Tylenol or Advil as needed for fever. Return to the emergency room for worsening symptoms. Follow up with your doctor next few days to make sure you are improving

## 2016-08-19 NOTE — ED Notes (Signed)
Bed: WA09 Expected date:  Expected time:  Means of arrival:  Comments: EMS 

## 2016-08-19 NOTE — H&P (Signed)
History and Physical    Brett Berry [id number] DOB: 1951-01-07 DOA: 08/19/2016  PCP: Kathlene November, MD   Patient coming from: Home  Chief Complaint: Cough, fever, malaise   HPI: Brett Berry is a 66 y.o. male with medical history significant for persistent asthma, allergic rhinitis, and hyperlipidemia who presents to the emergency department with productive cough, fevers, and malaise. Patient reports that he had been in his usual state of health until approximately 5 days ago when he developed sinus congestion with rhinorrhea, sore throat, and malaise. He seemed developed a productive cough and then nausea. He was seen by his primary care physician for these complaints 2 days ago, diagnosed with a viral syndrome, and advised to follow-up if no improvement. Unfortunately, the patient has continued to worsen and has found no relief with TheraFlu and his home inhalers. He denies any significant wheezing and denies lower extremity edema, orthopnea, or PND. There has been some discomfort across the anterior chest with coughing or deep inspiration, but no exertional chest pain.   ED Course: Upon arrival to the ED, patient is found to be afebrile, saturating adequately on room air, and with vital signs stable. EKG features a normal sinus rhythm and chest x-ray is notable for patchy bilateral airspace disease suggestive of a multifocal pneumonia. Chemistry panel is unremarkable and CBC features a leukocytosis to 13,600. Flu PCR is positive for influenza A. Patient was given 3 L of normal saline, Zofran, Tamiflu, and Rocephin and azithromycin. He was considered for outpatient treatment, but has been nauseous and vomiting in the ED, raising concern that he will not tolerate oral antibiotics. He will be observed on the medical-surgical unit for initial treatment of community-acquired pneumonia with influenza a and possible bacterial superinfection.  Review of Systems:  All other systems reviewed and apart from  HPI, are negative.  Past Medical History:  Diagnosis Date  . Allergic rhinitis   . Asthma   . Bilateral knee pain 2016   x-ray showed medial compartment arthritis, worse on right than left  . Erectile dysfunction 07/30/2014  . Hearing difficulty    has aids  . Hyperlipidemia    intolerant to zocor  . Sinusitis, chronic     Past Surgical History:  Procedure Laterality Date  . CATARACT EXTRACTION Right 2016  . MASTOIDECTOMY     right   . right hand surg     for infection in '07  . SINUS ENDO WITH FUSION       reports that he quit smoking about 42 years ago. He has never used smokeless tobacco. He reports that he drinks about 4.8 oz of alcohol per week . He reports that he does not use drugs.  No Known Allergies  Family History  Problem Relation Age of Onset  . COPD Mother   . Breast cancer Mother   . Heart disease Mother     age early 50s  . Alzheimer's disease Father   . Lung cancer Father     smoker  . Prostate cancer Father     died age 68  . Heart disease Father     age 22  . Colon cancer Neg Hx      Prior to Admission medications   Medication Sig Start Date End Date Taking? Authorizing Provider  acetaminophen (TYLENOL) 500 MG tablet Take 1,000-1,500 mg by mouth every 3 (three) hours as needed for mild pain, moderate pain, fever or headache.   Yes Historical Provider, MD  aspirin 81  MG chewable tablet Chew 324 mg by mouth once.   Yes Historical Provider, MD  budesonide-formoterol (SYMBICORT) 160-4.5 MCG/ACT inhaler Inhale 2 puffs into the lungs 2 (two) times daily. 02/17/16  Yes Colon Branch, MD  celecoxib (CELEBREX) 100 MG capsule Take 1 capsule (100 mg total) by mouth 2 (two) times daily as needed. Patient taking differently: Take 100 mg by mouth 2 (two) times daily as needed for mild pain.  08/13/16  Yes Colon Branch, MD  dextromethorphan-guaiFENesin [facility] DM) 30-600 MG 12hr tablet Take 1 tablet by mouth 2 (two) times daily as needed for cough.   Yes Historical  Provider, MD  esomeprazole (NEXIUM) 40 MG capsule Take 1 capsule (40 mg total) by mouth 2 (two) times daily before a meal. Patient taking differently: Take 40 mg by mouth daily with breakfast. May take 40mg  in the evening if he feels like he needs another one 07/03/16  Yes Colon Branch, MD  fluticasone [facility]) 50 MCG/ACT nasal spray Place 2 sprays into the nose daily.     Yes Historical Provider, MD  levalbuterol [facility] HFA) 45 MCG/ACT inhaler Inhale 1-2 puffs into the lungs daily as needed for wheezing or shortness of breath. Patient taking differently: Inhale 1-2 puffs into the lungs every 4 (four) hours as needed for wheezing or shortness of breath.  02/17/16  Yes Colon Branch, MD  Multiple Vitamin (MULTIVITAMIN WITH MINERALS) TABS tablet Take 1 tablet by mouth daily.   Yes Historical Provider, MD  Omega-3 Fatty Acids (FISH OIL) 1000 MG CAPS Take 1 capsule by mouth 2 (two) times daily.   Yes Historical Provider, MD  doxycycline (VIBRAMYCIN) 100 MG capsule Take 1 capsule (100 mg total) by mouth 2 (two) times daily. 08/19/16   Dorie Rank, MD  oseltamivir (TAMIFLU) 75 MG capsule Take 1 capsule (75 mg total) by mouth 2 (two) times daily. 08/19/16   Dorie Rank, MD    Physical Exam: Vitals:   08/19/16 1424 08/19/16 1430 08/19/16 1824  BP:  120/62 165/76  Pulse:  63 82  Resp:  16 15  Temp:  97.9 F (36.6 C)   TempSrc:  Oral   SpO2: 98% 97% 95%      Constitutional: No acute distress. No pallor or diaphoresis. Appears uncomfortable, clutching emesis bag.  Eyes: PERTLA, lids and conjunctivae normal ENMT: Mucous membranes are moist. Posterior pharynx clear of any exudate or lesions.   Neck: normal, supple, no masses, no thyromegaly Respiratory: Mild tachypnea, rhonchi throughout. No accessory muscle use.  Cardiovascular: S1 & S2 heard, regular rate and rhythm, no significant murmur. No extremity edema. No significant JVD. Abdomen: No distension, no tenderness, no masses palpated. Bowel sounds  normal.  Musculoskeletal: no clubbing / cyanosis. No joint deformity upper and lower extremities. Normal muscle tone.  Skin: no significant rashes, lesions, ulcers. Warm, dry, well-perfused. Neurologic: CN 2-12 grossly intact. Sensation intact, DTR normal. Strength 5/5 in all 4 limbs.  Psychiatric: Normal judgment and insight. Alert and oriented x 3. Normal mood and affect.     Labs on Admission: I have personally reviewed following labs and imaging studies  CBC:  Recent Labs Lab 08/19/16 1448  WBC 13.6*  NEUTROABS 11.4*  HGB 13.2  HCT 37.0*  MCV 91.4  PLT [id number]   Basic Metabolic Panel:  Recent Labs Lab 08/19/16 1448  NA 134*  K 3.5  CL 104  CO2 22  GLUCOSE 120*  BUN 19  CREATININE 1.20  CALCIUM 8.5*   GFR: Estimated  Creatinine Clearance: 65.1 mL/min (by C-G formula based on SCr of 1.2 mg/dL). Liver Function Tests: No results for input(s): AST, ALT, ALKPHOS, BILITOT, PROT, ALBUMIN in the last 168 hours. No results for input(s): LIPASE, AMYLASE in the last 168 hours. No results for input(s): AMMONIA in the last 168 hours. Coagulation Profile: No results for input(s): INR, PROTIME in the last 168 hours. Cardiac Enzymes: No results for input(s): CKTOTAL, CKMB, CKMBINDEX, TROPONINI in the last 168 hours. BNP (last 3 results) No results for input(s): PROBNP in the last 8760 hours. HbA1C: No results for input(s): HGBA1C in the last 72 hours. CBG: No results for input(s): GLUCAP in the last 168 hours. Lipid Profile: No results for input(s): CHOL, HDL, LDLCALC, TRIG, CHOLHDL, LDLDIRECT in the last 72 hours. Thyroid Function Tests: No results for input(s): TSH, T4TOTAL, FREET4, T3FREE, THYROIDAB in the last 72 hours. Anemia Panel: No results for input(s): VITAMINB12, FOLATE, FERRITIN, TIBC, IRON, RETICCTPCT in the last 72 hours. Urine analysis:    Component Value Date/Time   COLORURINE YELLOW 12/19/2008 [place] 12/19/2008 0733   LABSPEC >=1.030  12/19/2008 0733   PHURINE 5.5 12/19/2008 0733   GLUCOSEU NEGATIVE 12/19/2008 0733   BILIRUBINUR NEGATIVE 12/19/2008 0733   KETONESUR TRACE 12/19/2008 0733   UROBILINOGEN 0.2 12/19/2008 0733   NITRITE NEGATIVE 12/19/2008 0733   LEUKOCYTESUR NEGATIVE 12/19/2008 0733   Sepsis Labs: @LABRCNTIP (procalcitonin:4,lacticidven:4) )No results found for this or any previous visit (from the past 240 hour(s)).   Radiological Exams on Admission: Dg Chest 2 View  Result Date: 08/19/2016 CLINICAL DATA:  Cough and fever for 4 days. EXAM: CHEST  2 VIEW COMPARISON:  01/01/2013 FINDINGS: Two views study shows patchy bilateral airspace disease suggesting multifocal pneumonia. Cardiopericardial silhouette is upper limits of normal for size. No pleural effusion. The visualized bony structures of the thorax are intact. Telemetry leads overlie the chest. IMPRESSION: Patchy bilateral airspace disease suggests multifocal pneumonia. Electronically Signed   By: Misty Stanley M.D.   On: 08/19/2016 15:29    EKG: Independently reviewed. Sinus rhythm   Assessment/Plan  1. CAP, influenza A  - Presents with fevers at home, cough, malaise, N/V - Found to have leukocytosis, rhonchi throughout, CXR with bilateral infiltrates, and positive flu PCR  - Pt considered for outpatient treatment, but has been vomiting in ED and not tolerating PO therapies  - Check sputum cultures and strep pneumo urine antigen  - Concern for secondary bacterial PNA and will be continued on treatment with Rocephin, azithromycin, and Tamiflu - Supportive care with APAP, antiemetics, prn mucolytics   2. Asthma  - Moderate-persistent by history  - No wheezing or obstructed breathing on admission  - Continue scheduled ICS/LABA, prn Xoponex   3. Nausea, vomiting  - No abdominal pain, no diarrhea or constipation - Likely secondary to PNA  - Continue treatment with IVF and Zofran, monitor on replete lytes prn   4. GERD - EGD from February 2013  with erosive esophagitis  - Treated with scheduled Nexium at home, will continue PPI here     DVT prophylaxis: sq Lovenox Code Status: Full  Family Communication: Discussed with patient Disposition Plan: Observe on med-surg Consults called: None Admission status: Observation    Vianne Bulls, MD [facility] Pager [phone]  If 7PM-7AM, please contact night-coverage [email or web address] Password [facility]  08/19/2016, 6:55 PM

## 2016-08-19 NOTE — ED Triage Notes (Signed)
EMS report flu like symptoms x 4 days. No further symptoms per EMS.

## 2016-08-20 DIAGNOSIS — J101 Influenza due to other identified influenza virus with other respiratory manifestations: Secondary | ICD-10-CM

## 2016-08-20 DIAGNOSIS — K219 Gastro-esophageal reflux disease without esophagitis: Secondary | ICD-10-CM | POA: Diagnosis present

## 2016-08-20 DIAGNOSIS — J111 Influenza due to unidentified influenza virus with other respiratory manifestations: Secondary | ICD-10-CM | POA: Diagnosis present

## 2016-08-20 DIAGNOSIS — Z792 Long term (current) use of antibiotics: Secondary | ICD-10-CM | POA: Diagnosis not present

## 2016-08-20 DIAGNOSIS — J189 Pneumonia, unspecified organism: Secondary | ICD-10-CM

## 2016-08-20 DIAGNOSIS — Z87891 Personal history of nicotine dependence: Secondary | ICD-10-CM | POA: Diagnosis not present

## 2016-08-20 DIAGNOSIS — Z8249 Family history of ischemic heart disease and other diseases of the circulatory system: Secondary | ICD-10-CM | POA: Diagnosis not present

## 2016-08-20 DIAGNOSIS — K210 Gastro-esophageal reflux disease with esophagitis: Secondary | ICD-10-CM | POA: Diagnosis present

## 2016-08-20 DIAGNOSIS — Z79899 Other long term (current) drug therapy: Secondary | ICD-10-CM | POA: Diagnosis not present

## 2016-08-20 DIAGNOSIS — H9190 Unspecified hearing loss, unspecified ear: Secondary | ICD-10-CM | POA: Diagnosis present

## 2016-08-20 DIAGNOSIS — J4520 Mild intermittent asthma, uncomplicated: Secondary | ICD-10-CM

## 2016-08-20 DIAGNOSIS — Z7982 Long term (current) use of aspirin: Secondary | ICD-10-CM | POA: Diagnosis not present

## 2016-08-20 DIAGNOSIS — J45909 Unspecified asthma, uncomplicated: Secondary | ICD-10-CM | POA: Diagnosis present

## 2016-08-20 DIAGNOSIS — J1000 Influenza due to other identified influenza virus with unspecified type of pneumonia: Secondary | ICD-10-CM | POA: Diagnosis present

## 2016-08-20 DIAGNOSIS — Z825 Family history of asthma and other chronic lower respiratory diseases: Secondary | ICD-10-CM | POA: Diagnosis not present

## 2016-08-20 DIAGNOSIS — E785 Hyperlipidemia, unspecified: Secondary | ICD-10-CM | POA: Diagnosis present

## 2016-08-20 LAB — HIV ANTIBODY (ROUTINE TESTING W REFLEX): HIV Screen 4th Generation wRfx: NONREACTIVE

## 2016-08-20 LAB — STREP PNEUMONIAE URINARY ANTIGEN: STREP PNEUMO URINARY ANTIGEN: POSITIVE — AB

## 2016-08-20 MED ORDER — INFLUENZA VAC SPLIT QUAD 0.5 ML IM SUSY
0.5000 mL | PREFILLED_SYRINGE | INTRAMUSCULAR | Status: DC
  Filled 2016-08-20: qty 0.5

## 2016-08-20 NOTE — Progress Notes (Signed)
PROGRESS NOTE    Brett Berry  [id number] DOB: 1950/10/21 DOA: 08/19/2016 PCP: Kathlene November, MD    Brief Narrative: Brett Berry is a 66 y.o. male with medical history significant for persistent asthma, allergic rhinitis, and hyperlipidemia who presents to the emergency department with productive cough, fevers, and malaise.  Assessment & Plan:   Principal Problem:   CAP (community acquired pneumonia) Active Problems:   Asthma   Influenza A   GERD with esophagitis   Nausea & vomiting   Influenza A and CAP: tamiflu ordered, and rocephin and zithromax added on.  Oxygen sats on RA are good.  Blood cultures are pending and negative.  Sputum cultures pending.  urine fore strep antigen positive.    Asthma,  Scattered wheezing today, resume bronchidilators   Nausea and vomiting  Resolved.   GERD: resume PPI. .        DVT prophylaxis: (Lovenox/) Code Status: (Full) Family Communication: none at bedside. Disposition Plan: pending further eval.    Consultants:   None.    Procedures:none.    Antimicrobials: TAMIFLU  ROCEPHIN  ZITHROMAX.   Subjective: FEELING A LOT BETTER THAN ON ADMISSION.   Objective: Vitals:   08/19/16 2043 08/19/16 2256 08/20/16 0547 08/20/16 0842  BP: 161/79 (!) 157/66 134/66   Pulse: 78 70 85   Resp: 22     Temp:  99.6 F (37.6 C) 98.8 F (37.1 C)   TempSrc:  Oral Oral   SpO2: 96% 96% 98% 95%  Weight: 83 kg (183 lb)     Height: 5\' 10"  (1.778 m)       Intake/Output Summary (Last 24 hours) at 08/20/16 1353 Last data filed at 08/20/16 0924  Gross per 24 hour  Intake           3941.5 ml  Output                1 ml  Net           3940.5 ml   Filed Weights   08/19/16 2043  Weight: 83 kg (183 lb)    Examination:  General exam: Appears calm and comfortable  Respiratory system: scattered wheezing posteriorly and rhonchi .  Cardiovascular system: S1 & S2 heard, RRR. No JVD, murmurs, rubs, gallops or clicks. No pedal  edema. Gastrointestinal system: Abdomen is nondistended, soft and nontender. No organomegaly or masses felt. Normal bowel sounds heard. Central nervous system: Alert and oriented. No focal neurological deficits. Extremities: Symmetric 5 x 5 power. Skin: No rashes, lesions or ulcers Psychiatry: Judgement and insight appear normal. Mood & affect appropriate.     Data Reviewed: I have personally reviewed following labs and imaging studies  CBC:  Recent Labs Lab 08/19/16 1448  WBC 13.6*  NEUTROABS 11.4*  HGB 13.2  HCT 37.0*  MCV 91.4  PLT [id number]   Basic Metabolic Panel:  Recent Labs Lab 08/19/16 1448  NA 134*  K 3.5  CL 104  CO2 22  GLUCOSE 120*  BUN 19  CREATININE 1.20  CALCIUM 8.5*   GFR: Estimated Creatinine Clearance: 65.1 mL/min (by C-G formula based on SCr of 1.2 mg/dL). Liver Function Tests: No results for input(s): AST, ALT, ALKPHOS, BILITOT, PROT, ALBUMIN in the last 168 hours. No results for input(s): LIPASE, AMYLASE in the last 168 hours. No results for input(s): AMMONIA in the last 168 hours. Coagulation Profile: No results for input(s): INR, PROTIME in the last 168 hours. Cardiac Enzymes: No results for input(s): CKTOTAL, CKMB,  CKMBINDEX, TROPONINI in the last 168 hours. BNP (last 3 results) No results for input(s): PROBNP in the last 8760 hours. HbA1C: No results for input(s): HGBA1C in the last 72 hours. CBG: No results for input(s): GLUCAP in the last 168 hours. Lipid Profile: No results for input(s): CHOL, HDL, LDLCALC, TRIG, CHOLHDL, LDLDIRECT in the last 72 hours. Thyroid Function Tests: No results for input(s): TSH, T4TOTAL, FREET4, T3FREE, THYROIDAB in the last 72 hours. Anemia Panel: No results for input(s): VITAMINB12, FOLATE, FERRITIN, TIBC, IRON, RETICCTPCT in the last 72 hours. Sepsis Labs: No results for input(s): PROCALCITON, LATICACIDVEN in the last 168 hours.  No results found for this or any previous visit (from the past 240  hour(s)).       Radiology Studies: Dg Chest 2 View  Result Date: 08/19/2016 CLINICAL DATA:  Cough and fever for 4 days. EXAM: CHEST  2 VIEW COMPARISON:  01/01/2013 FINDINGS: Two views study shows patchy bilateral airspace disease suggesting multifocal pneumonia. Cardiopericardial silhouette is upper limits of normal for size. No pleural effusion. The visualized bony structures of the thorax are intact. Telemetry leads overlie the chest. IMPRESSION: Patchy bilateral airspace disease suggests multifocal pneumonia. Electronically Signed   By: Misty Stanley M.D.   On: 08/19/2016 15:29        Scheduled Meds: . acetaminophen  650 mg Oral Once  . azithromycin  500 mg Intravenous Q24H  . cefTRIAXone (ROCEPHIN)  IV  1 g Intravenous Q24H  . enoxaparin (LOVENOX) injection  40 mg Subcutaneous Q24H  . fluticasone  2 spray Each Nare Daily  . [START ON 08/21/2016] Influenza vac split quadrivalent PF  0.5 mL Intramuscular Tomorrow-1000  . mometasone-formoterol  2 puff Inhalation BID  . multivitamin with minerals  1 tablet Oral Daily  . omega-3 acid ethyl esters  1 g Oral BID  . oseltamivir  75 mg Oral BID  . pantoprazole  40 mg Oral Daily   Continuous Infusions:   LOS: 0 days    Time spent:33 minutes.     Hosie Poisson, MD [facility] Pager [phone]  If 7PM-7AM, please contact night-coverage [email or web address] Password [email or web address] 08/20/2016, 1:53 PM

## 2016-08-21 LAB — CBC WITH DIFFERENTIAL/PLATELET
BASOS ABS: 0 10*3/uL (ref 0.0–0.1)
BASOS PCT: 0 %
Eosinophils Absolute: 0.2 10*3/uL (ref 0.0–0.7)
Eosinophils Relative: 3 %
HCT: 33.4 % — ABNORMAL LOW (ref 39.0–52.0)
HEMOGLOBIN: 12 g/dL — AB (ref 13.0–17.0)
Lymphocytes Relative: 23 %
Lymphs Abs: 2.1 10*3/uL (ref 0.7–4.0)
MCH: 33.1 pg (ref 26.0–34.0)
MCHC: 35.9 g/dL (ref 30.0–36.0)
MCV: 92 fL (ref 78.0–100.0)
Monocytes Absolute: 0.6 10*3/uL (ref 0.1–1.0)
Monocytes Relative: 7 %
NEUTROS ABS: 6 10*3/uL (ref 1.7–7.7)
NEUTROS PCT: 67 %
Platelets: 251 10*3/uL (ref 150–400)
RBC: 3.63 MIL/uL — ABNORMAL LOW (ref 4.22–5.81)
RDW: 13.7 % (ref 11.5–15.5)
WBC: 8.9 10*3/uL (ref 4.0–10.5)

## 2016-08-21 LAB — BASIC METABOLIC PANEL
ANION GAP: 6 (ref 5–15)
BUN: 15 mg/dL (ref 6–20)
CALCIUM: 8.6 mg/dL — AB (ref 8.9–10.3)
CHLORIDE: 106 mmol/L (ref 101–111)
CO2: 27 mmol/L (ref 22–32)
Creatinine, Ser: 0.98 mg/dL (ref 0.61–1.24)
GFR calc non Af Amer: >60 mL/min (ref >60)
Glucose, Bld: 142 mg/dL — ABNORMAL HIGH (ref 65–99)
Potassium: 3.5 mmol/L (ref 3.5–5.1)
Sodium: 139 mmol/L (ref 135–145)

## 2016-08-21 MED ORDER — AZITHROMYCIN 250 MG PO TABS
500.0000 mg | ORAL_TABLET | Freq: Every day | ORAL | Status: DC
  Administered 2016-08-21 – 2016-08-22 (×2): 500 mg via ORAL
  Filled 2016-08-21 (×2): qty 2

## 2016-08-21 MED ORDER — IPRATROPIUM-ALBUTEROL 0.5-2.5 (3) MG/3ML IN SOLN
3.0000 mL | Freq: Three times a day (TID) | RESPIRATORY_TRACT | Status: DC
  Administered 2016-08-21 – 2016-08-22 (×4): 3 mL via RESPIRATORY_TRACT
  Filled 2016-08-21 (×5): qty 3

## 2016-08-21 MED ORDER — PREDNISONE 20 MG PO TABS
40.0000 mg | ORAL_TABLET | Freq: Every day | ORAL | Status: DC
  Administered 2016-08-21 – 2016-08-22 (×2): 40 mg via ORAL
  Filled 2016-08-21 (×2): qty 2

## 2016-08-21 NOTE — Progress Notes (Signed)
PROGRESS NOTE    Brett Berry  [id number] DOB: 1951-04-29 DOA: 08/19/2016 PCP: Kathlene November, MD    Brief Narrative: Brett Berry is a 66 y.o. male with medical history significant for persistent asthma, allergic rhinitis, and hyperlipidemia who presents to the emergency department with productive cough, fevers, and malaise.  Assessment & Plan:   Principal Problem:   CAP (community acquired pneumonia) Active Problems:   Asthma   Influenza A   GERD with esophagitis   Nausea & vomiting   Influenza A and CAP: tamiflu ordered, and rocephin and zithromax added on.  Oxygen sats on RA are good.  Blood cultures are pending and negative.  Sputum cultures pending.  urine fore strep antigen positive.  Added prednisone for wheezing.    Asthma,  Scattered wheezing today, resume bronchodilators, added prednisone.    Nausea and vomiting  Resolved.   GERD: resume PPI. .        DVT prophylaxis: (Lovenox/) Code Status: (Full) Family Communication: none at bedside. Disposition Plan: pending further eval.    Consultants:   None.    Procedures:none.    Antimicrobials: TAMIFLU  ROCEPHIN  ZITHROMAX.   Subjective: No new complaints.   Objective: Vitals:   08/20/16 2153 08/21/16 0513 08/21/16 1000 08/21/16 1428  BP: (!) 156/81 135/71  (!) 157/79  Pulse: 72 68  60  Resp: 19 18  18   Temp: 98.7 F (37.1 C) 97.8 F (36.6 C)  98.4 F (36.9 C)  TempSrc: Oral Oral  Oral  SpO2: 97% 96% 94% 97%  Weight:      Height:        Intake/Output Summary (Last 24 hours) at 08/21/16 1501 Last data filed at 08/21/16 1046  Gross per 24 hour  Intake             1020 ml  Output                0 ml  Net             1020 ml   Filed Weights   08/19/16 2043  Weight: 83 kg (183 lb)    Examination:  General exam: Appears calm and comfortable  Respiratory system: scattered wheezing posteriorly and rhonchi .  Cardiovascular system: S1 & S2 heard, RRR. No JVD, murmurs, rubs,  gallops or clicks. No pedal edema. Gastrointestinal system: Abdomen is nondistended, soft and nontender. No organomegaly or masses felt. Normal bowel sounds heard. Central nervous system: Alert and oriented. No focal neurological deficits. Extremities: Symmetric 5 x 5 power. Skin: No rashes, lesions or ulcers Psychiatry: Judgement and insight appear normal. Mood & affect appropriate.     Data Reviewed: I have personally reviewed following labs and imaging studies  CBC:  Recent Labs Lab 08/19/16 1448 08/21/16 1020  WBC 13.6* 8.9  NEUTROABS 11.4* 6.0  HGB 13.2 12.0*  HCT 37.0* 33.4*  MCV 91.4 92.0  PLT 197 [id number]   Basic Metabolic Panel:  Recent Labs Lab 08/19/16 1448 08/21/16 1020  NA 134* 139  K 3.5 3.5  CL 104 106  CO2 22 27  GLUCOSE 120* 142*  BUN 19 15  CREATININE 1.20 0.98  CALCIUM 8.5* 8.6*   GFR: Estimated Creatinine Clearance: 79.7 mL/min (by C-G formula based on SCr of 0.98 mg/dL). Liver Function Tests: No results for input(s): AST, ALT, ALKPHOS, BILITOT, PROT, ALBUMIN in the last 168 hours. No results for input(s): LIPASE, AMYLASE in the last 168 hours. No results for input(s): AMMONIA in the  last 168 hours. Coagulation Profile: No results for input(s): INR, PROTIME in the last 168 hours. Cardiac Enzymes: No results for input(s): CKTOTAL, CKMB, CKMBINDEX, TROPONINI in the last 168 hours. BNP (last 3 results) No results for input(s): PROBNP in the last 8760 hours. HbA1C: No results for input(s): HGBA1C in the last 72 hours. CBG: No results for input(s): GLUCAP in the last 168 hours. Lipid Profile: No results for input(s): CHOL, HDL, LDLCALC, TRIG, CHOLHDL, LDLDIRECT in the last 72 hours. Thyroid Function Tests: No results for input(s): TSH, T4TOTAL, FREET4, T3FREE, THYROIDAB in the last 72 hours. Anemia Panel: No results for input(s): VITAMINB12, FOLATE, FERRITIN, TIBC, IRON, RETICCTPCT in the last 72 hours. Sepsis Labs: No results for input(s):  PROCALCITON, LATICACIDVEN in the last 168 hours.  No results found for this or any previous visit (from the past 240 hour(s)).       Radiology Studies: Dg Chest 2 View  Result Date: 08/19/2016 CLINICAL DATA:  Cough and fever for 4 days. EXAM: CHEST  2 VIEW COMPARISON:  01/01/2013 FINDINGS: Two views study shows patchy bilateral airspace disease suggesting multifocal pneumonia. Cardiopericardial silhouette is upper limits of normal for size. No pleural effusion. The visualized bony structures of the thorax are intact. Telemetry leads overlie the chest. IMPRESSION: Patchy bilateral airspace disease suggests multifocal pneumonia. Electronically Signed   By: Misty Stanley M.D.   On: 08/19/2016 15:29        Scheduled Meds: . acetaminophen  650 mg Oral Once  . azithromycin  500 mg Oral Daily  . cefTRIAXone (ROCEPHIN)  IV  1 g Intravenous Q24H  . enoxaparin (LOVENOX) injection  40 mg Subcutaneous Q24H  . fluticasone  2 spray Each Nare Daily  . Influenza vac split quadrivalent PF  0.5 mL Intramuscular Tomorrow-1000  . ipratropium-albuterol  3 mL Nebulization TID  . mometasone-formoterol  2 puff Inhalation BID  . multivitamin with minerals  1 tablet Oral Daily  . omega-3 acid ethyl esters  1 g Oral BID  . oseltamivir  75 mg Oral BID  . pantoprazole  40 mg Oral Daily  . predniSONE  40 mg Oral QAC breakfast   Continuous Infusions:   LOS: 1 day    Time spent:33 minutes.     Hosie Poisson, MD [facility] Pager [phone]  If 7PM-7AM, please contact night-coverage [email or web address] Password [email or web address] 08/21/2016, 3:01 PM

## 2016-08-21 NOTE — Progress Notes (Signed)
PHARMACIST - PHYSICIAN COMMUNICATION DR:   Karleen Hampshire CONCERNING: Antibiotic IV to Oral Route Change Policy  RECOMMENDATION: This patient is receiving zithromax by the intravenous route.  Based on criteria approved by the Pharmacy and Therapeutics Committee, the antibiotic(s) is/are being converted to the equivalent oral dose form(s).   DESCRIPTION: These criteria include:  Patient being treated for a respiratory tract infection, urinary tract infection, cellulitis or clostridium difficile associated diarrhea if on metronidazole  The patient is not neutropenic and does not exhibit a GI malabsorption state  The patient is eating (either orally or via tube) and/or has been taking other orally administered medications for a least 24 hours  The patient is improving clinically and has a Tmax < 100.5  If you have questions about this conversion, please contact the Pharmacy Department  []   [phone] )  [facility] []   [phone] )  [facility] []   [phone] )  [facility] []   [phone] )  [facility] [x]   [phone] )  [place], PharmD, [place] Pager [phone] 08/21/2016 8:46 AM

## 2016-08-22 ENCOUNTER — Inpatient Hospital Stay (HOSPITAL_COMMUNITY): Payer: BLUE CROSS/BLUE SHIELD

## 2016-08-22 MED ORDER — PREDNISONE 20 MG PO TABS: 40.0000 mg | ORAL_TABLET | Freq: Every day | ORAL | 0 refills | Status: DC

## 2016-08-22 MED ORDER — AMOXICILLIN-POT CLAVULANATE 875-125 MG PO TABS: 1.0000 | ORAL_TABLET | Freq: Two times a day (BID) | ORAL | 0 refills | Status: DC

## 2016-08-22 MED ORDER — OSELTAMIVIR PHOSPHATE 75 MG PO CAPS: 75.0000 mg | ORAL_CAPSULE | Freq: Two times a day (BID) | ORAL | 0 refills | Status: DC

## 2016-08-22 MED ORDER — ALBUTEROL SULFATE (2.5 MG/3ML) 0.083% IN NEBU: 2.5000 mg | INHALATION_SOLUTION | RESPIRATORY_TRACT | 12 refills | Status: DC | PRN

## 2016-08-22 NOTE — Progress Notes (Signed)
Patient discharged home.  Leaving with personal belongings.  Reports understanding of discharge instructions.  Room air, denies pain, no s/s of distress.  Accompanied by brother.  No complaints.

## 2016-08-22 NOTE — Discharge Summary (Signed)
Physician Discharge Summary  ELIBERTO STEURER [id number] DOB: February 11, 1951 DOA: 08/19/2016  PCP: Kathlene November, MD  Admit date: 08/19/2016 Discharge date: 08/22/2016  Admitted From: Home Disposition: Home  Recommendations for Outpatient Follow-up:  1. Follow up with PCP in 1-2 weeks 2. Please obtain BMP/CBC in one week 3. Please obtain a CXR in 2 weeks to evaluate for resolution of the pneumonia.      Discharge Condition:stable.  CODE STATUS: full code.  Diet recommendation: Heart Healthy /  Brief/Interim Summary:Avi R Fulpis a 66 y.o.malewith medical history significant forpersistent asthma, allergic rhinitis, and hyperlipidemia who presents to the emergency department with productive cough, fevers, and malaise.he was found to be influenza A positive and  Have community acquired pneumonia.   Discharge Diagnoses:  Principal Problem:   CAP (community acquired pneumonia) Active Problems:   Asthma   Influenza A   GERD with esophagitis   Nausea & vomiting  Influenza A and CAP: tamiflu ordered, and  He was started on rocephin and zithromax Oxygen sats on RA are good.  Blood cultures are pending and negative.  Sputum cultures pending and negative so far.  urine fore strep antigen positive.  Repeat CXR shows improving pneumonia. Discharged on oral augmentin to complete the course.    Asthma,   resume bronchodilators, added prednisone.  Complete the course of steroids.    Nausea and vomiting  Resolved.   GERD: resume PPI. Marland Kitchen    Discharge Instructions  Discharge Instructions    Discharge instructions    Complete by:  As directed    Please follow up with PCP in 2 weeks, please obtain a CXR in 2 weeks to evaluate for resolution of pneumonia.     Allergies as of 08/22/2016   No Known Allergies     Medication List    STOP taking these medications   aspirin 81 MG chewable tablet     TAKE these medications   acetaminophen 500 MG tablet Commonly known as:   TYLENOL Take 1,000-1,500 mg by mouth every 3 (three) hours as needed for mild pain, moderate pain, fever or headache.   albuterol (2.5 MG/3ML) 0.083% nebulizer solution Commonly known as:  PROVENTIL Take 3 mLs (2.5 mg total) by nebulization every 4 (four) hours as needed for wheezing or shortness of breath.   amoxicillin-clavulanate 875-125 MG tablet Commonly known as:  AUGMENTIN Take 1 tablet by mouth 2 (two) times daily.   budesonide-formoterol 160-4.5 MCG/ACT inhaler Commonly known as:  SYMBICORT Inhale 2 puffs into the lungs 2 (two) times daily.   celecoxib 100 MG capsule Commonly known as:  CELEBREX Take 1 capsule (100 mg total) by mouth 2 (two) times daily as needed. What changed:  reasons to take this   dextromethorphan-guaiFENesin 30-600 MG 12hr tablet Commonly known as:  MUCINEX DM Take 1 tablet by mouth 2 (two) times daily as needed for cough.   esomeprazole 40 MG capsule Commonly known as:  NEXIUM Take 1 capsule (40 mg total) by mouth 2 (two) times daily before a meal. What changed:  when to take this  additional instructions   Fish Oil 1000 MG Caps Take 1 capsule by mouth 2 (two) times daily.   fluticasone 50 MCG/ACT nasal spray Commonly known as:  FLONASE Place 2 sprays into the nose daily.   levalbuterol 45 MCG/ACT inhaler Commonly known as:  XOPENEX HFA Inhale 1-2 puffs into the lungs daily as needed for wheezing or shortness of breath. What changed:  when to take this  multivitamin with minerals Tabs tablet Take 1 tablet by mouth daily.   oseltamivir 75 MG capsule Commonly known as:  TAMIFLU Take 1 capsule (75 mg total) by mouth 2 (two) times daily.   oseltamivir 75 MG capsule Commonly known as:  TAMIFLU Take 1 capsule (75 mg total) by mouth 2 (two) times daily.   predniSONE 20 MG tablet Commonly known as:  DELTASONE Take 2 tablets (40 mg total) by mouth daily before breakfast. Start taking on:  08/23/2016      [place], MD Follow up in 3 day(s).   Specialty:  Internal Medicine Contact information: [employer] [address] [place] [address] [phone]          No Known Allergies  Consultations:  none   Procedures/Studies: Dg Chest 2 View  Result Date: 08/22/2016 CLINICAL DATA:  Cough. EXAM: CHEST  2 VIEW COMPARISON:  August 09, 2016 FINDINGS: No pneumothorax. The bilateral infiltrates persist but have significantly improved. Minimal opacity remains. No other changes. IMPRESSION: Significant interval improvement. Recommend follow-up to complete resolution. Electronically Signed   By: Dorise Bullion III M.D   On: 08/22/2016 11:53   Dg Chest 2 View  Result Date: 08/19/2016 CLINICAL DATA:  Cough and fever for 4 days. EXAM: CHEST  2 VIEW COMPARISON:  01/01/2013 FINDINGS: Two views study shows patchy bilateral airspace disease suggesting multifocal pneumonia. Cardiopericardial silhouette is upper limits of normal for size. No pleural effusion. The visualized bony structures of the thorax are intact. Telemetry leads overlie the chest. IMPRESSION: Patchy bilateral airspace disease suggests multifocal pneumonia. Electronically Signed   By: Misty Stanley M.D.   On: 08/19/2016 15:29       Subjective: Feeling better ready to go home.   Discharge Exam: Vitals:   08/22/16 0548 08/22/16 0838  BP: (!) 148/80   Pulse: 68 75  Resp: 18 18  Temp: 98.9 F (37.2 C)    Vitals:   08/21/16 1615 08/21/16 2052 08/22/16 0548 08/22/16 0838  BP:  (!) 173/84 (!) 148/80   Pulse:  65 68 75  Resp:  18 18 18   Temp:  99.4 F (37.4 C) 98.9 F (37.2 C)   TempSrc:  Oral Oral   SpO2: 96% 97% 96% 97%  Weight:      Height:        General: Pt is alert, awake, not in acute distress Cardiovascular: RRR, S1/S2 +, no rubs, no gallops Respiratory: CTA bilaterally, no wheezing, no rhonchi Abdominal: Soft, NT, ND, bowel sounds + Extremities: no edema, no cyanosis    The results of significant  diagnostics from this hospitalization (including imaging, microbiology, ancillary and laboratory) are listed below for reference.     Microbiology: No results found for this or any previous visit (from the past 240 hour(s)).   Labs: BNP (last 3 results) No results for input(s): BNP in the last 8760 hours. Basic Metabolic Panel:  Recent Labs Lab 08/19/16 1448 08/21/16 1020  NA 134* 139  K 3.5 3.5  CL 104 106  CO2 22 27  GLUCOSE 120* 142*  BUN 19 15  CREATININE 1.20 0.98  CALCIUM 8.5* 8.6*   Liver Function Tests: No results for input(s): AST, ALT, ALKPHOS, BILITOT, PROT, ALBUMIN in the last 168 hours. No results for input(s): LIPASE, AMYLASE in the last 168 hours. No results for input(s): AMMONIA in the last 168 hours. CBC:  Recent Labs Lab 08/19/16 1448 08/21/16 1020  WBC 13.6* 8.9  NEUTROABS 11.4* 6.0  HGB 13.2 12.0*  HCT 37.0* 33.4*  MCV 91.4 92.0  PLT 197 251   Cardiac Enzymes: No results for input(s): CKTOTAL, CKMB, CKMBINDEX, TROPONINI in the last 168 hours. BNP: Invalid input(s): POCBNP CBG: No results for input(s): GLUCAP in the last 168 hours. D-Dimer No results for input(s): DDIMER in the last 72 hours. Hgb A1c No results for input(s): HGBA1C in the last 72 hours. Lipid Profile No results for input(s): CHOL, HDL, LDLCALC, TRIG, CHOLHDL, LDLDIRECT in the last 72 hours. Thyroid function studies No results for input(s): TSH, T4TOTAL, T3FREE, THYROIDAB in the last 72 hours.  Invalid input(s): FREET3 Anemia work up No results for input(s): VITAMINB12, FOLATE, FERRITIN, TIBC, IRON, RETICCTPCT in the last 72 hours. Urinalysis    Component Value Date/Time   COLORURINE YELLOW 12/19/2008 0733   APPEARANCEUR CLEAR 12/19/2008 0733   LABSPEC >=1.030 12/19/2008 0733   PHURINE 5.5 12/19/2008 0733   GLUCOSEU NEGATIVE 12/19/2008 0733   BILIRUBINUR NEGATIVE 12/19/2008 0733   KETONESUR TRACE 12/19/2008 0733   UROBILINOGEN 0.2 12/19/2008 0733   NITRITE  NEGATIVE 12/19/2008 0733   LEUKOCYTESUR NEGATIVE 12/19/2008 0733   Sepsis Labs Invalid input(s): PROCALCITONIN,  WBC,  LACTICIDVEN Microbiology No results found for this or any previous visit (from the past 240 hour(s)).   Time coordinating discharge: Over 30 minutes  SIGNED:   Hosie Poisson, MD  [facility] 08/22/2016, 1:41 PM Pager   If 7PM-7AM, please contact night-coverage [email or web address] Password [email or web address]

## 2016-08-24 ENCOUNTER — Telehealth: Payer: Self-pay | Admitting: Behavioral Health

## 2016-08-24 NOTE — Telephone Encounter (Signed)
Patient voiced that he was informed to make an appointment with PCP in the next 2-3 days post discharge from hospital. Follow-up visit scheduled for 08/25/16 at 11:30 AM.

## 2016-08-25 ENCOUNTER — Encounter: Payer: Self-pay | Admitting: Internal Medicine

## 2016-08-25 ENCOUNTER — Ambulatory Visit (INDEPENDENT_AMBULATORY_CARE_PROVIDER_SITE_OTHER): Payer: BLUE CROSS/BLUE SHIELD | Admitting: Internal Medicine

## 2016-08-25 VITALS — BP 132/84 | HR 89 | Temp 98.4°F | Resp 14 | Ht 70.0 in | Wt 186.2 lb

## 2016-08-25 DIAGNOSIS — J101 Influenza due to other identified influenza virus with other respiratory manifestations: Secondary | ICD-10-CM | POA: Diagnosis not present

## 2016-08-25 DIAGNOSIS — J189 Pneumonia, unspecified organism: Secondary | ICD-10-CM | POA: Diagnosis not present

## 2016-08-25 NOTE — Progress Notes (Signed)
Pre visit review using our clinic review tool, if applicable. No additional management support is needed unless otherwise documented below in the visit note. 

## 2016-08-25 NOTE — Assessment & Plan Note (Signed)
Influenza, community acquired pneumonia: Since the last office visit he got very sick, went to the ER, was admitted and dx w/ influenza and community acquire pneumonia. He is now on Augmentin and prednisone, finished Tamiflu. Definitely improving. He is  not happy about the fact that  he ended up being very sick after our last encounter, we had a respectful and long conversation about the visit and my recommendations, he   believes I "missed something", I think he simply got worse;  if so desired he can switch to another primary doctor in this practice. Otherwise, recommend to check a chest x-ray and CBC in 3 weeks, call if they improving does not continue, check up in 3 months.

## 2016-08-25 NOTE — Patient Instructions (Signed)
Please continue taking the antibiotics and prednisone until you finish. Continue your routine medications as usual   Come back in 3 weeks for blood work (CBC) and a chest x-ray at the first floor. You don't need to see me that day   Please make an appointment for: Blood work in 2 weeks a checkup in 3 months  If you don't gradually continue to improve please call us anytime.

## 2016-08-25 NOTE — Progress Notes (Signed)
Subjective:    Patient ID: Brett Berry, male    DOB: June 08, 1951, 66 y.o.   MRN: [id number]  DOS:  08/25/2016 Type of visit - description : hospital f/ulow-up Interval history: Hospital follow-up. Patient was seen here 08/17/2016, 2 days later presented to the ER feeling worse with cough, fever and malaise. He was found to be influenza A + and had a community-acquired pneumonia. Was treated with Tamiflu, Zithromax and Rocephin. He improved and was discharged on oral Augmentin. BMP okay, CBC show a white count of 13.6, later on WBC decreased to 8.9. Hemoglobin was 12, slightly below his normal Pneumococcal antigen in the urine was positive. I don't see blood or sputum cultures  Chest x-ray multifocal pneumonia, follow-up chest x-ray improved.  Review of Systems Since he left the hospital, he finished Tamiflu, still has some prednisone and Augmentin. He feels "50% better". Asthma has improved, cough and sputum production significantly decreased Denies any nausea, vomiting or diarrhea. No fever or chills Appetite back to normal, actually above normal he thinks due to prednisone  Past Medical History:  Diagnosis Date  . Allergic rhinitis   . Asthma   . Bilateral knee pain 2016   x-ray showed medial compartment arthritis, worse on right than left  . Erectile dysfunction 07/30/2014  . Hearing difficulty    has aids  . Hyperlipidemia    intolerant to zocor  . Sinusitis, chronic     Past Surgical History:  Procedure Laterality Date  . CATARACT EXTRACTION Right 2016  . MASTOIDECTOMY     right   . right hand surg     for infection in '07  . SINUS ENDO WITH FUSION      Social History   Social History  . Marital status: Single    Spouse name: N/A  . Number of children: 1  . 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 Smoking status: Former Smoker    Quit date: 09/08/1973  . Smokeless  tobacco: Never Used  . Alcohol use 4.8 oz/week    8 Cans of beer per week     Comment: 2-3 times week  . Drug use: No  . Sexual activity: Yes    Partners: Female   Other Topics Concern  . Not on file   Social History Narrative   HSG. Married '76- 6 years divorced ; '85. 1 son- '86.    Lives w/ second wife of 57 years   Lost wife 12-2014      Allergies as of 08/25/2016   No Known Allergies     Medication List       Accurate as of 08/25/16  6:11 PM. Always use your most recent med list.          acetaminophen 500 MG tablet Commonly known as:  TYLENOL Take 1,000-1,500 mg by mouth every 3 (three) hours as needed for mild pain, moderate pain, fever or headache.   albuterol (2.5 MG/3ML) 0.083% nebulizer solution Commonly known as:  PROVENTIL Take 3 mLs (2.5 mg total) by nebulization every 4 (four) hours as needed for wheezing or shortness of breath.   amoxicillin-clavulanate 875-125 MG tablet Commonly known as:  AUGMENTIN Take 1 tablet by mouth 2 (two) times daily.   budesonide-formoterol 160-4.5 MCG/ACT inhaler Commonly known as:  SYMBICORT Inhale 2 puffs into the lungs 2 (two) times daily.   celecoxib 100 MG capsule Commonly  known as:  CELEBREX Take 1 capsule (100 mg total) by mouth 2 (two) times daily as needed.   dextromethorphan-guaiFENesin 30-600 MG 12hr tablet Commonly known as:  MUCINEX DM Take 1 tablet by mouth 2 (two) times daily as needed for cough.   esomeprazole 40 MG capsule Commonly known as:  NEXIUM Take 1 capsule (40 mg total) by mouth 2 (two) times daily before a meal.   Fish Oil 1000 MG Caps Take 1 capsule by mouth 2 (two) times daily.   fluticasone 50 MCG/ACT nasal spray Commonly known as:  FLONASE Place 2 sprays into the nose daily.   levalbuterol 45 MCG/ACT inhaler Commonly known as:  XOPENEX HFA Inhale 1-2 puffs into the lungs daily as needed for wheezing or shortness of breath.   multivitamin with minerals Tabs tablet Take 1 tablet  by mouth daily.   predniSONE 20 MG tablet Commonly known as:  DELTASONE Take 2 tablets (40 mg total) by mouth daily before breakfast.          Objective:   Physical Exam BP 132/84 (BP Location: Left Arm, Patient Position: Sitting, Cuff Size: Normal)   Pulse 89   Temp 98.4 F (36.9 C) (Oral)   Resp 14   Ht 5\' 10"  (1.778 m)   Wt 186 lb 4 oz (84.5 kg)   SpO2 97%   BMI 26.72 kg/m  General:   Well developed, well nourished . NAD.  HEENT:  Normocephalic . Face symmetric, atraumatic Lungs:  CTA B Normal respiratory effort, no intercostal retractions, no accessory muscle use. Heart: RRR,  no murmur.  No pretibial edema bilaterally  Skin: Not pale. Not jaundice Neurologic:  alert & oriented X3.  Speech normal, gait appropriate for age and unassisted Psych--  Cognition and judgment appear intact.  Cooperative with normal attention span and concentration.  Behavior appropriate. No anxious or depressed appearing.      Assessment & Plan:    Assessment Hyperlipidemia ---intolerant to simvastatin Asthma GERD E.D. Chronic sinusitis, Rhinitis (sees ENT Dr Lucia Gaskins) DJD- knees R>>L, wrists  , Dr Sharol Given [facility] has aids  PLAN: Influenza, community acquired pneumonia: Since the last office visit he got very sick, went to the ER, was admitted and dx w/ influenza and community acquire pneumonia. He is now on Augmentin and prednisone, finished Tamiflu. Definitely improving. He is  not happy about the fact that  he ended up being very sick after our last encounter, we had a respectful and long conversation about the visit and my recommendations, he   believes I "missed something", I think he simply got worse;  if so desired he can switch to another primary doctor in this practice. Otherwise, recommend to check a chest x-ray and CBC in 3 weeks, call if they improving does not continue, check up in 3 months.

## 2016-09-15 ENCOUNTER — Ambulatory Visit (HOSPITAL_BASED_OUTPATIENT_CLINIC_OR_DEPARTMENT_OTHER)
Admission: RE | Admit: 2016-09-15 | Discharge: 2016-09-15 | Disposition: A | Discharge status: Home / Self Care | Payer: BLUE CROSS/BLUE SHIELD | Source: Ambulatory Visit | Attending: Internal Medicine | Admitting: Internal Medicine

## 2016-09-15 DIAGNOSIS — J189 Pneumonia, unspecified organism: Secondary | ICD-10-CM | POA: Insufficient documentation

## 2017-02-17 ENCOUNTER — Encounter: Payer: BLUE CROSS/BLUE SHIELD | Admitting: Internal Medicine

## 2017-02-18 ENCOUNTER — Encounter: Payer: Self-pay | Admitting: Internal Medicine

## 2017-02-18 ENCOUNTER — Ambulatory Visit (INDEPENDENT_AMBULATORY_CARE_PROVIDER_SITE_OTHER): Payer: BLUE CROSS/BLUE SHIELD | Admitting: Internal Medicine

## 2017-02-18 VITALS — BP 120/75 | HR 50 | Temp 97.6°F | Ht 70.0 in | Wt 174.4 lb

## 2017-02-18 DIAGNOSIS — Z0000 Encounter for general adult medical examination without abnormal findings: Secondary | ICD-10-CM | POA: Diagnosis not present

## 2017-02-18 DIAGNOSIS — Z1159 Encounter for screening for other viral diseases: Secondary | ICD-10-CM

## 2017-02-18 LAB — COMPREHENSIVE METABOLIC PANEL
ALBUMIN: 3.9 g/dL (ref 3.5–5.2)
ALK PHOS: 51 U/L (ref 39–117)
ALT: 14 U/L (ref 0–53)
AST: 17 U/L (ref 0–37)
BUN: 16 mg/dL (ref 6–23)
CALCIUM: 9.1 mg/dL (ref 8.4–10.5)
CO2: 29 mEq/L (ref 19–32)
CREATININE: 1.09 mg/dL (ref 0.40–1.50)
Chloride: 105 mEq/L (ref 96–112)
GFR: 72.33 mL/min (ref >60.00)
Glucose, Bld: 104 mg/dL — ABNORMAL HIGH (ref 70–99)
POTASSIUM: 4.2 meq/L (ref 3.5–5.1)
SODIUM: 140 meq/L (ref 135–145)
TOTAL PROTEIN: 6.3 g/dL (ref 6.0–8.3)
Total Bilirubin: 1.1 mg/dL (ref 0.2–1.2)

## 2017-02-18 LAB — CBC WITH DIFFERENTIAL/PLATELET
Basophils Absolute: 0.1 10*3/uL (ref 0.0–0.1)
Basophils Relative: 0.8 % (ref 0.0–3.0)
EOS ABS: 0.8 10*3/uL — AB (ref 0.0–0.7)
Eosinophils Relative: 10.6 % — ABNORMAL HIGH (ref 0.0–5.0)
HEMATOCRIT: 38.7 % — AB (ref 39.0–52.0)
HEMOGLOBIN: 13.3 g/dL (ref 13.0–17.0)
LYMPHS PCT: 34.3 % (ref 12.0–46.0)
Lymphs Abs: 2.5 10*3/uL (ref 0.7–4.0)
MCHC: 34.4 g/dL (ref 30.0–36.0)
MCV: 97.8 fl (ref 78.0–100.0)
MONO ABS: 0.5 10*3/uL (ref 0.1–1.0)
Monocytes Relative: 7.4 % (ref 3.0–12.0)
Neutro Abs: 3.4 10*3/uL (ref 1.4–7.7)
Neutrophils Relative %: 46.9 % (ref 43.0–77.0)
Platelets: 270 10*3/uL (ref 150.0–400.0)
RBC: 3.96 Mil/uL — AB (ref 4.22–5.81)
RDW: 13.9 % (ref 11.5–15.5)
WBC: 7.3 10*3/uL (ref 4.0–10.5)

## 2017-02-18 LAB — LIPID PANEL
CHOL/HDL RATIO: 4
CHOLESTEROL: 193 mg/dL (ref 0–200)
HDL: 49.2 mg/dL (ref >39.00)
LDL CALC: 128 mg/dL — AB (ref 0–99)
NonHDL: 143.85
TRIGLYCERIDES: 80 mg/dL (ref 0.0–149.0)
VLDL: 16 mg/dL (ref 0.0–40.0)

## 2017-02-18 LAB — HEPATITIS C ANTIBODY: HCV Ab: NEGATIVE

## 2017-02-18 LAB — TSH: TSH: 3.29 u[IU]/mL (ref 0.35–4.50)

## 2017-02-18 NOTE — Assessment & Plan Note (Signed)
BP elevated upon arrival to the office, ambulatory BPs always normal, I recheck his BP: 125/70. Hyperlipidemia: Diet control, check labs Asthma: On Symbicort, rarely uses rescue inhalers DJD: Takes Celebrex as sporadically. Ear discharge: Exam normal, if symptoms persist recommend ENT. RTC one year

## 2017-02-18 NOTE — Progress Notes (Signed)
Subjective:    Patient ID: Brett Berry, male    DOB: 11-Dec-1950, 66 y.o.   MRN: [id number]  DOS:  02/18/2017 Type of visit - description : cpx Interval history: No major concerns   Review of Systems From time to time he sees ear discharge, associated with itching, not hurting. He wears hearing aids.  Other than above, a 14 point review of systems is negative     Past Medical History:  Diagnosis Date  . Allergic rhinitis   . Asthma   . Bilateral knee pain 2016   x-ray showed medial compartment arthritis, worse on right than left  . Erectile dysfunction 07/30/2014  . Hearing difficulty    has aids  . Hyperlipidemia    intolerant to zocor  . Sinusitis, chronic     Past Surgical History:  Procedure Laterality Date  . CATARACT EXTRACTION Right 2016  . MASTOIDECTOMY     right   . right hand surg     for infection in '07  . SINUS ENDO WITH FUSION      Social History   Social History  . Marital status: Single    Spouse name: N/A  . Number of children: 1  . 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 Smoking status: Former Smoker    Quit date: 09/08/1973  . Smokeless tobacco: Never Used  . Alcohol use 4.8 oz/week    8 Cans of beer per week     Comment: 2-3 times week  . Drug use: No  . Sexual activity: Yes    Partners: Female   Other Topics Concern  . Not on file   Social History Narrative   HSG. Married '76- 6 years divorced ; '85. 1 son- '86.    Lost wife 12-2014     Family History  Problem Relation Age of Onset  . COPD Mother   . Breast cancer Mother   . Heart disease Mother        age early 30s  . Alzheimer's disease Father   . Lung cancer Father        smoker  . Prostate cancer Father        died age 42  . Heart disease Father        age 48  . Colon cancer Neg Hx      Allergies as of 02/18/2017   No Known Allergies     Medication List         Accurate as of 02/18/17  5:50 PM. Always use your most recent med list.          acetaminophen 500 MG tablet Commonly known as:  TYLENOL Take 1,000-1,500 mg by mouth every 3 (three) hours as needed for mild pain, moderate pain, fever or headache.   albuterol (2.5 MG/3ML) 0.083% nebulizer solution Commonly known as:  PROVENTIL Take 3 mLs (2.5 mg total) by nebulization every 4 (four) hours as needed for wheezing or shortness of breath.   budesonide-formoterol 160-4.5 MCG/ACT inhaler Commonly known as:  SYMBICORT Inhale 2 puffs into the lungs 2 (two) times daily.   celecoxib 100 MG capsule Commonly known as:  CELEBREX Take 1 capsule (100 mg total) by mouth 2 (two) times daily as needed.   esomeprazole 40 MG capsule Commonly known as:  NEXIUM Take 1 capsule (40 mg total) by mouth 2 (two) times daily before a meal.  Fish Oil 1000 MG Caps Take 1 capsule by mouth 2 (two) times daily.   fluticasone 50 MCG/ACT nasal spray Commonly known as:  FLONASE Place 2 sprays into the nose daily.   levalbuterol 45 MCG/ACT inhaler Commonly known as:  XOPENEX HFA Inhale 1-2 puffs into the lungs daily as needed for wheezing or shortness of breath.   multivitamin with minerals Tabs tablet Take 1 tablet by mouth daily.          Objective:   Physical Exam BP 120/75 (BP Location: Left Arm, Patient Position: Sitting, Cuff Size: Small)   Pulse (!) 50   Temp 97.6 F (36.4 C) (Oral)   Ht 5\' 10"  (1.778 m)   Wt 174 lb 6 oz (79.1 kg)   SpO2 100%   BMI 25.02 kg/m   General:   Well developed, well nourished . NAD.  Neck: No  thyromegaly  HEENT:  Normocephalic . Face symmetric, atraumatic. TMs without redness. Canals look healthy with no rash or discharge. Lungs:  CTA B Normal respiratory effort, no intercostal retractions, no accessory muscle use. Heart: RRR,  no murmur.  No pretibial edema bilaterally  Abdomen:  Not distended, soft, non-tender. No rebound or rigidity.   Skin: Exposed  areas without rash. Not pale. Not jaundice Neurologic:  alert & oriented X3.  Speech normal, gait appropriate for age and unassisted Strength symmetric and appropriate for age.  Psych: Cognition and judgment appear intact.  Cooperative with normal attention span and concentration.  Behavior appropriate. No anxious or depressed appearing.    Assessment & Plan:   Assessment Hyperlipidemia ---intolerant to simvastatin Asthma GERD E.D. Chronic sinusitis, Rhinitis (sees ENT Dr Lucia Gaskins) DJD- knees R>>L, wrists  , Dr Sharol Given [facility] has aids  PLAN: BP elevated upon arrival to the office, ambulatory BPs always normal, I recheck his BP: 125/70. Hyperlipidemia: Diet control, check labs Asthma: On Symbicort, rarely uses rescue inhalers DJD: Takes Celebrex as sporadically. Ear discharge: Exam normal, if symptoms persist recommend ENT. RTC one year

## 2017-02-18 NOTE — Patient Instructions (Signed)
GO TO THE LAB : Get the blood work     GO TO THE FRONT DESK Schedule your next appointment for a  physical exam in one year    Check the  blood pressure monthly   Be sure your blood pressure is between 110/65 and  145/85. If it is consistently higher or lower, let me know

## 2017-02-18 NOTE — Progress Notes (Signed)
Pre visit review using our clinic review tool, if applicable. No additional management support is needed unless otherwise documented below in the visit note. 

## 2017-02-18 NOTE — Assessment & Plan Note (Signed)
-  Td 2013; PNM shot 2013; prevnar 2015; Shingles shot 01-2015; shingrex discussed, will wait  -CCS- 09/25/11-adenomatous polyps and diverticulosis; due for a cscope, GI letter reprinted  -DRE-PSA wnl 2017 Labs: CMP, FLP, CBC, TSH, hep C -Diet -exercise discussed

## 2017-03-06 ENCOUNTER — Other Ambulatory Visit: Payer: Self-pay | Admitting: Internal Medicine

## 2017-03-06 DIAGNOSIS — J310 Chronic rhinitis: Secondary | ICD-10-CM

## 2017-09-23 ENCOUNTER — Other Ambulatory Visit: Payer: Self-pay | Admitting: Internal Medicine

## 2017-12-02 DIAGNOSIS — Z961 Presence of intraocular lens: Secondary | ICD-10-CM | POA: Diagnosis not present

## 2017-12-02 DIAGNOSIS — H40013 Open angle with borderline findings, low risk, bilateral: Secondary | ICD-10-CM | POA: Diagnosis not present

## 2017-12-29 ENCOUNTER — Ambulatory Visit (INDEPENDENT_AMBULATORY_CARE_PROVIDER_SITE_OTHER): Payer: Medicare Other | Admitting: Orthopedic Surgery

## 2017-12-29 ENCOUNTER — Encounter (INDEPENDENT_AMBULATORY_CARE_PROVIDER_SITE_OTHER): Payer: Self-pay | Admitting: Orthopedic Surgery

## 2017-12-29 ENCOUNTER — Ambulatory Visit (INDEPENDENT_AMBULATORY_CARE_PROVIDER_SITE_OTHER): Payer: Self-pay

## 2017-12-29 ENCOUNTER — Other Ambulatory Visit: Payer: Self-pay | Admitting: Internal Medicine

## 2017-12-29 DIAGNOSIS — M25561 Pain in right knee: Secondary | ICD-10-CM

## 2017-12-29 DIAGNOSIS — M25572 Pain in left ankle and joints of left foot: Secondary | ICD-10-CM

## 2017-12-29 DIAGNOSIS — G8929 Other chronic pain: Secondary | ICD-10-CM

## 2018-01-02 NOTE — Progress Notes (Signed)
Office Visit Note   Patient: Brett Berry           Date of Birth: [birth year]           MRN: [id number] Visit Date: 12/29/2017 Requested by: Colon Branch, [address] [place], [place] [address] PCP: Colon Branch, MD  Subjective: Chief Complaint  Patient presents with  . Right Knee - Pain    HPI: Brett Berry is a patient with right knee pain.  Last seen he did get good relief for a year after that.  Now he reports relatively severe pain in the right knee for the past 6 months.  He describes weakness and swelling.  Pain is worse with activity.  He also describes a knot on the left MTP joint which is interfering with shoe wear.  Describes pain on the medial aspect of the right knee only.  He manages a Educational psychologist.              ROS: All systems reviewed are negative as they relate to the chief complaint within the history of present illness.  Patient denies  fevers or chills.   Assessment & Plan: Visit Diagnoses:  1. Chronic pain of right knee   2. Pain in left ankle and joints of left foot     Plan: Impression is severe medial compartment right knee arthritis with significant varus malalignment.  Approved for Euflexxa injections again.  In regards to this left foot dorsal mass I would recommend excision of the mass.  Particular painful in that but mass-effect of the cyst is giving him difficulty.  He states that he is tried aspirated several times.  No definite.  No evidence of infection at this time.  I will see him back several days after surgery.  Follow-Up Instructions: No follow-ups on file.   Orders:  Orders Placed This Encounter  Procedures  . XR KNEE 3 VIEW RIGHT  . XR Foot Complete Left   No orders of the defined types were placed in this encounter.     Procedures: No procedures performed   Clinical Data: No additional findings.  Objective: Vital Signs: There were no vitals taken for this visit.  Physical Exam:   Constitutional: Patient  appears well-developed HEENT:  Head: Normocephalic Eyes:EOM are normal Neck: Normal range of motion Cardiovascular: Normal rate Pulmonary/chest: Effort normal Neurologic: Patient is alert Skin: Skin is warm Psychiatric: Patient has normal mood and affect    Ortho Exam: Pubic exam demonstrates full active and passive range of motion of the left knee.  On the right knee there is medial joint line tenderness but reasonable range of motion.  Lateral and cruciates are stable.  Varus alignment is present.  On the left toe there is diminished MTP range of motion but a 2 x 2 centimeter dorsal cyst which is present.  There is no redness or erythema the base of the cyst it does appear to be fluid-filled..  Sensation to the toes intact.  Specialty Comments:  No specialty comments available.  Imaging: No results found.   PMFS History: Patient Active Problem List   Diagnosis Date Noted  . Influenza A 08/19/2016  . CAP (community acquired pneumonia) 08/19/2016  . GERD with esophagitis 08/19/2016  . Nausea & vomiting 08/19/2016  . PCP NOTES >>>>>>>>>>>>>>>>>> 02/18/2016  . DJD (knees, celebrex, ortho Dr Sharol Given) 01/08/2015  . Erectile dysfunction 07/30/2014  . Annual physical exam 12/29/2013  . Non-functioning tympanostomy tube 09/10/2011  .  Dysphagia 09/10/2011  . OTHER CHRONIC SINUSITIS 02/05/2010  . HEADACHE 02/05/2010  . Anxiety -insomnia 01/04/2009  . Hyperlipidemia 01/04/2009  . ALLERGIC RHINITIS 10/27/2007  . Asthma 10/27/2007   Past Medical History:  Diagnosis Date  . Allergic rhinitis   . Asthma   . Bilateral knee pain 2016   x-ray showed medial compartment arthritis, worse on right than left  . Erectile dysfunction 07/30/2014  . Hearing difficulty    has aids  . Hyperlipidemia    intolerant to zocor  . Sinusitis, chronic     Family History  Problem Relation Age of Onset  . COPD Mother   . Breast cancer Mother   . Heart disease Mother        age early 3s  .  Alzheimer's disease Father   . Lung cancer Father        smoker  . Prostate cancer Father        died age 31  . Heart disease Father        age 57  . Colon cancer Neg Hx     Past Surgical History:  Procedure Laterality Date  . CATARACT EXTRACTION Right 2016  . MASTOIDECTOMY     right   . right hand surg     for infection in '07  . SINUS ENDO WITH FUSION     Social History   Occupational History  . Occupation: Environmental education officer: [employer]  Tobacco Use  . Smoking status: Former Smoker    Last attempt to quit: 09/08/1973    Years since quitting: 44.3  . Smokeless tobacco: Never Used  Substance and Sexual Activity  . Alcohol use: Yes    Alcohol/week: 4.8 oz    Types: 8 Cans of beer per week    Comment: 2-3 times week  . Drug use: No  . Sexual activity: Yes    Partners: Female

## 2018-01-03 ENCOUNTER — Telehealth (INDEPENDENT_AMBULATORY_CARE_PROVIDER_SITE_OTHER): Payer: Self-pay

## 2018-01-03 NOTE — Telephone Encounter (Signed)
Submitted application online for Monovisc injection, bilateral knee due to Euflexxa not being a preferred product through [facility] Medicare.

## 2018-01-10 ENCOUNTER — Other Ambulatory Visit: Payer: Self-pay

## 2018-01-10 DIAGNOSIS — G8918 Other acute postprocedural pain: Secondary | ICD-10-CM | POA: Diagnosis not present

## 2018-01-10 DIAGNOSIS — M25775 Osteophyte, left foot: Secondary | ICD-10-CM | POA: Diagnosis not present

## 2018-01-10 DIAGNOSIS — M898X7 Other specified disorders of bone, ankle and foot: Secondary | ICD-10-CM | POA: Diagnosis not present

## 2018-01-10 DIAGNOSIS — M71372 Other bursal cyst, left ankle and foot: Secondary | ICD-10-CM | POA: Diagnosis not present

## 2018-01-10 DIAGNOSIS — Z0189 Encounter for other specified special examinations: Secondary | ICD-10-CM | POA: Diagnosis not present

## 2018-01-19 ENCOUNTER — Encounter (INDEPENDENT_AMBULATORY_CARE_PROVIDER_SITE_OTHER): Payer: Self-pay | Admitting: Orthopedic Surgery

## 2018-01-19 ENCOUNTER — Ambulatory Visit (INDEPENDENT_AMBULATORY_CARE_PROVIDER_SITE_OTHER): Payer: Medicare Other | Admitting: Orthopedic Surgery

## 2018-01-19 DIAGNOSIS — M25572 Pain in left ankle and joints of left foot: Secondary | ICD-10-CM

## 2018-01-19 NOTE — Progress Notes (Signed)
Post-Op Visit Note   Patient: Brett Berry           Date of Birth: 1951-05-21           MRN: [id number] Visit Date: 01/19/2018 PCP: Colon Branch, MD   Assessment & Plan:  Chief Complaint:  Chief Complaint  Patient presents with  . Left Foot - Routine Post Op   Visit Diagnoses:  1. Pain in left ankle and joints of left foot     Plan: Demetres is a patient who is now about 8 days out left great toe osteophyte removal and cheilectomy and cyst removal.  He is been doing well.  On exam the incision is intact.  Does have a little bit of numbness on the medial aspect of the toe.  That sensory nerve was over and around that cyst.  We did have to retract it in order to do the cheilectomy.  Plan at this time is to remove the sutures.  Steri-Strip the incision.  Okay for toe range of motion.  Follow-up in 4 weeks for final check.  He is not taking any pain medicine.  Follow-Up Instructions: Return in about 1 month (around 02/16/2018).   Orders:  No orders of the defined types were placed in this encounter.  No orders of the defined types were placed in this encounter.   Imaging: No results found.  PMFS History: Patient Active Problem List   Diagnosis Date Noted  . Influenza A 08/19/2016  . CAP (community acquired pneumonia) 08/19/2016  . GERD with esophagitis 08/19/2016  . Nausea & vomiting 08/19/2016  . PCP NOTES >>>>>>>>>>>>>>>>>> 02/18/2016  . DJD (knees, celebrex, ortho Dr Sharol Given) 01/08/2015  . Erectile dysfunction 07/30/2014  . Annual physical exam 12/29/2013  . Non-functioning tympanostomy tube 09/10/2011  . Dysphagia 09/10/2011  . OTHER CHRONIC SINUSITIS 02/05/2010  . HEADACHE 02/05/2010  . Anxiety -insomnia 01/04/2009  . Hyperlipidemia 01/04/2009  . ALLERGIC RHINITIS 10/27/2007  . Asthma 10/27/2007   Past Medical History:  Diagnosis Date  . Allergic rhinitis   . Asthma   . Bilateral knee pain 2016   x-ray showed medial compartment arthritis, worse on right than  left  . Erectile dysfunction 07/30/2014  . Hearing difficulty    has aids  . Hyperlipidemia    intolerant to zocor  . Sinusitis, chronic     Family History  Problem Relation Age of Onset  . COPD Mother   . Breast cancer Mother   . Heart disease Mother        age early 53s  . Alzheimer's disease Father   . Lung cancer Father        smoker  . Prostate cancer Father        died age 58  . Heart disease Father        age 56  . Colon cancer Neg Hx     Past Surgical History:  Procedure Laterality Date  . CATARACT EXTRACTION Right 2016  . MASTOIDECTOMY     right   . right hand surg     for infection in '07  . SINUS ENDO WITH FUSION     Social History   Occupational History  . Occupation: Environmental education officer: [employer]  Tobacco Use  . Smoking status: Former Smoker    Last attempt to quit: 09/08/1973    Years since quitting: 44.3  . Smokeless tobacco: Never Used  Substance and Sexual Activity  .  Alcohol use: Yes    Alcohol/week: 4.8 oz    Types: 8 Cans of beer per week    Comment: 2-3 times week  . Drug use: No  . Sexual activity: Yes    Partners: Female

## 2018-01-20 ENCOUNTER — Telehealth (INDEPENDENT_AMBULATORY_CARE_PROVIDER_SITE_OTHER): Payer: Self-pay

## 2018-01-20 NOTE — Telephone Encounter (Signed)
Talked with Binnie Rail. With [facility] and initiated PA. Pending Review Authorization# [id number]

## 2018-02-02 ENCOUNTER — Telehealth (INDEPENDENT_AMBULATORY_CARE_PROVIDER_SITE_OTHER): Payer: Self-pay

## 2018-02-02 NOTE — Telephone Encounter (Signed)
Submitted application online for SynviscOne, bilateral knee. 

## 2018-02-08 ENCOUNTER — Telehealth (INDEPENDENT_AMBULATORY_CARE_PROVIDER_SITE_OTHER): Payer: Self-pay

## 2018-02-08 NOTE — Telephone Encounter (Signed)
Patient approved for SynviscOne, bilateral knee. [place] Patient will be respnsible for 20% OOP. Co-pay of $50.00 No PA required.

## 2018-02-18 ENCOUNTER — Encounter (INDEPENDENT_AMBULATORY_CARE_PROVIDER_SITE_OTHER): Payer: Self-pay | Admitting: Orthopedic Surgery

## 2018-02-18 ENCOUNTER — Ambulatory Visit (INDEPENDENT_AMBULATORY_CARE_PROVIDER_SITE_OTHER): Payer: Medicare Other | Admitting: Orthopedic Surgery

## 2018-02-18 ENCOUNTER — Telehealth (INDEPENDENT_AMBULATORY_CARE_PROVIDER_SITE_OTHER): Payer: Self-pay

## 2018-02-18 DIAGNOSIS — M1711 Unilateral primary osteoarthritis, right knee: Secondary | ICD-10-CM | POA: Diagnosis not present

## 2018-02-18 DIAGNOSIS — M1712 Unilateral primary osteoarthritis, left knee: Secondary | ICD-10-CM | POA: Diagnosis not present

## 2018-02-18 NOTE — Telephone Encounter (Signed)
Patient received bilateral gel injections today. Can we get prior auth 6 mo from today for repeat?

## 2018-02-18 NOTE — Telephone Encounter (Signed)
Noted  

## 2018-02-20 ENCOUNTER — Encounter (INDEPENDENT_AMBULATORY_CARE_PROVIDER_SITE_OTHER): Payer: Self-pay | Admitting: Orthopedic Surgery

## 2018-02-20 DIAGNOSIS — M1712 Unilateral primary osteoarthritis, left knee: Secondary | ICD-10-CM | POA: Diagnosis not present

## 2018-02-20 DIAGNOSIS — M1711 Unilateral primary osteoarthritis, right knee: Secondary | ICD-10-CM | POA: Diagnosis not present

## 2018-02-20 MED ORDER — LIDOCAINE HCL 1 % IJ SOLN
5.0000 mL | INTRAMUSCULAR | Status: AC | PRN
  Administered 2018-02-20: 5 mL

## 2018-02-20 MED ORDER — HYLAN G-F 20 48 MG/6ML IX SOSY
48.0000 mg | PREFILLED_SYRINGE | INTRA_ARTICULAR | Status: AC | PRN
  Administered 2018-02-20: 48 mg via INTRA_ARTICULAR

## 2018-02-20 NOTE — Progress Notes (Signed)
   Procedure Note  Patient: Brett Berry             Date of Birth: 06-Jun-1951           MRN: [id number]             Visit Date: 02/18/2018  Procedures: Visit Diagnoses: Unilateral primary osteoarthritis, left knee  Unilateral primary osteoarthritis, right knee  Large Joint Inj: bilateral knee on 02/20/2018 8:07 AM Indications: pain, joint swelling and diagnostic evaluation Details: 18 G 1.5 in needle, superolateral approach  Arthrogram: No  Medications (Right): 5 mL lidocaine 1 %; 48 mg Hylan 48 MG/6ML Medications (Left): 5 mL lidocaine 1 %; 48 mg Hylan 48 MG/6ML Outcome: tolerated well, no immediate complications Procedure, treatment alternatives, risks and benefits explained, specific risks discussed. Consent was given by the patient. Immediately prior to procedure a time out was called to verify the correct patient, procedure, equipment, support staff and site/side marked as required. Patient was prepped and draped in the usual sterile fashion.    The left toe is doing reasonably well.  No evidence of infection and the sensation is returning to the dorsal aspect of the toe.  The joint is predictably stiff.

## 2018-02-25 ENCOUNTER — Encounter: Payer: Self-pay | Admitting: Internal Medicine

## 2018-03-02 ENCOUNTER — Other Ambulatory Visit: Payer: Self-pay | Admitting: Internal Medicine

## 2018-03-02 ENCOUNTER — Ambulatory Visit (INDEPENDENT_AMBULATORY_CARE_PROVIDER_SITE_OTHER): Payer: Medicare Other | Admitting: Internal Medicine

## 2018-03-02 ENCOUNTER — Telehealth: Payer: Self-pay

## 2018-03-02 ENCOUNTER — Encounter: Payer: Self-pay | Admitting: Internal Medicine

## 2018-03-02 VITALS — BP 132/76 | HR 56 | Temp 97.8°F | Resp 14 | Ht 70.0 in | Wt 186.0 lb

## 2018-03-02 DIAGNOSIS — E785 Hyperlipidemia, unspecified: Secondary | ICD-10-CM

## 2018-03-02 DIAGNOSIS — Z0000 Encounter for general adult medical examination without abnormal findings: Secondary | ICD-10-CM

## 2018-03-02 DIAGNOSIS — J310 Chronic rhinitis: Secondary | ICD-10-CM

## 2018-03-02 DIAGNOSIS — L989 Disorder of the skin and subcutaneous tissue, unspecified: Secondary | ICD-10-CM | POA: Diagnosis not present

## 2018-03-02 MED ORDER — ESOMEPRAZOLE MAGNESIUM 40 MG PO CPDR: 40.0000 mg | DELAYED_RELEASE_CAPSULE | Freq: Two times a day (BID) | ORAL | 3 refills | Status: DC

## 2018-03-02 MED ORDER — BUDESONIDE-FORMOTEROL FUMARATE 160-4.5 MCG/ACT IN AERO: 2.0000 | INHALATION_SPRAY | Freq: Two times a day (BID) | RESPIRATORY_TRACT | 3 refills | Status: DC

## 2018-03-02 MED ORDER — LEVALBUTEROL TARTRATE 45 MCG/ACT IN AERO: 1.0000 | INHALATION_SPRAY | RESPIRATORY_TRACT | 5 refills | Status: DC | PRN

## 2018-03-02 MED ORDER — CELECOXIB 100 MG PO CAPS: 100.0000 mg | ORAL_CAPSULE | Freq: Two times a day (BID) | ORAL | 1 refills | Status: DC | PRN

## 2018-03-02 MED ORDER — ZOSTER VAC RECOMB ADJUVANTED 50 MCG/0.5ML IM SUSR: 0.5000 mL | Freq: Once | INTRAMUSCULAR | 1 refills | Status: AC

## 2018-03-02 NOTE — Patient Instructions (Signed)
GO TO THE LAB : Get the blood work     GO TO THE FRONT DESK Schedule your next appointment for a   physical exam in 1 year  Please consider Shingrix

## 2018-03-02 NOTE — Telephone Encounter (Signed)
PA approved.   Request Reference Number: [id number]. XOPENEX HFA AER is approved through 08/02/2018. For further questions, call [phone]

## 2018-03-02 NOTE — Assessment & Plan Note (Addendum)
-  Td 2013; PNM shot 2013; prevnar 2015; Shingles shot 01-2015; shingrex recommended, will get at the pharmacy, RX printed  -CCS- 09/25/11-adenomatous polyps and diverticulosis; due for a cscope, plans to proceed , encouraged to do -DRE wnl, check a PSA  Labs: CMP, FLP, CBC, A1c, TSH, PSA -Diet -exercise discussed

## 2018-03-02 NOTE — Progress Notes (Signed)
Pre visit review using our clinic review tool, if applicable. No additional management support is needed unless otherwise documented below in the visit note. 

## 2018-03-02 NOTE — Telephone Encounter (Signed)
PA initiated via Covermymeds; KEY: [id number]. Awaiting determination.

## 2018-03-02 NOTE — Progress Notes (Signed)
Subjective:    Patient ID: Brett Berry, male    DOB: 09-20-1950, 67 y.o.   MRN: [id number]  DOS:  03/02/2018 Type of visit - description : CPX Interval history:  no major concerns   Review of Systems Has DJD, knees, follow-up by Ortho Asthma well-controlled on Symbicort, hardly ever uses rescue inhalers.  Has not use nebulization in long time New skin lesion, right arm.  Other than above, a 14 point review of systems is negative      Past Medical History:  Diagnosis Date  . Allergic rhinitis   . Asthma   . Bilateral knee pain 2016   x-ray showed medial compartment arthritis, worse on right than left  . Erectile dysfunction 07/30/2014  . Hearing difficulty    has aids  . Hyperlipidemia    intolerant to zocor  . Sinusitis, chronic     Past Surgical History:  Procedure Laterality Date  . CATARACT EXTRACTION Right 2016  . MASTOIDECTOMY     right   . right hand surg     for infection in '07  . SINUS ENDO WITH FUSION      Social History   Socioeconomic History  . Marital status: Single    Spouse name: Not on file  . Number of children: 1  . Years of education: Not on file  . Highest education level: Not on file  Occupational History  . Occupation: Environmental education officer: [employer]  . Financial resource strain: Not on file  . Food insecurity:    Worry: Not on file    Inability: Not on file  . Transportation needs:    Medical: Not on file    Non-medical: Not on file  Tobacco Use  . Smoking status: Former Smoker    Last attempt to quit: 09/08/1973    Years since quitting: 44.5  . Smokeless tobacco: Never Used  Substance and Sexual Activity  . Alcohol use: Yes    Comment: 2-3 times week  . Drug use: No  . Sexual activity: Yes    Partners: Female  Lifestyle  . Physical activity:    Days per week: Not on file    Minutes per session: Not on file  . Stress: Not on file  Relationships  . Social connections:   Talks on phone: Not on file    Gets together: Not on file    Attends religious service: Not on file    Active member of club or organization: Not on file    Attends meetings of clubs or organizations: Not on file    Relationship status: Not on file  . Intimate partner violence:    Fear of current or ex partner: Not on file    Emotionally abused: Not on file    Physically abused: Not on file    Forced sexual activity: Not on file  Other Topics Concern  . Not on file  Social History Narrative   HSG. Married '76- 6 years divorced ; '85. 1 son- '86.    Lost wife 12-2014     Family History  Problem Relation Age of Onset  . COPD Mother   . Breast cancer Mother   . Alzheimer's disease Father   . Lung cancer Father        smoker  . Prostate cancer Father        died age 32  . Heart disease Father  age 48  . Colon cancer Neg Hx      Allergies as of 03/02/2018   No Known Allergies     Medication List        Accurate as of 03/02/18 11:59 PM. Always use your most recent med list.          acetaminophen 500 MG tablet Commonly known as:  TYLENOL Take 1,000-1,500 mg by mouth every 3 (three) hours as needed for mild pain, moderate pain, fever or headache.   albuterol (2.5 MG/3ML) 0.083% nebulizer solution Commonly known as:  PROVENTIL Take 3 mLs (2.5 mg total) by nebulization every 4 (four) hours as needed for wheezing or shortness of breath.   budesonide-formoterol 160-4.5 MCG/ACT inhaler Commonly known as:  SYMBICORT Inhale 2 puffs into the lungs 2 (two) times daily.   celecoxib 100 MG capsule Commonly known as:  CELEBREX Take 1 capsule (100 mg total) by mouth 2 (two) times daily as needed for moderate pain.   esomeprazole 40 MG capsule Commonly known as:  NEXIUM Take 1 capsule (40 mg total) by mouth 2 (two) times daily before a meal.   Fish Oil 1000 MG Caps Take 1 capsule by mouth 2 (two) times daily.   fluticasone 50 MCG/ACT nasal spray Commonly known as:   FLONASE Place 2 sprays into the nose daily.   levalbuterol 45 MCG/ACT inhaler Commonly known as:  XOPENEX HFA Inhale 1-2 puffs into the lungs every 4 (four) hours as needed for wheezing or shortness of breath.   multivitamin with minerals Tabs tablet Take 1 tablet by mouth daily.   Zoster Vaccine Adjuvanted injection Commonly known as:  SHINGRIX Inject 0.5 mLs into the muscle once for 1 dose.          Objective:   Physical Exam  Skin:      BP 132/76 (BP Location: Left Arm, Patient Position: Sitting, Cuff Size: Small)   Pulse (!) 56   Temp 97.8 F (36.6 C) (Oral)   Resp 14   Ht 5\' 10"  (1.778 m)   Wt 186 lb (84.4 kg)   SpO2 98%   BMI 26.69 kg/m  General: Well developed, NAD, see BMI.  Neck: No  thyromegaly  HEENT:  Normocephalic . Face symmetric, atraumatic Lungs:  CTA B Normal respiratory effort, no intercostal retractions, no accessory muscle use. Heart: RRR,  no murmur.  No pretibial edema bilaterally  Abdomen:  Not distended, soft, non-tender. No rebound or rigidity.   Skin: Exposed areas without rash. Not pale. Not jaundice Rectal: External abnormalities: none. Normal sphincter tone. No rectal masses or tenderness.  Brown stools Prostate: Prostate gland firm and smooth, mild gland enlargement, no nodularity, tenderness, mass, asymmetry or induration Neurologic:  alert & oriented X3.  Speech normal, gait appropriate for age and unassisted Strength symmetric and appropriate for age.  Psych: Cognition and judgment appear intact.  Cooperative with normal attention span and concentration.  Behavior appropriate. No anxious or depressed appearing.     Assessment & Plan:   Assessment Hyperlipidemia ---intolerant to simvastatin Asthma GERD E.D. Chronic sinusitis, Rhinitis (ENT Dr Lucia Gaskins) DJD- knees R>>L, wrists  , Dr Sharol Given [facility] has aids  PLAN: Hyperlipidemia: Diet controlled, checking labs Asthma: Refill Symbicort, hardly ever uses a rescue  inhaler GERD: Takes Nexium qd more frequent than B.I.D. DJD: Follow-up by Ortho, on frequent Celebrex, Tylenol. Skin lesion, right arm, new for 8 months, referred to dermatology Needs results sent by letter RTC 1 year

## 2018-03-03 LAB — CBC WITH DIFFERENTIAL/PLATELET
Basophils Absolute: 0.1 10*3/uL (ref 0.0–0.1)
Basophils Relative: 1.2 % (ref 0.0–3.0)
EOS ABS: 0.6 10*3/uL (ref 0.0–0.7)
Eosinophils Relative: 8.5 % — ABNORMAL HIGH (ref 0.0–5.0)
HCT: 38.7 % — ABNORMAL LOW (ref 39.0–52.0)
Hemoglobin: 13.1 g/dL (ref 13.0–17.0)
LYMPHS ABS: 2.7 10*3/uL (ref 0.7–4.0)
Lymphocytes Relative: 36.1 % (ref 12.0–46.0)
MCHC: 33.8 g/dL (ref 30.0–36.0)
MCV: 97.6 fl (ref 78.0–100.0)
MONOS PCT: 7.3 % (ref 3.0–12.0)
Monocytes Absolute: 0.5 10*3/uL (ref 0.1–1.0)
NEUTROS ABS: 3.5 10*3/uL (ref 1.4–7.7)
NEUTROS PCT: 46.9 % (ref 43.0–77.0)
PLATELETS: 283 10*3/uL (ref 150.0–400.0)
RBC: 3.96 Mil/uL — AB (ref 4.22–5.81)
RDW: 13.8 % (ref 11.5–15.5)
WBC: 7.4 10*3/uL (ref 4.0–10.5)

## 2018-03-03 LAB — LIPID PANEL
CHOL/HDL RATIO: 4
Cholesterol: 215 mg/dL — ABNORMAL HIGH (ref 0–200)
HDL: 54.2 mg/dL (ref >39.00)
LDL CALC: 145 mg/dL — AB (ref 0–99)
NONHDL: 160.95
TRIGLYCERIDES: 80 mg/dL (ref 0.0–149.0)
VLDL: 16 mg/dL (ref 0.0–40.0)

## 2018-03-03 LAB — COMPREHENSIVE METABOLIC PANEL
ALT: 15 U/L (ref 0–53)
AST: 16 U/L (ref 0–37)
Albumin: 4.2 g/dL (ref 3.5–5.2)
Alkaline Phosphatase: 52 U/L (ref 39–117)
BILIRUBIN TOTAL: 0.9 mg/dL (ref 0.2–1.2)
BUN: 22 mg/dL (ref 6–23)
CHLORIDE: 104 meq/L (ref 96–112)
CO2: 30 mEq/L (ref 19–32)
CREATININE: 1.17 mg/dL (ref 0.40–1.50)
Calcium: 9.5 mg/dL (ref 8.4–10.5)
GFR: 66.44 mL/min (ref >60.00)
GLUCOSE: 91 mg/dL (ref 70–99)
Potassium: 4.6 mEq/L (ref 3.5–5.1)
Sodium: 140 mEq/L (ref 135–145)
Total Protein: 6.7 g/dL (ref 6.0–8.3)

## 2018-03-03 LAB — TSH: TSH: 3.31 u[IU]/mL (ref 0.35–4.50)

## 2018-03-03 LAB — PSA: PSA: 2.22 ng/mL (ref 0.10–4.00)

## 2018-03-03 LAB — HEMOGLOBIN A1C: HEMOGLOBIN A1C: 5.6 % (ref 4.6–6.5)

## 2018-03-03 NOTE — Assessment & Plan Note (Signed)
Hyperlipidemia: Diet controlled, checking labs Asthma: Refill Symbicort, hardly ever uses a rescue inhaler GERD: Takes Nexium qd more frequent than B.I.D. DJD: Follow-up by Ortho, on frequent Celebrex, Tylenol. Skin lesion, right arm, new for 8 months, referred to dermatology Needs results sent by letter RTC 1 year

## 2018-03-09 ENCOUNTER — Telehealth (INDEPENDENT_AMBULATORY_CARE_PROVIDER_SITE_OTHER): Payer: Self-pay

## 2018-03-09 NOTE — Telephone Encounter (Signed)
Submitted for VOB, SynviscOne, bilateral knee.  Appt.needs to be made after 08/21/2018.

## 2018-05-11 ENCOUNTER — Encounter (INDEPENDENT_AMBULATORY_CARE_PROVIDER_SITE_OTHER): Payer: Self-pay | Admitting: Orthopedic Surgery

## 2018-05-11 ENCOUNTER — Ambulatory Visit (INDEPENDENT_AMBULATORY_CARE_PROVIDER_SITE_OTHER): Payer: Medicare Other | Admitting: Orthopedic Surgery

## 2018-05-11 DIAGNOSIS — R200 Anesthesia of skin: Secondary | ICD-10-CM | POA: Diagnosis not present

## 2018-05-11 DIAGNOSIS — G5603 Carpal tunnel syndrome, bilateral upper limbs: Secondary | ICD-10-CM

## 2018-05-11 NOTE — Progress Notes (Signed)
Office Visit Note   Patient: Brett Berry           Date of Birth: [birth year]           MRN: [id number] Visit Date: 05/11/2018 Requested by: Colon Branch, [address] [place], [place] [address] PCP: Colon Branch, MD  Subjective: Chief Complaint  Patient presents with  . Right Hand - Numbness    HPI: Brett Berry is a patient with right hand pain.  Describes numbness in digits 1 through 4.'s been going on 6 weeks.  Numbness is constant.  Denies any history of injury.  Reports decreased strength in grip.  Is having a hard time picking things up.  Denies any neck or radicular symptoms.  Left hand has similar symptoms but nighttime only.  He has tried a splint at night for the right wrist and it did not help.  He would like to try a splint for the left wrist.  He works as a Freight forwarder at a [employer].              ROS: All systems reviewed are negative as they relate to the chief complaint within the history of present illness.  Patient denies  fevers or chills.   Assessment & Plan: Visit Diagnoses:  1. Hand numbness   2. Carpal tunnel syndrome, bilateral     Plan: Impression is moderate to severe right hand carpal tunnel syndrome with constant symptoms at this time.  Plan is EMG nerve study to evaluate for carpal tunnel syndrome bilaterally.  I think that he likely has an operative problem which is failed conservative management.  I will see him back after the study  Follow-Up Instructions: No follow-ups on file.   Orders:  Orders Placed This Encounter  Procedures  . Ambulatory referral to Physical Medicine Rehab   No orders of the defined types were placed in this encounter.     Procedures: No procedures performed   Clinical Data: No additional findings.  Objective: Vital Signs: There were no vitals taken for this visit.  Physical Exam:   Constitutional: Patient appears well-developed HEENT:  Head: Normocephalic Eyes:EOM are normal Neck: Normal  range of motion Cardiovascular: Normal rate Pulmonary/chest: Effort normal Neurologic: Patient is alert Skin: Skin is warm Psychiatric: Patient has normal mood and affect    Ortho Exam: Ortho exam demonstrates full active and passive range of motion of the neck.  Elbow range of motion intact bilaterally.  Grip strength intact bilaterally.  Abductor pollicis brevis is intact.  Patient does have paresthesias digits 1-3 in the radial side of 4.  Has good EPL FPL interosseous function.  Specialty Comments:  No specialty comments available.  Imaging: No results found.   PMFS History: Patient Active Problem List   Diagnosis Date Noted  . CAP (community acquired pneumonia) 08/19/2016  . GERD with esophagitis 08/19/2016  . PCP NOTES >>>>>>>>>>>>>>>>>> 02/18/2016  . DJD (knees, celebrex, ortho Dr Sharol Given) 01/08/2015  . Erectile dysfunction 07/30/2014  . Annual physical exam 12/29/2013  . Non-functioning tympanostomy tube 09/10/2011  . Dysphagia 09/10/2011  . Chronic sinusitis 02/05/2010  . HEADACHE 02/05/2010  . Anxiety -insomnia 01/04/2009  . Hyperlipidemia 01/04/2009  . ALLERGIC RHINITIS 10/27/2007  . Asthma 10/27/2007   Past Medical History:  Diagnosis Date  . Allergic rhinitis   . Asthma   . Bilateral knee pain 2016   x-ray showed medial compartment arthritis, worse on right than left  . Erectile dysfunction 07/30/2014  .  Hearing difficulty    has aids  . Hyperlipidemia    intolerant to zocor  . Sinusitis, chronic     Family History  Problem Relation Age of Onset  . COPD Mother   . Breast cancer Mother   . Alzheimer's disease Father   . Lung cancer Father        smoker  . Prostate cancer Father        died age 24  . Heart disease Father        age 41  . Colon cancer Neg Hx     Past Surgical History:  Procedure Laterality Date  . CATARACT EXTRACTION Right 2016  . MASTOIDECTOMY     right   . right hand surg     for infection in '07  . SINUS ENDO WITH FUSION      Social History   Occupational History  . Occupation: Environmental education officer: [employer]  Tobacco Use  . Smoking status: Former Smoker    Last attempt to quit: 09/08/1973    Years since quitting: 44.7  . Smokeless tobacco: Never Used  Substance and Sexual Activity  . Alcohol use: Yes    Comment: 2-3 times week  . Drug use: No  . Sexual activity: Yes    Partners: Female

## 2018-05-27 ENCOUNTER — Ambulatory Visit (INDEPENDENT_AMBULATORY_CARE_PROVIDER_SITE_OTHER): Payer: Medicare Other | Admitting: Physical Medicine and Rehabilitation

## 2018-05-27 ENCOUNTER — Encounter (INDEPENDENT_AMBULATORY_CARE_PROVIDER_SITE_OTHER): Payer: Self-pay | Admitting: Physical Medicine and Rehabilitation

## 2018-05-27 DIAGNOSIS — R202 Paresthesia of skin: Secondary | ICD-10-CM

## 2018-05-27 NOTE — Progress Notes (Signed)
 .  Numeric Pain Rating Scale and Functional Assessment Average Pain 0   In the last MONTH (on 0-10 scale) has pain interfered with the following?  1. General activity like being  able to carry out your everyday physical activities such as walking, climbing stairs, carrying groceries, or moving a chair?  Rating(7)    

## 2018-05-27 NOTE — Progress Notes (Signed)
Brett Berry - 67 y.o. male MRN [id number]  Date of birth: 08/05/1950  Office Visit Note: Visit Date: 05/27/2018 PCP: Colon Branch, MD Referred by: Colon Branch, MD  Subjective: Chief Complaint  Patient presents with  . Right Hand - Numbness   HPI: Brett Berry is a 67 y.o. male who comes in today For electrodiagnostic study of the right upper limb as requested by Dr. Anderson Malta.  He reports worsening symptoms over the last 2 months with numbness and pain in the right hand particularly at the tips of the first 3 digits and part of the fourth digit on the right hand.  His digits appear to be numb to him fairly constantly.  He does get intermittent times but the symptoms are not there.  He does not feel like it necessarily changes with position or activity.  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  He uses his hands quite a bit at work.  He denies any specific injury.  He is right-hand dominant.  He reports some symptoms on the left mainly only at night.  He does have a positive flick sign.  He has been wearing a splint.  He has no prior electrodiagnostic studies.  He does get some neck pain but no frank radicular pain.    ROS Otherwise per HPI.  Assessment & Plan: Visit Diagnoses:  1. Paresthesia of skin     Plan: Impression: The above electrodiagnostic study is ABNORMAL and reveals evidence of:  1.  A severe right median nerve entrapment at the wrist (carpal tunnel syndrome) affecting sensory and motor components.   2.  A moderate right median nerve entrapment at the wrist (carpal tunnel syndrome) affecting sensory and motor components.    There is no significant electrodiagnostic evidence of any other focal nerve entrapment, brachial plexopathy or generalized peripheral neuropathy.   Recommendations: 1.  Follow-up with referring physician. 2.  Continue current management of symptoms. 3.  Suggest surgical evaluation. 4.  Continue use  of resting splint at night-time and as needed during the day.    Meds & Orders: No orders of the defined types were placed in this encounter.   Orders Placed This Encounter  Procedures  . NCV with EMG (electromyography)    Follow-up: Return for  Anderson Malta, M.D..   Procedures: No procedures performed  EMG & NCV Findings: Evaluation of the left median motor nerve showed prolonged distal onset latency (6.6 ms) and decreased conduction velocity (Elbow-Wrist, 48 m/s).  The right median motor nerve showed prolonged distal onset latency (7.4 ms), reduced amplitude (3.9 mV), and decreased conduction velocity (Elbow-Wrist, 44 m/s).  The left median (across palm) sensory nerve showed prolonged distal peak latency (Wrist, 6.9 ms) and prolonged distal peak latency (Palm, 2.8 ms).  The right median (across palm) sensory nerve showed no response (Wrist) and prolonged distal peak latency (Palm, 5.1 ms).  The right ulnar sensory nerve showed prolonged distal peak latency (4.2 ms) and decreased conduction velocity (Wrist-5th Digit, 33 m/s).  All remaining nerves (as indicated in the following tables) were within normal limits.  Left vs. Right side comparison data for the median motor nerve indicates abnormal L-R latency difference (0.8 ms).    All examined muscles (as indicated in the following table) showed no evidence of electrical instability.    Impression: The above electrodiagnostic study is ABNORMAL and reveals evidence of:  1.  A severe right median nerve entrapment  at the wrist (carpal tunnel syndrome) affecting sensory and motor components.   2.  A moderate right median nerve entrapment at the wrist (carpal tunnel syndrome) affecting sensory and motor components.    There is no significant electrodiagnostic evidence of any other focal nerve entrapment, brachial plexopathy or generalized peripheral neuropathy.   Recommendations: 1.  Follow-up with referring physician. 2.  Continue current  management of symptoms. 3.  Suggest surgical evaluation. 4.  Continue use of resting splint at night-time and as needed during the day.  ___________________________ Wonda Olds Board Certified, American Board of Physical Medicine and Rehabilitation    Nerve Conduction Studies Anti Sensory Summary Table   Stim Site NR Peak (ms) Norm Peak (ms) P-T Amp (V) Norm P-T Amp Site1 Site2 Delta-P (ms) Dist (cm) Vel (m/s) Norm Vel (m/s)  Left Median Acr Palm Anti Sensory (2nd Digit)  30.5C  Wrist    *6.9 <3.6 15.5 >10 Wrist Palm 4.1 0.0    Palm    *2.8 <2.0 7.4         Right Median Acr Palm Anti Sensory (2nd Digit)  31.9C  Wrist *NR  <3.6  >10 Wrist Palm  0.0    Palm    *5.1 <2.0 26.4         Right Radial Anti Sensory (Base 1st Digit)  32.6C  Wrist    2.6 <3.1 22.7  Wrist Base 1st Digit 2.6 0.0    Right Ulnar Anti Sensory (5th Digit)  32C  Wrist    *4.2 <3.7 16.9 >15.0 Wrist 5th Digit 4.2 14.0 *33 >38   Motor Summary Table   Stim Site NR Onset (ms) Norm Onset (ms) O-P Amp (mV) Norm O-P Amp Site1 Site2 Delta-0 (ms) Dist (cm) Vel (m/s) Norm Vel (m/s)  Left Median Motor (Abd Poll Brev)  31.3C  Wrist    *6.6 <4.2 6.7 >5 Elbow Wrist 4.8 23.0 *48 >50  Elbow    11.4  6.2         Right Median Motor (Abd Poll Brev)  32.4C  Wrist    *7.4 <4.2 *3.9 >5 Elbow Wrist 5.2 23.0 *44 >50  Elbow    12.6  3.6         Right Ulnar Motor (Abd Dig Min)  32.1C  Wrist    3.7 <4.2 11.5 >3 B Elbow Wrist 3.9 23.0 59 >53  B Elbow    7.6  12.2  A Elbow B Elbow 1.4 10.0 71 >53  A Elbow    9.0  11.8          EMG   Side Muscle Nerve Root Ins Act Fibs Psw Amp Dur Poly Recrt Int Fraser Din Comment  Right Abd Poll Brev Median C8-T1 Nml Nml Nml Nml Nml 0 Nml Nml   Right 1stDorInt Ulnar C8-T1 Nml Nml Nml Nml Nml 0 Nml Nml     Nerve Conduction Studies Anti Sensory Left/Right Comparison   Stim Site L Lat (ms) R Lat (ms) L-R Lat (ms) L Amp (V) R Amp (V) L-R Amp (%) Site1 Site2 L Vel (m/s) R Vel (m/s) L-R Vel  (m/s)  Median Acr Palm Anti Sensory (2nd Digit)  30.5C  Wrist *6.9   15.5   Wrist Palm     Palm *2.8 *5.1 2.3 7.4 26.4 72.0       Radial Anti Sensory (Base 1st Digit)  32.6C  Wrist  2.6   22.7  Wrist Base 1st Digit     Ulnar Anti Sensory (  5th Digit)  32C  Wrist  *4.2   16.9  Wrist 5th Digit  *33    Motor Left/Right Comparison   Stim Site L Lat (ms) R Lat (ms) L-R Lat (ms) L Amp (mV) R Amp (mV) L-R Amp (%) Site1 Site2 L Vel (m/s) R Vel (m/s) L-R Vel (m/s)  Median Motor (Abd Poll Brev)  31.3C  Wrist *6.6 *7.4 *0.8 6.7 *3.9 41.8 Elbow Wrist *48 *44 4  Elbow 11.4 12.6 1.2 6.2 3.6 41.9       Ulnar Motor (Abd Dig Min)  32.1C  Wrist  3.7   11.5  B Elbow Wrist  59   B Elbow  7.6   12.2  A Elbow B Elbow  71   A Elbow  9.0   11.8           Waveforms:                Clinical History: No specialty comments available.   He reports that he quit smoking about 44 years ago. He has never used smokeless tobacco.  Recent Labs    03/02/18 1458  HGBA1C 5.6    Objective:  VS:  HT:    WT:   BMI:     BP:   HR: bpm  TEMP: ( )  RESP:  Physical Exam  Constitutional: He is oriented to person, place, and time.  Musculoskeletal: He exhibits no edema or tenderness.  Inspection reveals slight flattening of the right APB compared to left but no atrophy of the bilateral APB or FDI or hand intrinsics. There is no swelling, color changes, allodynia or dystrophic changes. There is 5 out of 5 strength in the bilateral wrist extension, finger abduction and long finger flexion.  There is decreased sensation in the median nerve distribution on the right.  There is a positive Phalen's test bilaterally. There is a negative Hoffmann's test bilaterally.  Neurological: He is alert and oriented to person, place, and time. He exhibits normal muscle tone. Coordination normal.  Skin: Skin is warm and dry. No rash noted. No erythema.    Ortho Exam Imaging: No results found.  Past  Medical/Family/Surgical/Social History: Medications & Allergies reviewed per EMR, new medications updated. Patient Active Problem List   Diagnosis Date Noted  . CAP (community acquired pneumonia) 08/19/2016  . GERD with esophagitis 08/19/2016  . PCP NOTES >>>>>>>>>>>>>>>>>> 02/18/2016  . DJD (knees, celebrex, ortho Dr Sharol Given) 01/08/2015  . Erectile dysfunction 07/30/2014  . Annual physical exam 12/29/2013  . Non-functioning tympanostomy tube 09/10/2011  . Dysphagia 09/10/2011  . Chronic sinusitis 02/05/2010  . HEADACHE 02/05/2010  . Anxiety -insomnia 01/04/2009  . Hyperlipidemia 01/04/2009  . ALLERGIC RHINITIS 10/27/2007  . Asthma 10/27/2007   Past Medical History:  Diagnosis Date  . Allergic rhinitis   . Asthma   . Bilateral knee pain 2016   x-ray showed medial compartment arthritis, worse on right than left  . Erectile dysfunction 07/30/2014  . Hearing difficulty    has aids  . Hyperlipidemia    intolerant to zocor  . Sinusitis, chronic    Family History  Problem Relation Age of Onset  . COPD Mother   . Breast cancer Mother   . Alzheimer's disease Father   . Lung cancer Father        smoker  . Prostate cancer Father        died age 42  . Heart disease Father        age 65  .  Colon cancer Neg Hx    Past Surgical History:  Procedure Laterality Date  . CATARACT EXTRACTION Right 2016  . MASTOIDECTOMY     right   . right hand surg     for infection in '07  . SINUS ENDO WITH FUSION     Social History   Occupational History  . Occupation: Environmental education officer: [employer]  Tobacco Use  . Smoking status: Former Smoker    Last attempt to quit: 09/08/1973    Years since quitting: 44.7  . Smokeless tobacco: Never Used  Substance and Sexual Activity  . Alcohol use: Yes    Comment: 2-3 times week  . Drug use: No  . Sexual activity: Yes    Partners: Female

## 2018-05-30 ENCOUNTER — Ambulatory Visit (INDEPENDENT_AMBULATORY_CARE_PROVIDER_SITE_OTHER): Payer: Medicare Other | Admitting: Orthopedic Surgery

## 2018-05-30 ENCOUNTER — Encounter (INDEPENDENT_AMBULATORY_CARE_PROVIDER_SITE_OTHER): Payer: Self-pay | Admitting: Orthopedic Surgery

## 2018-05-30 DIAGNOSIS — G5603 Carpal tunnel syndrome, bilateral upper limbs: Secondary | ICD-10-CM | POA: Diagnosis not present

## 2018-05-30 NOTE — Procedures (Signed)
EMG & NCV Findings: Evaluation of the left median motor nerve showed prolonged distal onset latency (6.6 ms) and decreased conduction velocity (Elbow-Wrist, 48 m/s).  The right median motor nerve showed prolonged distal onset latency (7.4 ms), reduced amplitude (3.9 mV), and decreased conduction velocity (Elbow-Wrist, 44 m/s).  The left median (across palm) sensory nerve showed prolonged distal peak latency (Wrist, 6.9 ms) and prolonged distal peak latency (Palm, 2.8 ms).  The right median (across palm) sensory nerve showed no response (Wrist) and prolonged distal peak latency (Palm, 5.1 ms).  The right ulnar sensory nerve showed prolonged distal peak latency (4.2 ms) and decreased conduction velocity (Wrist-5th Digit, 33 m/s).  All remaining nerves (as indicated in the following tables) were within normal limits.  Left vs. Right side comparison data for the median motor nerve indicates abnormal L-R latency difference (0.8 ms).    All examined muscles (as indicated in the following table) showed no evidence of electrical instability.    Impression: The above electrodiagnostic study is ABNORMAL and reveals evidence of:  1.  A severe right median nerve entrapment at the wrist (carpal tunnel syndrome) affecting sensory and motor components.   2.  A moderate right median nerve entrapment at the wrist (carpal tunnel syndrome) affecting sensory and motor components.    There is no significant electrodiagnostic evidence of any other focal nerve entrapment, brachial plexopathy or generalized peripheral neuropathy.   Recommendations: 1.  Follow-up with referring physician. 2.  Continue current management of symptoms. 3.  Suggest surgical evaluation. 4.  Continue use of resting splint at night-time and as needed during the day.  ___________________________ Wonda Olds Board Certified, American Board of Physical Medicine and Rehabilitation    Nerve Conduction Studies Anti Sensory Summary  Table   Stim Site NR Peak (ms) Norm Peak (ms) P-T Amp (V) Norm P-T Amp Site1 Site2 Delta-P (ms) Dist (cm) Vel (m/s) Norm Vel (m/s)  Left Median Acr Palm Anti Sensory (2nd Digit)  30.5C  Wrist    *6.9 <3.6 15.5 >10 Wrist Palm 4.1 0.0    Palm    *2.8 <2.0 7.4         Right Median Acr Palm Anti Sensory (2nd Digit)  31.9C  Wrist *NR  <3.6  >10 Wrist Palm  0.0    Palm    *5.1 <2.0 26.4         Right Radial Anti Sensory (Base 1st Digit)  32.6C  Wrist    2.6 <3.1 22.7  Wrist Base 1st Digit 2.6 0.0    Right Ulnar Anti Sensory (5th Digit)  32C  Wrist    *4.2 <3.7 16.9 >15.0 Wrist 5th Digit 4.2 14.0 *33 >38   Motor Summary Table   Stim Site NR Onset (ms) Norm Onset (ms) O-P Amp (mV) Norm O-P Amp Site1 Site2 Delta-0 (ms) Dist (cm) Vel (m/s) Norm Vel (m/s)  Left Median Motor (Abd Poll Brev)  31.3C  Wrist    *6.6 <4.2 6.7 >5 Elbow Wrist 4.8 23.0 *48 >50  Elbow    11.4  6.2         Right Median Motor (Abd Poll Brev)  32.4C  Wrist    *7.4 <4.2 *3.9 >5 Elbow Wrist 5.2 23.0 *44 >50  Elbow    12.6  3.6         Right Ulnar Motor (Abd Dig Min)  32.1C  Wrist    3.7 <4.2 11.5 >3 B Elbow Wrist 3.9 23.0 59 >53  B Elbow  7.6  12.2  A Elbow B Elbow 1.4 10.0 71 >53  A Elbow    9.0  11.8          EMG   Side Muscle Nerve Root Ins Act Fibs Psw Amp Dur Poly Recrt Int Fraser Din Comment  Right Abd Poll Brev Median C8-T1 Nml Nml Nml Nml Nml 0 Nml Nml   Right 1stDorInt Ulnar C8-T1 Nml Nml Nml Nml Nml 0 Nml Nml     Nerve Conduction Studies Anti Sensory Left/Right Comparison   Stim Site L Lat (ms) R Lat (ms) L-R Lat (ms) L Amp (V) R Amp (V) L-R Amp (%) Site1 Site2 L Vel (m/s) R Vel (m/s) L-R Vel (m/s)  Median Acr Palm Anti Sensory (2nd Digit)  30.5C  Wrist *6.9   15.5   Wrist Palm     Palm *2.8 *5.1 2.3 7.4 26.4 72.0       Radial Anti Sensory (Base 1st Digit)  32.6C  Wrist  2.6   22.7  Wrist Base 1st Digit     Ulnar Anti Sensory (5th Digit)  32C  Wrist  *4.2   16.9  Wrist 5th Digit  *33    Motor  Left/Right Comparison   Stim Site L Lat (ms) R Lat (ms) L-R Lat (ms) L Amp (mV) R Amp (mV) L-R Amp (%) Site1 Site2 L Vel (m/s) R Vel (m/s) L-R Vel (m/s)  Median Motor (Abd Poll Brev)  31.3C  Wrist *6.6 *7.4 *0.8 6.7 *3.9 41.8 Elbow Wrist *48 *44 4  Elbow 11.4 12.6 1.2 6.2 3.6 41.9       Ulnar Motor (Abd Dig Min)  32.1C  Wrist  3.7   11.5  B Elbow Wrist  59   B Elbow  7.6   12.2  A Elbow B Elbow  71   A Elbow  9.0   11.8           Waveforms:

## 2018-05-30 NOTE — Progress Notes (Signed)
Office Visit Note   Patient: Brett Berry           Date of Birth: Dec 29, 1950           MRN: [id number] Visit Date: 05/30/2018 Requested by: Colon Branch, [address] [place], [place] [address] PCP: Colon Branch, MD  Subjective: Chief Complaint  Patient presents with  . Follow-up    EMG/NCV review    HPI: Brett Berry is a 67 year old patient with bilateral upper extremity numbness and tingling.  Right is worse than the left.  Since I seen him he had an EMG nerve study which shows severe right-sided carpal tunnel syndrome.  He reports significant pain but more numbness and loss of dexterity in that right hand.  Left hand is less symptomatic.  Symptoms been ongoing for 2 to 3 months.  He runs a factory.  He also needs to have right total knee replacement due to end-stage arthritis in the knee              ROS: All systems reviewed are negative as they relate to the chief complaint within the history of present illness.  Patient denies  fevers or chills.   Assessment & Plan: Visit Diagnoses: No diagnosis found.  Plan: Impression is severe right carpal tunnel syndrome with loss of dexterity and constant numbness and tingling.  Plan is carpal tunnel release.  Risks and benefits are discussed including but not limited to infection nerve vessel damage recovery time as well as some loss of grip strength.  Patient understands the risk benefits and wishes to proceed.  I think he would be out of work about a week.  We also discussed the recovery from total knee replacement.  All questions answered.  Follow-Up Instructions: No follow-ups on file.   Orders:  No orders of the defined types were placed in this encounter.  No orders of the defined types were placed in this encounter.     Procedures: No procedures performed   Clinical Data: No additional findings.  Objective: Vital Signs: There were no vitals taken for this visit.  Physical Exam:   Constitutional:  Patient appears well-developed HEENT:  Head: Normocephalic Eyes:EOM are normal Neck: Normal range of motion Cardiovascular: Normal rate Pulmonary/chest: Effort normal Neurologic: Patient is alert Skin: Skin is warm Psychiatric: Patient has normal mood and affect    Ortho Exam: Ortho exam demonstrates full active and passive range of motion of the wrist.  Not much in the way of thenar wasting on the right.  Radial pulses intact.  Does have paresthesias in the radial portion of the right hand volar aspect.  Negative Tinel's cubital tunnel in the elbow  Specialty Comments:  No specialty comments available.  Imaging: No results found.   PMFS History: Patient Active Problem List   Diagnosis Date Noted  . CAP (community acquired pneumonia) 08/19/2016  . GERD with esophagitis 08/19/2016  . PCP NOTES >>>>>>>>>>>>>>>>>> 02/18/2016  . DJD (knees, celebrex, ortho Dr Sharol Given) 01/08/2015  . Erectile dysfunction 07/30/2014  . Annual physical exam 12/29/2013  . Non-functioning tympanostomy tube 09/10/2011  . Dysphagia 09/10/2011  . Chronic sinusitis 02/05/2010  . HEADACHE 02/05/2010  . Anxiety -insomnia 01/04/2009  . Hyperlipidemia 01/04/2009  . ALLERGIC RHINITIS 10/27/2007  . Asthma 10/27/2007   Past Medical History:  Diagnosis Date  . Allergic rhinitis   . Asthma   . Bilateral knee pain 2016   x-ray showed medial compartment arthritis, worse on right  than left  . Erectile dysfunction 07/30/2014  . Hearing difficulty    has aids  . Hyperlipidemia    intolerant to zocor  . Sinusitis, chronic     Family History  Problem Relation Age of Onset  . COPD Mother   . Breast cancer Mother   . Alzheimer's disease Father   . Lung cancer Father        smoker  . Prostate cancer Father        died age 59  . Heart disease Father        age 56  . Colon cancer Neg Hx     Past Surgical History:  Procedure Laterality Date  . CATARACT EXTRACTION Right 2016  . MASTOIDECTOMY     right     . right hand surg     for infection in '07  . SINUS ENDO WITH FUSION     Social History   Occupational History  . Occupation: Environmental education officer: [employer]  Tobacco Use  . Smoking status: Former Smoker    Last attempt to quit: 09/08/1973    Years since quitting: 44.7  . Smokeless tobacco: Never Used  Substance and Sexual Activity  . Alcohol use: Yes    Comment: 2-3 times week  . Drug use: No  . Sexual activity: Yes    Partners: Female

## 2018-06-03 HISTORY — PX: CARPAL TUNNEL RELEASE: SHX101

## 2018-06-14 DIAGNOSIS — H40013 Open angle with borderline findings, low risk, bilateral: Secondary | ICD-10-CM | POA: Diagnosis not present

## 2018-06-14 DIAGNOSIS — H0011 Chalazion right upper eyelid: Secondary | ICD-10-CM | POA: Diagnosis not present

## 2018-06-14 DIAGNOSIS — H2512 Age-related nuclear cataract, left eye: Secondary | ICD-10-CM | POA: Diagnosis not present

## 2018-06-14 DIAGNOSIS — Z961 Presence of intraocular lens: Secondary | ICD-10-CM | POA: Diagnosis not present

## 2018-06-17 ENCOUNTER — Other Ambulatory Visit: Payer: Self-pay | Admitting: Ophthalmology

## 2018-06-17 DIAGNOSIS — L308 Other specified dermatitis: Secondary | ICD-10-CM | POA: Diagnosis not present

## 2018-06-22 DIAGNOSIS — H11241 Scarring of conjunctiva, right eye: Secondary | ICD-10-CM | POA: Diagnosis not present

## 2018-06-27 DIAGNOSIS — G5601 Carpal tunnel syndrome, right upper limb: Secondary | ICD-10-CM | POA: Diagnosis not present

## 2018-07-04 ENCOUNTER — Encounter (INDEPENDENT_AMBULATORY_CARE_PROVIDER_SITE_OTHER): Payer: Self-pay | Admitting: Orthopedic Surgery

## 2018-07-04 ENCOUNTER — Ambulatory Visit (INDEPENDENT_AMBULATORY_CARE_PROVIDER_SITE_OTHER): Payer: Medicare Other | Admitting: Orthopedic Surgery

## 2018-07-04 DIAGNOSIS — G5603 Carpal tunnel syndrome, bilateral upper limbs: Secondary | ICD-10-CM

## 2018-07-04 NOTE — Progress Notes (Signed)
   Post-Op Visit Note   Patient: Brett Berry           Date of Birth: 1950-08-20           MRN: [id number] Visit Date: 07/04/2018 PCP: Colon Branch, MD   Assessment & Plan:  Chief Complaint:  Chief Complaint  Patient presents with  . Right Wrist - Pain   Visit Diagnoses:  1. Carpal tunnel syndrome, bilateral     Plan: Brett Berry is a patient is now a week out right carpal tunnel release.  On exam the incision is intact and the hand is functional.  Abductor pollicis brevis strength is intact.  Plan at this time is to remove the splint in place a soft dressing.  Follow-up with me in 4 days for suture removal.  I did give him a 69-month handicap sticker because of the severe arthritis in his knee.  Follow-Up Instructions: No follow-ups on file.   Orders:  No orders of the defined types were placed in this encounter.  No orders of the defined types were placed in this encounter.   Imaging: No results found.  PMFS History: Patient Active Problem List   Diagnosis Date Noted  . CAP (community acquired pneumonia) 08/19/2016  . GERD with esophagitis 08/19/2016  . PCP NOTES >>>>>>>>>>>>>>>>>> 02/18/2016  . DJD (knees, celebrex, ortho Dr Sharol Given) 01/08/2015  . Erectile dysfunction 07/30/2014  . Annual physical exam 12/29/2013  . Non-functioning tympanostomy tube 09/10/2011  . Dysphagia 09/10/2011  . Chronic sinusitis 02/05/2010  . HEADACHE 02/05/2010  . Anxiety -insomnia 01/04/2009  . Hyperlipidemia 01/04/2009  . ALLERGIC RHINITIS 10/27/2007  . Asthma 10/27/2007   Past Medical History:  Diagnosis Date  . Allergic rhinitis   . Asthma   . Bilateral knee pain 2016   x-ray showed medial compartment arthritis, worse on right than left  . Erectile dysfunction 07/30/2014  . Hearing difficulty    has aids  . Hyperlipidemia    intolerant to zocor  . Sinusitis, chronic     Family History  Problem Relation Age of Onset  . COPD Mother   . Breast cancer Mother   . Alzheimer's  disease Father   . Lung cancer Father        smoker  . Prostate cancer Father        died age 74  . Heart disease Father        age 16  . Colon cancer Neg Hx     Past Surgical History:  Procedure Laterality Date  . CATARACT EXTRACTION Right 2016  . MASTOIDECTOMY     right   . right hand surg     for infection in '07  . SINUS ENDO WITH FUSION     Social History   Occupational History  . Occupation: Environmental education officer: [employer]  Tobacco Use  . Smoking status: Former Smoker    Last attempt to quit: 09/08/1973    Years since quitting: 44.8  . Smokeless tobacco: Never Used  Substance and Sexual Activity  . Alcohol use: Yes    Comment: 2-3 times week  . Drug use: No  . Sexual activity: Yes    Partners: Female

## 2018-07-08 ENCOUNTER — Ambulatory Visit (INDEPENDENT_AMBULATORY_CARE_PROVIDER_SITE_OTHER): Payer: Medicare Other | Admitting: Orthopedic Surgery

## 2018-07-08 ENCOUNTER — Encounter (INDEPENDENT_AMBULATORY_CARE_PROVIDER_SITE_OTHER): Payer: Self-pay | Admitting: Orthopedic Surgery

## 2018-07-08 DIAGNOSIS — G5603 Carpal tunnel syndrome, bilateral upper limbs: Secondary | ICD-10-CM

## 2018-07-09 ENCOUNTER — Encounter (INDEPENDENT_AMBULATORY_CARE_PROVIDER_SITE_OTHER): Payer: Self-pay | Admitting: Orthopedic Surgery

## 2018-07-09 NOTE — Progress Notes (Signed)
   Post-Op Visit Note   Patient: Brett Berry           Date of Birth: 1950-12-02           MRN: [id number] Visit Date: 07/08/2018 PCP: Brett Branch, MD   Assessment & Plan:  Chief Complaint:  Chief Complaint  Patient presents with  . Right Hand - Routine Post Op   Visit Diagnoses:  1. Carpal tunnel syndrome, bilateral     Plan: Brett Berry is a patient with right carpal tunnel release surgery now 10 days out.  He is doing well.  On exam the incision is intact and the sutures are removed.  He is working and doing well with that.  He may want to proceed with bilateral total knee replacement sometime in the future.  I will see him back as needed  Follow-Up Instructions: Return if symptoms worsen or fail to improve.   Orders:  No orders of the defined types were placed in this encounter.  No orders of the defined types were placed in this encounter.   Imaging: No results found.  PMFS History: Patient Active Problem List   Diagnosis Date Noted  . CAP (community acquired pneumonia) 08/19/2016  . GERD with esophagitis 08/19/2016  . PCP NOTES >>>>>>>>>>>>>>>>>> 02/18/2016  . DJD (knees, celebrex, ortho Dr Sharol Given) 01/08/2015  . Erectile dysfunction 07/30/2014  . Annual physical exam 12/29/2013  . Non-functioning tympanostomy tube 09/10/2011  . Dysphagia 09/10/2011  . Chronic sinusitis 02/05/2010  . HEADACHE 02/05/2010  . Anxiety -insomnia 01/04/2009  . Hyperlipidemia 01/04/2009  . ALLERGIC RHINITIS 10/27/2007  . Asthma 10/27/2007   Past Medical History:  Diagnosis Date  . Allergic rhinitis   . Asthma   . Bilateral knee pain 2016   x-ray showed medial compartment arthritis, worse on right than left  . Erectile dysfunction 07/30/2014  . Hearing difficulty    has aids  . Hyperlipidemia    intolerant to zocor  . Sinusitis, chronic     Family History  Problem Relation Age of Onset  . COPD Mother   . Breast cancer Mother   . Alzheimer's disease Father   . Lung cancer  Father        smoker  . Prostate cancer Father        died age 11  . Heart disease Father        age 3  . Brett cancer Neg Hx     Past Surgical History:  Procedure Laterality Date  . CATARACT EXTRACTION Right 2016  . MASTOIDECTOMY     right   . right hand surg     for infection in '07  . SINUS ENDO WITH FUSION     Social History   Occupational History  . Occupation: Environmental education officer: [employer]  Tobacco Use  . Smoking status: Former Smoker    Last attempt to quit: 09/08/1973    Years since quitting: 44.8  . Smokeless tobacco: Never Used  Substance and Sexual Activity  . Alcohol use: Yes    Comment: 2-3 times week  . Drug use: No  . Sexual activity: Yes    Partners: Female

## 2018-07-13 DIAGNOSIS — H1045 Other chronic allergic conjunctivitis: Secondary | ICD-10-CM | POA: Diagnosis not present

## 2018-07-24 ENCOUNTER — Encounter (HOSPITAL_BASED_OUTPATIENT_CLINIC_OR_DEPARTMENT_OTHER): Payer: Self-pay | Admitting: Emergency Medicine

## 2018-07-24 ENCOUNTER — Other Ambulatory Visit: Payer: Self-pay

## 2018-07-24 ENCOUNTER — Emergency Department (HOSPITAL_BASED_OUTPATIENT_CLINIC_OR_DEPARTMENT_OTHER)
Admission: EM | Admit: 2018-07-24 | Discharge: 2018-07-24 | Disposition: A | Discharge status: Home / Self Care | Payer: Medicare Other | Attending: Emergency Medicine | Admitting: Emergency Medicine

## 2018-07-24 ENCOUNTER — Emergency Department (HOSPITAL_BASED_OUTPATIENT_CLINIC_OR_DEPARTMENT_OTHER): Payer: Medicare Other

## 2018-07-24 DIAGNOSIS — R079 Chest pain, unspecified: Secondary | ICD-10-CM | POA: Diagnosis not present

## 2018-07-24 DIAGNOSIS — J189 Pneumonia, unspecified organism: Secondary | ICD-10-CM | POA: Diagnosis not present

## 2018-07-24 DIAGNOSIS — Z87891 Personal history of nicotine dependence: Secondary | ICD-10-CM | POA: Diagnosis not present

## 2018-07-24 DIAGNOSIS — Z79899 Other long term (current) drug therapy: Secondary | ICD-10-CM | POA: Insufficient documentation

## 2018-07-24 DIAGNOSIS — R0602 Shortness of breath: Secondary | ICD-10-CM | POA: Diagnosis present

## 2018-07-24 DIAGNOSIS — J181 Lobar pneumonia, unspecified organism: Secondary | ICD-10-CM

## 2018-07-24 DIAGNOSIS — J45909 Unspecified asthma, uncomplicated: Secondary | ICD-10-CM | POA: Insufficient documentation

## 2018-07-24 LAB — TROPONIN I: Troponin I: <0.03 ng/mL (ref <0.03)

## 2018-07-24 LAB — CBC WITH DIFFERENTIAL/PLATELET
Abs Immature Granulocytes: 0.09 10*3/uL — ABNORMAL HIGH (ref 0.00–0.07)
BASOS ABS: 0.1 10*3/uL (ref 0.0–0.1)
Basophils Relative: 0 %
Eosinophils Absolute: 0.4 10*3/uL (ref 0.0–0.5)
Eosinophils Relative: 3 %
HCT: 39.9 % (ref 39.0–52.0)
Hemoglobin: 13.2 g/dL (ref 13.0–17.0)
Immature Granulocytes: 1 %
LYMPHS PCT: 13 %
Lymphs Abs: 1.8 10*3/uL (ref 0.7–4.0)
MCH: 32.2 pg (ref 26.0–34.0)
MCHC: 33.1 g/dL (ref 30.0–36.0)
MCV: 97.3 fL (ref 80.0–100.0)
Monocytes Absolute: 0.8 10*3/uL (ref 0.1–1.0)
Monocytes Relative: 5 %
NRBC: 0 % (ref 0.0–0.2)
Neutro Abs: 11.1 10*3/uL — ABNORMAL HIGH (ref 1.7–7.7)
Neutrophils Relative %: 78 %
Platelets: 271 10*3/uL (ref 150–400)
RBC: 4.1 MIL/uL — ABNORMAL LOW (ref 4.22–5.81)
RDW: 13.2 % (ref 11.5–15.5)
WBC: 14.3 10*3/uL — ABNORMAL HIGH (ref 4.0–10.5)

## 2018-07-24 LAB — BASIC METABOLIC PANEL
Anion gap: 9 (ref 5–15)
BUN: 15 mg/dL (ref 8–23)
CO2: 23 mmol/L (ref 22–32)
Calcium: 8.7 mg/dL — ABNORMAL LOW (ref 8.9–10.3)
Chloride: 103 mmol/L (ref 98–111)
Creatinine, Ser: 1.34 mg/dL — ABNORMAL HIGH (ref 0.61–1.24)
GFR calc Af Amer: >60 mL/min (ref >60)
GFR calc non Af Amer: 55 mL/min — ABNORMAL LOW (ref >60)
Glucose, Bld: 166 mg/dL — ABNORMAL HIGH (ref 70–99)
POTASSIUM: 3.2 mmol/L — AB (ref 3.5–5.1)
Sodium: 135 mmol/L (ref 135–145)

## 2018-07-24 MED ORDER — SODIUM CHLORIDE 0.9 % IV SOLN
500.0000 mg | Freq: Once | INTRAVENOUS | Status: AC
  Administered 2018-07-24: 500 mg via INTRAVENOUS
  Filled 2018-07-24: qty 500

## 2018-07-24 MED ORDER — SODIUM CHLORIDE 0.9 % IV SOLN
INTRAVENOUS | Status: DC | PRN
  Administered 2018-07-24: 250 mL via INTRAVENOUS

## 2018-07-24 MED ORDER — AZITHROMYCIN 250 MG PO TABS: 250.0000 mg | ORAL_TABLET | Freq: Every day | ORAL | 0 refills | Status: DC

## 2018-07-24 MED ORDER — ALBUTEROL SULFATE (2.5 MG/3ML) 0.083% IN NEBU
2.5000 mg | INHALATION_SOLUTION | Freq: Once | RESPIRATORY_TRACT | Status: DC
  Filled 2018-07-24: qty 3

## 2018-07-24 MED ORDER — AZITHROMYCIN 500 MG IV SOLR
INTRAVENOUS | Status: AC
  Filled 2018-07-24: qty 500

## 2018-07-24 MED ORDER — IPRATROPIUM-ALBUTEROL 0.5-2.5 (3) MG/3ML IN SOLN
3.0000 mL | Freq: Four times a day (QID) | RESPIRATORY_TRACT | Status: DC
  Administered 2018-07-24: 3 mL via RESPIRATORY_TRACT
  Filled 2018-07-24: qty 3

## 2018-07-24 MED ORDER — IPRATROPIUM-ALBUTEROL 0.5-2.5 (3) MG/3ML IN SOLN
3.0000 mL | Freq: Once | RESPIRATORY_TRACT | Status: DC
  Filled 2018-07-24: qty 3

## 2018-07-24 MED ORDER — SODIUM CHLORIDE 0.9 % IV SOLN
1.0000 g | Freq: Once | INTRAVENOUS | Status: AC
  Administered 2018-07-24: 1 g via INTRAVENOUS
  Filled 2018-07-24: qty 10

## 2018-07-24 MED ORDER — HYDROCODONE-HOMATROPINE 5-1.5 MG/5ML PO SYRP: 5.0000 mL | ORAL_SOLUTION | Freq: Four times a day (QID) | ORAL | 0 refills | Status: DC | PRN

## 2018-07-24 NOTE — ED Notes (Signed)
Pt given crackers and gingerale.

## 2018-07-24 NOTE — ED Provider Notes (Signed)
[facility] EMERGENCY DEPARTMENT Provider Note   CSN: [id number] Arrival date & time: 07/24/18  1054     History   Chief Complaint Chief Complaint  Patient presents with  . Shortness of Breath    HPI Brett Berry is a 67 y.o. male.  HPI Patient has developed cough and shortness of breath starting 3 days prior to presentation.  He reports he has been coughing a lot.  Cough has been productive.  Chest feels congested.  No sharp chest pain.  No nausea no vomiting.  No lower extremity swelling or calf pain. Past Medical History:  Diagnosis Date  . Allergic rhinitis   . Asthma   . Bilateral knee pain 2016   x-ray showed medial compartment arthritis, worse on right than left  . Erectile dysfunction 07/30/2014  . Hearing difficulty    has aids  . Hyperlipidemia    intolerant to zocor  . Sinusitis, chronic     Patient Active Problem List   Diagnosis Date Noted  . CAP (community acquired pneumonia) 08/19/2016  . GERD with esophagitis 08/19/2016  . PCP NOTES >>>>>>>>>>>>>>>>>> 02/18/2016  . DJD (knees, celebrex, ortho Dr Sharol Given) 01/08/2015  . Erectile dysfunction 07/30/2014  . Annual physical exam 12/29/2013  . Non-functioning tympanostomy tube 09/10/2011  . Dysphagia 09/10/2011  . Chronic sinusitis 02/05/2010  . HEADACHE 02/05/2010  . Anxiety -insomnia 01/04/2009  . Hyperlipidemia 01/04/2009  . ALLERGIC RHINITIS 10/27/2007  . Asthma 10/27/2007    Past Surgical History:  Procedure Laterality Date  . CATARACT EXTRACTION Right 2016  . MASTOIDECTOMY     right   . right hand surg     for infection in '07  . SINUS ENDO WITH FUSION          Home Medications    Prior to Admission medications   Medication Sig Start Date End Date Taking? Authorizing Provider  acetaminophen (TYLENOL) 500 MG tablet Take 1,000-1,500 mg by mouth every 3 (three) hours as needed for mild pain, moderate pain, fever or headache.    [provider]  albuterol (PROVENTIL)  (2.5 MG/3ML) 0.083% nebulizer solution Take 3 mLs (2.5 mg total) by nebulization every 4 (four) hours as needed for wheezing or shortness of breath. 08/22/16   Hosie Poisson, MD  azithromycin (ZITHROMAX) 250 MG tablet Take 1 tablet (250 mg total) by mouth daily. 07/24/18   Charlesetta Shanks, MD  budesonide-formoterol (SYMBICORT) 160-4.5 MCG/ACT inhaler Inhale 2 puffs into the lungs 2 (two) times daily. 03/02/18   Colon Branch, MD  celecoxib (CELEBREX) 100 MG capsule Take 1 capsule (100 mg total) by mouth 2 (two) times daily as needed for moderate pain. 03/02/18   Colon Branch, MD  esomeprazole (NEXIUM) 40 MG capsule Take 1 capsule (40 mg total) by mouth 2 (two) times daily before a meal. 03/02/18   Paz, [name], MD  fluticasone [facility]) 50 MCG/ACT nasal spray Place 2 sprays into the nose daily.      [provider]  HYDROcodone-homatropine (HYCODAN) 5-1.5 MG/5ML syrup Take 5 mLs by mouth every 6 (six) hours as needed for cough. 07/24/18   Charlesetta Shanks, MD  levalbuterol [facility] HFA) 45 MCG/ACT inhaler Inhale 1-2 puffs into the lungs every 4 (four) hours as needed for wheezing or shortness of breath. 03/02/18   Colon Branch, MD  Multiple Vitamin (MULTIVITAMIN WITH MINERALS) TABS tablet Take 1 tablet by mouth daily.    [provider]  Omega-3 Fatty Acids (FISH OIL) 1000 MG CAPS  Take 1 capsule by mouth 2 (two) times daily.    [provider]    Family History Family History  Problem Relation Age of Onset  . COPD Mother   . Breast cancer Mother   . Alzheimer's disease Father   . Lung cancer Father        smoker  . Prostate cancer Father        died age 63  . Heart disease Father        age 83  . Colon cancer Neg Hx     Social History Social History   Tobacco Use  . Smoking status: Former Smoker    Last attempt to quit: 09/08/1973    Years since quitting: 44.9  . Smokeless tobacco: Never Used  Substance Use Topics  . Alcohol use: Yes    Comment: 2-3 times week  .  Drug use: No     Allergies   Patient has no known allergies.   Review of Systems Review of Systems 10 Systems reviewed and are negative for acute change except as noted in the HPI.   Physical Exam Updated Vital Signs BP (!) 143/78   Pulse 73   Temp 99.7 F (37.6 C) (Oral)   Resp 20   Ht 5\' 10"  (1.778 m)   Wt 87.5 kg   SpO2 96%   BMI 27.69 kg/m   Physical Exam Constitutional:      Appearance: Normal appearance.  HENT:     Head: Normocephalic and atraumatic.     Mouth/Throat:     Mouth: Mucous membranes are moist.  Eyes:     Extraocular Movements: Extraocular movements intact.  Neck:     Musculoskeletal: Neck supple.  Cardiovascular:     Rate and Rhythm: Normal rate and regular rhythm.  Pulmonary:     Effort: Pulmonary effort is normal.     Breath sounds: Normal breath sounds.  Abdominal:     General: There is no distension.     Palpations: Abdomen is soft.     Tenderness: There is no abdominal tenderness. There is no guarding.  Musculoskeletal: Normal range of motion.        General: No tenderness.     Right lower leg: No edema.     Left lower leg: No edema.  Skin:    General: Skin is warm and dry.  Neurological:     General: No focal deficit present.     Mental Status: He is alert and oriented to person, place, and time.     Coordination: Coordination normal.      ED Treatments / Results  Labs (all labs ordered are listed, but only abnormal results are displayed) Labs Reviewed  CBC WITH DIFFERENTIAL/PLATELET - Abnormal; Notable for the following components:      Result Value   WBC 14.3 (*)    RBC 4.10 (*)    Neutro Abs 11.1 (*)    Abs Immature Granulocytes 0.09 (*)    All other components within normal limits  BASIC METABOLIC PANEL - Abnormal; Notable for the following components:   Potassium 3.2 (*)    Glucose, Bld 166 (*)    Creatinine, Ser 1.34 (*)    Calcium 8.7 (*)    GFR calc non Af Amer 55 (*)    All other components within normal  limits  TROPONIN I    EKG EKG Interpretation  Date/Time:  Sunday July 24 2018 11:13:11 EST Ventricular Rate:  71 PR Interval:    QRS  Duration: 93 QT Interval:  370 QTC Calculation: 402 R Axis:   -16 Text Interpretation:  Sinus rhythm Borderline left axis deviation normal. no change from previous Confirmed by Charlesetta Shanks [phone]) on 07/24/2018 11:15:35 AM Also confirmed by Charlesetta Shanks [phone]), editor Philomena Doheny [phone])  on 07/24/2018 12:28:42 PM   Radiology No results found.  Procedures Procedures (including critical care time)  Medications Ordered in ED Medications  cefTRIAXone (ROCEPHIN) 1 g in sodium chloride 0.9 % 100 mL IVPB (0 g Intravenous Stopped 07/24/18 1334)  azithromycin (ZITHROMAX) 500 mg in sodium chloride 0.9 % 250 mL IVPB (0 mg Intravenous Stopped 07/24/18 1441)     Initial Impression / Assessment and Plan / ED Course  I have reviewed the triage vital signs and the nursing notes.  Pertinent labs & imaging results that were available during my care of the patient were reviewed by me and considered in my medical decision making (see chart for details).    Patient has symptoms of chest congestion with productive cough.  X-ray does show area suspicious for left upper lobe pneumonia.  Patient is clinically stable without hypoxia, dehydration, mental status change.  At this time I feel he is stable to initiate outpatient treatment.  Zithromax and Rocephin started in the emergency department.  Return precautions reviewed.  Final Clinical Impressions(s) / ED Diagnoses   Final diagnoses:  Community acquired pneumonia of left upper lobe of lung [facility])    ED Discharge Orders         Ordered    azithromycin (ZITHROMAX) 250 MG tablet  Daily     07/24/18 1438    HYDROcodone-homatropine (HYCODAN) 5-1.5 MG/5ML syrup  Every 6 hours PRN     07/24/18 1438           Charlesetta Shanks, MD 07/26/18 1405

## 2018-07-24 NOTE — ED Triage Notes (Addendum)
Patient reports shortness of breath and chest congestion since Thursday night. Alert and oriented, ambulatory to triage in NAD.  Took ibuprofen at 0400 this morning.

## 2018-07-25 ENCOUNTER — Telehealth: Payer: Self-pay | Admitting: Internal Medicine

## 2018-07-25 NOTE — Telephone Encounter (Signed)
Was diagnosed with pneumonia at the emergency room, needs a follow-up in 7 to 10 days. Please call the patient, set up a follow-up, advised him that he needs to be seen sooner if he is not improving.

## 2018-07-26 NOTE — Telephone Encounter (Signed)
Appt scheduled 07/29/2018.

## 2018-07-29 ENCOUNTER — Encounter: Payer: Self-pay | Admitting: Internal Medicine

## 2018-07-29 ENCOUNTER — Ambulatory Visit (INDEPENDENT_AMBULATORY_CARE_PROVIDER_SITE_OTHER): Payer: Medicare Other | Admitting: Internal Medicine

## 2018-07-29 VITALS — BP 119/69 | HR 60 | Temp 98.1°F | Resp 16 | Ht 70.0 in | Wt 190.0 lb

## 2018-07-29 DIAGNOSIS — J189 Pneumonia, unspecified organism: Secondary | ICD-10-CM | POA: Diagnosis not present

## 2018-07-29 LAB — CBC WITH DIFFERENTIAL/PLATELET
Basophils Absolute: 0.1 10*3/uL (ref 0.0–0.1)
Basophils Relative: 1.2 % (ref 0.0–3.0)
Eosinophils Absolute: 1.1 10*3/uL — ABNORMAL HIGH (ref 0.0–0.7)
Eosinophils Relative: 14.4 % — ABNORMAL HIGH (ref 0.0–5.0)
HCT: 40.9 % (ref 39.0–52.0)
HEMOGLOBIN: 13.9 g/dL (ref 13.0–17.0)
Lymphocytes Relative: 36.3 % (ref 12.0–46.0)
Lymphs Abs: 2.8 10*3/uL (ref 0.7–4.0)
MCHC: 33.9 g/dL (ref 30.0–36.0)
MCV: 96.1 fl (ref 78.0–100.0)
Monocytes Absolute: 0.6 10*3/uL (ref 0.1–1.0)
Monocytes Relative: 7.9 % (ref 3.0–12.0)
Neutro Abs: 3.1 10*3/uL (ref 1.4–7.7)
Neutrophils Relative %: 40.2 % — ABNORMAL LOW (ref 43.0–77.0)
Platelets: 360 10*3/uL (ref 150.0–400.0)
RBC: 4.26 Mil/uL (ref 4.22–5.81)
RDW: 13.1 % (ref 11.5–15.5)
WBC: 7.7 10*3/uL (ref 4.0–10.5)

## 2018-07-29 LAB — BASIC METABOLIC PANEL
BUN: 23 mg/dL (ref 6–23)
CHLORIDE: 103 meq/L (ref 96–112)
CO2: 28 mEq/L (ref 19–32)
Calcium: 9.3 mg/dL (ref 8.4–10.5)
Creatinine, Ser: 1.11 mg/dL (ref 0.40–1.50)
GFR: 70.51 mL/min (ref >60.00)
Glucose, Bld: 67 mg/dL — ABNORMAL LOW (ref 70–99)
Potassium: 4.8 mEq/L (ref 3.5–5.1)
Sodium: 139 mEq/L (ref 135–145)

## 2018-07-29 MED ORDER — HYDROCODONE-HOMATROPINE 5-1.5 MG/5ML PO SYRP: 5.0000 mL | ORAL_SOLUTION | Freq: Every evening | ORAL | 0 refills | Status: DC | PRN

## 2018-07-29 MED ORDER — CEFUROXIME AXETIL 500 MG PO TABS: 500.0000 mg | ORAL_TABLET | Freq: Two times a day (BID) | ORAL | 0 refills | Status: DC

## 2018-07-29 NOTE — Patient Instructions (Signed)
GO TO THE LAB : Get the blood work     Please come back in 1 month, go to the first floor and get a follow-up chest x-ray for pneumonia  Continue with cough medication and inhalers as needed Hydrocodone for nocturnal cough.  Will cause drowsiness  Lots of fluids  Tylenol if needed  Ceftin, an antibiotic for 1 additional week  If you are not gradually better daily no

## 2018-07-29 NOTE — Progress Notes (Signed)
Subjective:    Patient ID: Brett Berry, male    DOB: 07-18-1951, 67 y.o.   MRN: [id number]  DOS:  07/29/2018 Type of visit - description: ER follow-up  Patient went to the ER 07/24/2018, he had a 3-day history of cough and difficulty breathing. Temperature was 99.7, O2 sat 96%. Chest x-ray was suspicious for a left upper lobe pneumonia.   Potassium 3.2.  Troponin negative. WBCs 14.3. Was prescribed Zithromax and Rocephin. I advised him to follow-up here.  Review of Systems Since he left the ER, he finished Zithromax as recommended. He is still weak but has no fevers. Cough has improved, still coughing up green thick sputum. No nausea, vomiting, diarrhea Has a history of asthma but currently symptoms are controlled. All symptoms started with sinus congestion and that is getting slightly better.  Past Medical History:  Diagnosis Date  . Allergic rhinitis   . Asthma   . Bilateral knee pain 2016   x-ray showed medial compartment arthritis, worse on right than left  . Erectile dysfunction 07/30/2014  . Hearing difficulty    has aids  . Hyperlipidemia    intolerant to zocor  . Sinusitis, chronic     Past Surgical History:  Procedure Laterality Date  . CATARACT EXTRACTION Right 2016  . MASTOIDECTOMY     right   . right hand surg     for infection in '07  . SINUS ENDO WITH FUSION      Social History   Socioeconomic History  . Marital status: Single    Spouse name: Not on file  . Number of children: 1  . Years of education: Not on file  . Highest education level: Not on file  Occupational History  . Occupation: Environmental education officer: [employer]  . Financial resource strain: Not on file  . Food insecurity:    Worry: Not on file    Inability: Not on file  . Transportation needs:    Medical: Not on file    Non-medical: Not on file  Tobacco Use  . Smoking status: Former Smoker    Last attempt to quit: 09/08/1973    Years  since quitting: 44.9  . Smokeless tobacco: Never Used  Substance and Sexual Activity  . Alcohol use: Yes    Comment: 2-3 times week  . Drug use: No  . Sexual activity: Yes    Partners: Female  Lifestyle  . Physical activity:    Days per week: Not on file    Minutes per session: Not on file  . Stress: Not on file  Relationships  . Social connections:    Talks on phone: Not on file    Gets together: Not on file    Attends religious service: Not on file    Active member of club or organization: Not on file    Attends meetings of clubs or organizations: Not on file    Relationship status: Not on file  . Intimate partner violence:    Fear of current or ex partner: Not on file    Emotionally abused: Not on file    Physically abused: Not on file    Forced sexual activity: Not on file  Other Topics Concern  . Not on file  Social History Narrative   HSG. Married '76- 6 years divorced ; '85. 1 son- '86.    Lost wife 12-2014      Allergies as of 07/29/2018  No Known Allergies     Medication List       Accurate as of July 29, 2018 11:59 PM. Always use your most recent med list.        albuterol (2.5 MG/3ML) 0.083% nebulizer solution Commonly known as:  PROVENTIL Take 3 mLs (2.5 mg total) by nebulization every 4 (four) hours as needed for wheezing or shortness of breath.   budesonide-formoterol 160-4.5 MCG/ACT inhaler Commonly known as:  SYMBICORT Inhale 2 puffs into the lungs 2 (two) times daily.   cefUROXime 500 MG tablet Commonly known as:  CEFTIN Take 1 tablet (500 mg total) by mouth 2 (two) times daily with a meal.   celecoxib 100 MG capsule Commonly known as:  CELEBREX Take 1 capsule (100 mg total) by mouth 2 (two) times daily as needed for moderate pain.   Co Q 10 10 MG Caps Take by mouth.   esomeprazole 40 MG capsule Commonly known as:  NEXIUM Take 1 capsule (40 mg total) by mouth 2 (two) times daily before a meal.   Fish Oil 1000 MG Caps Take 1  capsule by mouth 2 (two) times daily.   fluticasone 50 MCG/ACT nasal spray Commonly known as:  FLONASE Place 2 sprays into the nose daily.   HYDROcodone-homatropine 5-1.5 MG/5ML syrup Commonly known as:  HYCODAN Take 5 mLs by mouth at bedtime as needed for cough.   levalbuterol 45 MCG/ACT inhaler Commonly known as:  XOPENEX HFA Inhale 1-2 puffs into the lungs every 4 (four) hours as needed for wheezing or shortness of breath.   multivitamin with minerals Tabs tablet Take 1 tablet by mouth daily.           Objective:   Physical Exam BP 119/69 (BP Location: Left Arm, Patient Position: Sitting, Cuff Size: Small)   Pulse 60   Temp 98.1 F (36.7 C) (Oral)   Resp 16   Ht 5\' 10"  (1.778 m)   Wt 190 lb (86.2 kg)   SpO2 99%   BMI 27.26 kg/m  General:   Well developed, NAD, BMI noted. HEENT:  Normocephalic . Face symmetric, atraumatic.  Nose is slightly congested Lungs:  CTA B Normal respiratory effort, no intercostal retractions, no accessory muscle use. Heart: RRR,  no murmur.  No pretibial edema bilaterally  Skin: Not pale. Not jaundice Neurologic:  alert & oriented X3.  Speech normal, gait appropriate for age and unassisted Psych--  Cognition and judgment appear intact.  Cooperative with normal attention span and concentration.  Behavior appropriate. No anxious or depressed appearing.      Assessment     Assessment Hyperlipidemia ---intolerant to simvastatin Asthma GERD E.D. Chronic sinusitis, Rhinitis (ENT Dr Lucia Gaskins) DJD- knees R>>L, wrists  , Dr Sharol Given [facility] has aids  PLAN: Community-acquired pneumonia: Status post Rocephin x1, Z-Pak.  Feeling better, still coughing green sputum. Plan: Additional antibiotic, Ceftin for 1 week CBC, BMP Chest x-ray in 1 month Call if not gradually better Hydrocodone prescription sent for control of nocturnal cough.  See AVS.

## 2018-08-01 NOTE — Assessment & Plan Note (Signed)
  Community-acquired pneumonia: Status post Rocephin x1, Z-Pak.  Feeling better, still coughing green sputum. Plan: Additional antibiotic, Ceftin for 1 week CBC, BMP Chest x-ray in 1 month Call if not gradually better Hydrocodone prescription sent for control of nocturnal cough.  See AVS.

## 2018-08-12 ENCOUNTER — Telehealth (INDEPENDENT_AMBULATORY_CARE_PROVIDER_SITE_OTHER): Payer: Self-pay | Admitting: Orthopedic Surgery

## 2018-08-12 NOTE — Telephone Encounter (Signed)
Please see below.

## 2018-08-12 NOTE — Telephone Encounter (Signed)
Patient called stating he's ready to schedule his bilateral total knee surgery and would like to do that as soon as possible. He asked what dates are available and I've offered Feb 4th and 6th as a possibility.  If agreeable please provide surgery sheet.

## 2018-08-15 NOTE — Telephone Encounter (Signed)
Blue sheet done.  Please have Debbie call him.  Thanks

## 2018-08-15 NOTE — Telephone Encounter (Signed)
Blue sheet complete

## 2018-08-16 NOTE — Telephone Encounter (Signed)
Patient is scheduled for bilateral knee arthroplasty 08-30-18 @ 10:54am [facility].

## 2018-08-17 DIAGNOSIS — L922 Granuloma faciale [eosinophilic granuloma of skin]: Secondary | ICD-10-CM | POA: Diagnosis not present

## 2018-08-17 NOTE — Telephone Encounter (Signed)
fyi

## 2018-08-17 NOTE — Telephone Encounter (Signed)
Ok thx.

## 2018-08-19 ENCOUNTER — Other Ambulatory Visit (INDEPENDENT_AMBULATORY_CARE_PROVIDER_SITE_OTHER): Payer: Self-pay | Admitting: Orthopedic Surgery

## 2018-08-19 DIAGNOSIS — M1711 Unilateral primary osteoarthritis, right knee: Secondary | ICD-10-CM

## 2018-08-19 DIAGNOSIS — M1712 Unilateral primary osteoarthritis, left knee: Secondary | ICD-10-CM

## 2018-08-23 NOTE — Pre-Procedure Instructions (Signed)
Asuncion Shibata Hooton  08/23/2018      [id number] [address], [place] [address] [place] [address] Phone: [phone] Fax: [phone]    Your procedure is scheduled on January 28th.  Report to [facility] Admitting at 9:00 A.M.  Call this number if you have problems the morning of surgery:  [phone]   Remember:  Do not eat or drink after midnight.     Take these medicines the morning of surgery with A SIP OF WATER  Albuterol Nebulizer - if needed  Symbicort Inhaler - if needed  Celebrex  Durezol eye drops  Nexium - if needed  Nasal Spray - if needed  Levalbuterol Inhaler - if needed  7 days prior to surgery STOP taking any Aspirin (unless otherwise instructed by your surgeon), Aleve, Naproxen, Ibuprofen, Motrin, Advil, Goody's, BC's, all herbal medications, fish oil, and all vitamins.     Do not wear jewelry.  Do not wear lotions, powders, colognes, or deodorant.  Men may shave face and neck.  Do not bring valuables to the hospital.  [facility] is not responsible for any belongings or valuables.   [place]- Preparing For Surgery  Before surgery, you can play an important role. Because skin is not sterile, your skin needs to be as free of germs as possible. You can reduce the number of germs on your skin by washing with CHG (chlorahexidine gluconate) Soap before surgery.  CHG is an antiseptic cleaner which kills germs and bonds with the skin to continue killing germs even after washing.    Oral Hygiene is also important to reduce your risk of infection.  Remember - BRUSH YOUR TEETH THE MORNING OF SURGERY WITH YOUR REGULAR TOOTHPASTE  Please do not use if you have an allergy to CHG or antibacterial soaps. If your skin becomes reddened/irritated stop using the CHG.  Do not shave (including legs and underarms) for at least 48 hours prior to first CHG shower. It is OK to shave your face.  Please follow these  instructions carefully.   1. Shower the NIGHT BEFORE SURGERY and the MORNING OF SURGERY with CHG.   2. If you chose to wash your hair, wash your hair first as usual with your normal shampoo.  3. After you shampoo, rinse your hair and body thoroughly to remove the shampoo.  4. Use CHG as you would any other liquid soap. You can apply CHG directly to the skin and wash gently with a scrungie or a clean washcloth.   5. Apply the CHG Soap to your body ONLY FROM THE NECK DOWN.  Do not use on open wounds or open sores. Avoid contact with your eyes, ears, mouth and genitals (private parts). Wash Face and genitals (private parts)  with your normal soap.  6. Wash thoroughly, paying special attention to the area where your surgery will be performed.  7. Thoroughly rinse your body with warm water from the neck down.  8. DO NOT shower/wash with your normal soap after using and rinsing off the CHG Soap.  9. Pat yourself dry with a CLEAN TOWEL.  10. Wear CLEAN PAJAMAS to bed the night before surgery, wear comfortable clothes the morning of surgery  11. Place CLEAN SHEETS on your bed the night of your first shower and DO NOT SLEEP WITH PETS.   Day of Surgery:  Do not apply any deodorants/lotions.  Please wear clean clothes to the hospital/surgery center.  Remember to brush your teeth WITH YOUR REGULAR TOOTHPASTE.   Contacts, dentures or bridgework may not be worn into surgery.  Leave your suitcase in the car.  After surgery it may be brought to your room.  For patients admitted to the hospital, discharge time will be determined by your treatment team.  Patients discharged the day of surgery will not be allowed to drive home.   Please read over the following fact sheets that you were given. Coughing and Deep Breathing, MRSA Information and Surgical Site Infection Prevention

## 2018-08-23 NOTE — Progress Notes (Signed)
Pt had chest xray 07-24-18 suggesting pneumonia.  Instructed to follow up with chest xray in 1 month according to records.  Contacted Ebony Hail and notified of findings.  Ebony Hail suggested calling Myer Haff at [facility] office for further instructions.    Called [place] @ Dr. Randel Pigg office, left voicemail asking for further instructions on chest xray. 08-23-18 @ 1624.

## 2018-08-24 ENCOUNTER — Encounter (HOSPITAL_COMMUNITY)
Admission: RE | Admit: 2018-08-24 | Discharge: 2018-08-24 | Disposition: A | Discharge status: Home / Self Care | Payer: Medicare Other | Source: Ambulatory Visit | Attending: Orthopedic Surgery | Admitting: Orthopedic Surgery

## 2018-08-24 ENCOUNTER — Other Ambulatory Visit: Payer: Self-pay

## 2018-08-24 ENCOUNTER — Encounter (HOSPITAL_COMMUNITY): Payer: Self-pay

## 2018-08-24 ENCOUNTER — Telehealth (INDEPENDENT_AMBULATORY_CARE_PROVIDER_SITE_OTHER): Payer: Self-pay | Admitting: Orthopedic Surgery

## 2018-08-24 DIAGNOSIS — Z01812 Encounter for preprocedural laboratory examination: Secondary | ICD-10-CM | POA: Diagnosis not present

## 2018-08-24 HISTORY — DX: Gastro-esophageal reflux disease without esophagitis: K21.9

## 2018-08-24 HISTORY — DX: Personal history of urinary calculi: Z87.442

## 2018-08-24 HISTORY — DX: Pneumonia, unspecified organism: J18.9

## 2018-08-24 LAB — BASIC METABOLIC PANEL
Anion gap: 9 (ref 5–15)
BUN: 16 mg/dL (ref 8–23)
CO2: 24 mmol/L (ref 22–32)
Calcium: 9 mg/dL (ref 8.9–10.3)
Chloride: 106 mmol/L (ref 98–111)
Creatinine, Ser: 1.06 mg/dL (ref 0.61–1.24)
GFR calc Af Amer: >60 mL/min (ref >60)
GFR calc non Af Amer: >60 mL/min (ref >60)
GLUCOSE: 98 mg/dL (ref 70–99)
Potassium: 4.2 mmol/L (ref 3.5–5.1)
Sodium: 139 mmol/L (ref 135–145)

## 2018-08-24 LAB — URINALYSIS, ROUTINE W REFLEX MICROSCOPIC
Bilirubin Urine: NEGATIVE
GLUCOSE, UA: NEGATIVE mg/dL
Hgb urine dipstick: NEGATIVE
Ketones, ur: NEGATIVE mg/dL
Leukocytes, UA: NEGATIVE
Nitrite: NEGATIVE
Protein, ur: NEGATIVE mg/dL
SPECIFIC GRAVITY, URINE: 1.023 (ref 1.005–1.030)
pH: 5 (ref 5.0–8.0)

## 2018-08-24 LAB — CBC
HCT: 40.1 % (ref 39.0–52.0)
Hemoglobin: 13.2 g/dL (ref 13.0–17.0)
MCH: 31.5 pg (ref 26.0–34.0)
MCHC: 32.9 g/dL (ref 30.0–36.0)
MCV: 95.7 fL (ref 80.0–100.0)
Platelets: 213 10*3/uL (ref 150–400)
RBC: 4.19 MIL/uL — ABNORMAL LOW (ref 4.22–5.81)
RDW: 13.1 % (ref 11.5–15.5)
WBC: 8.9 10*3/uL (ref 4.0–10.5)
nRBC: 0 % (ref 0.0–0.2)

## 2018-08-24 LAB — TYPE AND SCREEN
ABO/RH(D): A POS
Antibody Screen: NEGATIVE

## 2018-08-24 LAB — SURGICAL PCR SCREEN
MRSA, PCR: POSITIVE — AB
Staphylococcus aureus: POSITIVE — AB

## 2018-08-24 LAB — ABO/RH: ABO/RH(D): A POS

## 2018-08-24 NOTE — Telephone Encounter (Signed)
Patient patient needs chest x-ray this week to make sure pneumonia is not getting worse

## 2018-08-24 NOTE — Progress Notes (Signed)
Surgical PCR +MSSA and MRSA. [facility] Left voicemail for patient to pick up prescription.

## 2018-08-24 NOTE — Telephone Encounter (Signed)
Message left on voice mail today from Minna Merritts at [phone] that patient is scheduled for Bilateral Total Knee Arthroplasty 08-30-18. Patient had pre-op today.   Chest xray the day of surgery?   See the following comment:   Pt had chest xray 07-24-18 suggesting pneumonia.  Instructed to follow up with chest xray in 1 month according to records.  Contacted Ebony Hail and notified of findings.  Ebony Hail suggested calling Myer Haff at [facility] office for further instructions.    Called [place] @ Dr. Randel Pigg office, left voicemail asking for further instructions on chest xray. 08-23-18 @ 1624.

## 2018-08-24 NOTE — Telephone Encounter (Signed)
Please see message below and advise.

## 2018-08-24 NOTE — Progress Notes (Signed)
PCP - Dr. Kathlene November  Cardiologist - Denies  Chest x-ray - 07/24/18 (E)- showed possible pnuemonia. Pt treated w/abx, no symptoms at PAT. Called Dr. Randel Pigg office to inquire about obtaining another scan. No answer. Spoke with Ebony Hail, if pt has no symptoms, no new scan needed.  EKG - 07/26/18 (E)  Stress Test - 2005 (E)  ECHO - Denies  Cardiac Cath - Denies  AICD- na PM- na LOOP- na  Sleep Study - Denies CPAP - None  LABS- 08/24/2018: CBC, BMP, T/S, UA, UC, PCR  ASA- Denies   Anesthesia- No  Pt denies having chest pain, sob, or fever at this time. All instructions explained to the pt, with a verbal understanding of the material. Pt agrees to go over the instructions while at home for a better understanding. The opportunity to ask questions was provided.

## 2018-08-24 NOTE — Progress Notes (Signed)
Brett Berry            08/23/2018                          [id number] [address], [place] [address] [place] [address] Phone: [phone] Fax: [phone]              Your procedure is scheduled on January 28th.            Report to [facility] Admitting at 9:00 A.M.            Call this number if you have problems the morning of surgery:            [phone]             Remember:            Do not eat or drink after midnight on Jan. 27th                                   Take these medicines the morning of surgery with A SIP OF WATER: Esomeprazole (NEXIUM), Budesonide-formoterol (SYMBICORT), Difluprednate (DUREZOL), and Fluticasone (FLONASE)  If needed: Albuterol (PROVENTIL)l and Evalbuterol (XOPENEX HFA)- bring both with you the day of surgery               As of today, taking any Aspirin, all NSAIDS: Aleve, Naproxen, Ibuprofen, Motrin, Advil, Goody's, BC's, all herbal medications, fish oil, and all vitamins. Including: Celecoxib (CELEBREX)              Do not wear jewelry.            Do not wear lotions, powders, colognes, or deodorant.            Men may shave face and neck.            Do not bring valuables to the hospital.            [facility] is not responsible for any belongings or valuables.   [place]- Preparing For Surgery  Before surgery, you can play an important role. Because skin is not sterile, your skin needs to be as free of germs as possible. You can reduce the number of germs on your skin by washing with CHG (chlorahexidine gluconate) Soap before surgery.  CHG is an antiseptic cleaner which kills germs and bonds with the skin to continue killing germs even after washing.    Oral Hygiene is also important to reduce your risk of infection.  Remember - BRUSH YOUR TEETH THE MORNING OF SURGERY WITH YOUR REGULAR TOOTHPASTE  Please do not use if you have an allergy to CHG or antibacterial  soaps. If your skin becomes reddened/irritated stop using the CHG.  Do not shave (including legs and underarms) for at least 48 hours prior to first CHG shower. It is OK to shave your face.  Please follow these instructions carefully.  1. Shower the NIGHT BEFORE SURGERY and the MORNING OF SURGERY with CHG.   2. If you chose to wash your hair, wash your hair first as usual with your normal shampoo.  3. After you shampoo, rinse your hair and body thoroughly to remove the shampoo.  4. Use CHG as you would any other liquid soap. You can apply CHG directly to the skin and wash gently with a scrungie or a clean washcloth.   5. Apply the CHG Soap to your body ONLY FROM THE NECK DOWN.  Do not use on open wounds or open sores. Avoid contact with your eyes, ears, mouth and genitals (private parts). Wash Face and genitals (private parts)  with your normal soap.  6. Wash thoroughly, paying special attention to the area where your surgery will be performed.  7. Thoroughly rinse your body with warm water from the neck down.  8. DO NOT shower/wash with your normal soap after using and rinsing off the CHG Soap.  9. Pat yourself dry with a CLEAN TOWEL.  10. Wear CLEAN PAJAMAS to bed the night before surgery, wear comfortable clothes the morning of surgery  11. Place CLEAN SHEETS on your bed the night of your first shower and DO NOT SLEEP WITH PETS.   Day of Surgery:  Do not apply any deodorants/lotions.  Please wear clean clothes to the hospital/surgery center.   Remember to brush your teeth WITH YOUR REGULAR TOOTHPASTE.   Contacts, dentures or bridgework may not be worn into surgery.  Leave your suitcase in the car.  After surgery it may be brought to your room.  For patients admitted to the hospital, discharge time will be determined by your treatment  team.  Patients discharged the day of surgery will not be allowed to drive home.   Please read over the following fact sheets that you were given. Coughing and Deep Breathing, MRSA Information and Surgical Site Infection Prevention

## 2018-08-25 ENCOUNTER — Ambulatory Visit (INDEPENDENT_AMBULATORY_CARE_PROVIDER_SITE_OTHER): Payer: Medicare Other | Admitting: Family Medicine

## 2018-08-25 ENCOUNTER — Encounter (INDEPENDENT_AMBULATORY_CARE_PROVIDER_SITE_OTHER): Payer: Self-pay | Admitting: Family Medicine

## 2018-08-25 ENCOUNTER — Ambulatory Visit (INDEPENDENT_AMBULATORY_CARE_PROVIDER_SITE_OTHER): Payer: Medicare Other

## 2018-08-25 DIAGNOSIS — Z8701 Personal history of pneumonia (recurrent): Secondary | ICD-10-CM | POA: Diagnosis not present

## 2018-08-25 DIAGNOSIS — Z01818 Encounter for other preprocedural examination: Secondary | ICD-10-CM

## 2018-08-25 NOTE — Progress Notes (Signed)
Please call patient with results. Thanks - pls change pre op abx to vanco thx

## 2018-08-25 NOTE — Progress Notes (Signed)
Subjective: He is here for preoperative chest x-ray.  He had pneumonia treated last month, he is now asymptomatic.  Objective: Lungs are clear to auscultation throughout.  Chest x-ray: Lungs are clear, no residual pneumonia compared to x-rays from December.  Impression: Resolved pneumonia  Plan: Proceed with knee replacement as scheduled.

## 2018-08-25 NOTE — Telephone Encounter (Signed)
Pt called back and is on his way to the office for CXR with Dr. Junius Roads. Will hold message and relay results to Dr. Marlou Sa tomorrow.

## 2018-08-25 NOTE — Telephone Encounter (Signed)
Called and sw Ailene Ravel and she advised because the pt has already had pre op appt that this pt would have to have an order for radiology for chest x-ray they could not bring him back in for this. I called and lm on vm for pt to call the office to see if he can come this morning for a chest x ray with Dr. Junius Roads and he can read this prior to surgery. I lm on vm will hold this message and continue to try and reach the pt.

## 2018-08-26 LAB — URINE CULTURE: Culture: NO GROWTH

## 2018-08-26 NOTE — Telephone Encounter (Signed)
This pt is sch for bilat total knees Monday. Needed CXR to follow up on PNA. Came in yesterday for this with Dr. Junius Roads. FYI

## 2018-08-26 NOTE — Telephone Encounter (Signed)
thx

## 2018-08-29 MED ORDER — CEFAZOLIN SODIUM-DEXTROSE 2-4 GM/100ML-% IV SOLN
2.0000 g | INTRAVENOUS | Status: AC
  Administered 2018-08-30: 2 g via INTRAVENOUS
  Filled 2018-08-29: qty 100

## 2018-08-29 MED ORDER — TRANEXAMIC ACID-NACL 1000-0.7 MG/100ML-% IV SOLN
1000.0000 mg | INTRAVENOUS | Status: AC
  Administered 2018-08-30: 1000 mg via INTRAVENOUS
  Filled 2018-08-29: qty 100

## 2018-08-29 MED ORDER — VANCOMYCIN HCL IN DEXTROSE 1-5 GM/200ML-% IV SOLN
1000.0000 mg | INTRAVENOUS | Status: AC
  Administered 2018-08-30: 1000 mg via INTRAVENOUS
  Filled 2018-08-29: qty 200

## 2018-08-30 ENCOUNTER — Inpatient Hospital Stay (HOSPITAL_COMMUNITY): Payer: Medicare Other | Admitting: Anesthesiology

## 2018-08-30 ENCOUNTER — Encounter (HOSPITAL_COMMUNITY): Payer: Self-pay

## 2018-08-30 ENCOUNTER — Inpatient Hospital Stay (HOSPITAL_COMMUNITY)
Admission: RE | Admit: 2018-08-30 | Discharge: 2018-09-03 | DRG: 462 | Disposition: A | Discharge status: Home Health | Payer: Medicare Other | Attending: Orthopedic Surgery | Admitting: Orthopedic Surgery

## 2018-08-30 ENCOUNTER — Inpatient Hospital Stay (HOSPITAL_COMMUNITY): Payer: Medicare Other | Admitting: Vascular Surgery

## 2018-08-30 ENCOUNTER — Encounter (HOSPITAL_COMMUNITY)
Admission: RE | Disposition: A | Discharge status: Home Health | Payer: Self-pay | Source: Home / Self Care | Attending: Orthopedic Surgery

## 2018-08-30 DIAGNOSIS — Z7951 Long term (current) use of inhaled steroids: Secondary | ICD-10-CM

## 2018-08-30 DIAGNOSIS — Z79899 Other long term (current) drug therapy: Secondary | ICD-10-CM | POA: Diagnosis not present

## 2018-08-30 DIAGNOSIS — E785 Hyperlipidemia, unspecified: Secondary | ICD-10-CM | POA: Diagnosis not present

## 2018-08-30 DIAGNOSIS — Z8701 Personal history of pneumonia (recurrent): Secondary | ICD-10-CM | POA: Diagnosis not present

## 2018-08-30 DIAGNOSIS — M1712 Unilateral primary osteoarthritis, left knee: Secondary | ICD-10-CM

## 2018-08-30 DIAGNOSIS — M170 Bilateral primary osteoarthritis of knee: Principal | ICD-10-CM | POA: Diagnosis present

## 2018-08-30 DIAGNOSIS — Z87442 Personal history of urinary calculi: Secondary | ICD-10-CM

## 2018-08-30 DIAGNOSIS — H9190 Unspecified hearing loss, unspecified ear: Secondary | ICD-10-CM | POA: Diagnosis not present

## 2018-08-30 DIAGNOSIS — N529 Male erectile dysfunction, unspecified: Secondary | ICD-10-CM | POA: Diagnosis present

## 2018-08-30 DIAGNOSIS — K210 Gastro-esophageal reflux disease with esophagitis: Secondary | ICD-10-CM | POA: Diagnosis present

## 2018-08-30 DIAGNOSIS — Z974 Presence of external hearing-aid: Secondary | ICD-10-CM

## 2018-08-30 DIAGNOSIS — Z87891 Personal history of nicotine dependence: Secondary | ICD-10-CM | POA: Diagnosis not present

## 2018-08-30 DIAGNOSIS — Z96651 Presence of right artificial knee joint: Secondary | ICD-10-CM | POA: Diagnosis not present

## 2018-08-30 DIAGNOSIS — M25569 Pain in unspecified knee: Secondary | ICD-10-CM | POA: Diagnosis present

## 2018-08-30 DIAGNOSIS — J45909 Unspecified asthma, uncomplicated: Secondary | ICD-10-CM | POA: Diagnosis not present

## 2018-08-30 DIAGNOSIS — M1710 Unilateral primary osteoarthritis, unspecified knee: Secondary | ICD-10-CM | POA: Diagnosis present

## 2018-08-30 DIAGNOSIS — Z96652 Presence of left artificial knee joint: Secondary | ICD-10-CM | POA: Diagnosis not present

## 2018-08-30 DIAGNOSIS — M1711 Unilateral primary osteoarthritis, right knee: Secondary | ICD-10-CM | POA: Diagnosis not present

## 2018-08-30 DIAGNOSIS — Z825 Family history of asthma and other chronic lower respiratory diseases: Secondary | ICD-10-CM | POA: Diagnosis not present

## 2018-08-30 DIAGNOSIS — G8918 Other acute postprocedural pain: Secondary | ICD-10-CM | POA: Diagnosis not present

## 2018-08-30 HISTORY — PX: TOTAL KNEE ARTHROPLASTY: SHX125

## 2018-08-30 SURGERY — ARTHROPLASTY, KNEE, BILATERAL, TOTAL
Anesthesia: Regional | Site: Knee | Laterality: Bilateral

## 2018-08-30 MED ORDER — ROPIVACAINE HCL 5 MG/ML IJ SOLN
INTRAMUSCULAR | Status: DC | PRN
  Administered 2018-08-30 (×2): 20 mL via EPIDURAL

## 2018-08-30 MED ORDER — 0.9 % SODIUM CHLORIDE (POUR BTL) OPTIME
TOPICAL | Status: DC | PRN
  Administered 2018-08-30: 1000 mL

## 2018-08-30 MED ORDER — DOCUSATE SODIUM 100 MG PO CAPS
100.0000 mg | ORAL_CAPSULE | Freq: Two times a day (BID) | ORAL | Status: DC
  Administered 2018-08-30 – 2018-09-03 (×8): 100 mg via ORAL
  Filled 2018-08-30 (×8): qty 1

## 2018-08-30 MED ORDER — PROPOFOL 1000 MG/100ML IV EMUL
INTRAVENOUS | Status: AC
  Filled 2018-08-30: qty 100

## 2018-08-30 MED ORDER — ONDANSETRON HCL 4 MG/2ML IJ SOLN
INTRAMUSCULAR | Status: DC | PRN
  Administered 2018-08-30: 4 mg via INTRAVENOUS

## 2018-08-30 MED ORDER — ACETAMINOPHEN 160 MG/5ML PO SOLN: 325.0000 mg | ORAL | Status: DC | PRN

## 2018-08-30 MED ORDER — PROPOFOL 500 MG/50ML IV EMUL
INTRAVENOUS | Status: DC | PRN
  Administered 2018-08-30: 50 ug via INTRAVENOUS

## 2018-08-30 MED ORDER — MIDAZOLAM HCL 2 MG/2ML IJ SOLN
INTRAMUSCULAR | Status: AC
  Administered 2018-08-30: 2 mg via INTRAVENOUS
  Filled 2018-08-30: qty 2

## 2018-08-30 MED ORDER — ACETAMINOPHEN 325 MG PO TABS: 325.0000 mg | ORAL_TABLET | ORAL | Status: DC | PRN

## 2018-08-30 MED ORDER — FENTANYL CITRATE (PF) 100 MCG/2ML IJ SOLN: 25.0000 ug | INTRAMUSCULAR | Status: DC | PRN

## 2018-08-30 MED ORDER — LEVALBUTEROL TARTRATE 45 MCG/ACT IN AERO: 1.0000 | INHALATION_SPRAY | RESPIRATORY_TRACT | Status: DC | PRN

## 2018-08-30 MED ORDER — KETAMINE HCL 50 MG/5ML IJ SOSY
PREFILLED_SYRINGE | INTRAMUSCULAR | Status: AC
  Filled 2018-08-30: qty 5

## 2018-08-30 MED ORDER — METOCLOPRAMIDE HCL 5 MG PO TABS: 5.0000 mg | ORAL_TABLET | Freq: Three times a day (TID) | ORAL | Status: DC | PRN

## 2018-08-30 MED ORDER — FENTANYL CITRATE (PF) 100 MCG/2ML IJ SOLN
INTRAMUSCULAR | Status: DC | PRN
  Administered 2018-08-30 (×2): 50 ug via INTRAVENOUS
  Administered 2018-08-30: 25 ug via INTRAVENOUS
  Administered 2018-08-30: 50 ug via INTRAVENOUS
  Administered 2018-08-30: 25 ug via INTRAVENOUS
  Administered 2018-08-30: 50 ug via INTRAVENOUS

## 2018-08-30 MED ORDER — PANTOPRAZOLE SODIUM 40 MG PO TBEC
40.0000 mg | DELAYED_RELEASE_TABLET | Freq: Every day | ORAL | Status: DC
  Administered 2018-08-31 – 2018-09-03 (×4): 40 mg via ORAL
  Filled 2018-08-30 (×4): qty 1

## 2018-08-30 MED ORDER — SODIUM CHLORIDE (PF) 0.9 % IJ SOLN
INTRAMUSCULAR | Status: DC | PRN
  Administered 2018-08-30: 20 mL

## 2018-08-30 MED ORDER — ONDANSETRON HCL 4 MG/2ML IJ SOLN: 4.0000 mg | Freq: Four times a day (QID) | INTRAMUSCULAR | Status: DC | PRN

## 2018-08-30 MED ORDER — DEXAMETHASONE SODIUM PHOSPHATE 10 MG/ML IJ SOLN
INTRAMUSCULAR | Status: AC
  Filled 2018-08-30: qty 1

## 2018-08-30 MED ORDER — LIDOCAINE 2% (20 MG/ML) 5 ML SYRINGE
INTRAMUSCULAR | Status: AC
  Filled 2018-08-30: qty 5

## 2018-08-30 MED ORDER — PROPOFOL 10 MG/ML IV BOLUS
INTRAVENOUS | Status: DC | PRN
  Administered 2018-08-30: 200 mg via INTRAVENOUS

## 2018-08-30 MED ORDER — ONDANSETRON HCL 4 MG PO TABS: 4.0000 mg | ORAL_TABLET | Freq: Four times a day (QID) | ORAL | Status: DC | PRN

## 2018-08-30 MED ORDER — MOMETASONE FURO-FORMOTEROL FUM 200-5 MCG/ACT IN AERO
2.0000 | INHALATION_SPRAY | Freq: Two times a day (BID) | RESPIRATORY_TRACT | Status: DC
  Administered 2018-08-31 – 2018-09-01 (×3): 2 via RESPIRATORY_TRACT
  Filled 2018-08-30: qty 8.8

## 2018-08-30 MED ORDER — BUPIVACAINE-EPINEPHRINE 0.25% -1:200000 IJ SOLN
INTRAMUSCULAR | Status: DC | PRN
  Administered 2018-08-30: 30 mL

## 2018-08-30 MED ORDER — MORPHINE SULFATE (PF) 4 MG/ML IV SOLN
INTRAVENOUS | Status: AC
  Filled 2018-08-30: qty 2

## 2018-08-30 MED ORDER — ALBUTEROL SULFATE (2.5 MG/3ML) 0.083% IN NEBU: 2.5000 mg | INHALATION_SOLUTION | RESPIRATORY_TRACT | Status: DC | PRN

## 2018-08-30 MED ORDER — ASPIRIN 81 MG PO CHEW
81.0000 mg | CHEWABLE_TABLET | Freq: Two times a day (BID) | ORAL | Status: DC
  Administered 2018-08-30 – 2018-09-03 (×8): 81 mg via ORAL
  Filled 2018-08-30 (×8): qty 1

## 2018-08-30 MED ORDER — ONDANSETRON HCL 4 MG/2ML IJ SOLN: 4.0000 mg | Freq: Once | INTRAMUSCULAR | Status: DC | PRN

## 2018-08-30 MED ORDER — METOCLOPRAMIDE HCL 5 MG/ML IJ SOLN: 5.0000 mg | Freq: Three times a day (TID) | INTRAMUSCULAR | Status: DC | PRN

## 2018-08-30 MED ORDER — TRANEXAMIC ACID 1000 MG/10ML IV SOLN
2000.0000 mg | INTRAVENOUS | Status: AC
  Administered 2018-08-30: 2000 mg via TOPICAL
  Filled 2018-08-30: qty 20

## 2018-08-30 MED ORDER — MENTHOL 3 MG MT LOZG: 1.0000 | LOZENGE | OROMUCOSAL | Status: DC | PRN

## 2018-08-30 MED ORDER — VANCOMYCIN HCL IN DEXTROSE 1-5 GM/200ML-% IV SOLN
1000.0000 mg | Freq: Two times a day (BID) | INTRAVENOUS | Status: AC
  Administered 2018-08-30: 1000 mg via INTRAVENOUS
  Filled 2018-08-30: qty 200

## 2018-08-30 MED ORDER — MAGNESIUM OXIDE 400 (241.3 MG) MG PO TABS
400.0000 mg | ORAL_TABLET | Freq: Every day | ORAL | Status: DC
  Administered 2018-08-30 – 2018-09-03 (×5): 400 mg via ORAL
  Filled 2018-08-30 (×5): qty 1

## 2018-08-30 MED ORDER — CLONIDINE HCL (ANALGESIA) 100 MCG/ML EP SOLN
150.0000 ug | Freq: Once | EPIDURAL | Status: AC
  Administered 2018-08-30: 1 mL via INTRA_ARTICULAR
  Filled 2018-08-30: qty 10

## 2018-08-30 MED ORDER — DIFLUPREDNATE 0.05 % OP EMUL
1.0000 [drp] | Freq: Two times a day (BID) | OPHTHALMIC | Status: DC
  Administered 2018-08-31 – 2018-09-02 (×5): 1 [drp] via OPHTHALMIC

## 2018-08-30 MED ORDER — BUPIVACAINE HCL (PF) 0.25 % IJ SOLN
INTRAMUSCULAR | Status: AC
  Filled 2018-08-30: qty 30

## 2018-08-30 MED ORDER — SODIUM CHLORIDE 0.9 % IR SOLN
Status: DC | PRN
  Administered 2018-08-30: 3000 mL

## 2018-08-30 MED ORDER — OXYCODONE HCL 5 MG PO TABS
5.0000 mg | ORAL_TABLET | ORAL | Status: DC | PRN
  Administered 2018-08-31 – 2018-09-03 (×10): 10 mg via ORAL
  Filled 2018-08-30 (×11): qty 2

## 2018-08-30 MED ORDER — FENTANYL CITRATE (PF) 100 MCG/2ML IJ SOLN
INTRAMUSCULAR | Status: AC
  Filled 2018-08-30: qty 2

## 2018-08-30 MED ORDER — METHOCARBAMOL 500 MG PO TABS
500.0000 mg | ORAL_TABLET | Freq: Four times a day (QID) | ORAL | Status: DC | PRN
  Administered 2018-08-31 – 2018-09-03 (×6): 500 mg via ORAL
  Filled 2018-08-30 (×6): qty 1

## 2018-08-30 MED ORDER — OXYCODONE HCL 5 MG PO TABS: 5.0000 mg | ORAL_TABLET | Freq: Once | ORAL | Status: DC | PRN

## 2018-08-30 MED ORDER — OXYCODONE HCL ER 10 MG PO T12A
10.0000 mg | EXTENDED_RELEASE_TABLET | Freq: Two times a day (BID) | ORAL | Status: DC
  Administered 2018-08-30 – 2018-09-03 (×8): 10 mg via ORAL
  Filled 2018-08-30 (×8): qty 1

## 2018-08-30 MED ORDER — CHLORHEXIDINE GLUCONATE 4 % EX LIQD: 60.0000 mL | Freq: Once | CUTANEOUS | Status: DC

## 2018-08-30 MED ORDER — MIDAZOLAM HCL 2 MG/2ML IJ SOLN
2.0000 mg | Freq: Once | INTRAMUSCULAR | Status: AC
  Administered 2018-08-30: 2 mg via INTRAVENOUS

## 2018-08-30 MED ORDER — EPHEDRINE 5 MG/ML INJ
INTRAVENOUS | Status: AC
  Filled 2018-08-30: qty 10

## 2018-08-30 MED ORDER — CLONIDINE HCL (ANALGESIA) 100 MCG/ML EP SOLN
EPIDURAL | Status: DC | PRN
  Administered 2018-08-30 (×2): 50 ug

## 2018-08-30 MED ORDER — PHENYLEPHRINE 40 MCG/ML (10ML) SYRINGE FOR IV PUSH (FOR BLOOD PRESSURE SUPPORT)
PREFILLED_SYRINGE | INTRAVENOUS | Status: AC
  Filled 2018-08-30: qty 10

## 2018-08-30 MED ORDER — OXYCODONE HCL 5 MG/5ML PO SOLN: 5.0000 mg | Freq: Once | ORAL | Status: DC | PRN

## 2018-08-30 MED ORDER — PROPOFOL 10 MG/ML IV BOLUS
INTRAVENOUS | Status: AC
  Filled 2018-08-30: qty 40

## 2018-08-30 MED ORDER — DEXAMETHASONE SODIUM PHOSPHATE 10 MG/ML IJ SOLN
INTRAMUSCULAR | Status: DC | PRN
  Administered 2018-08-30: 10 mg via INTRAVENOUS

## 2018-08-30 MED ORDER — PHENOL 1.4 % MT LIQD: 1.0000 | OROMUCOSAL | Status: DC | PRN

## 2018-08-30 MED ORDER — MIDAZOLAM HCL 2 MG/2ML IJ SOLN
INTRAMUSCULAR | Status: AC
  Filled 2018-08-30: qty 2

## 2018-08-30 MED ORDER — LACTATED RINGERS IV SOLN
INTRAVENOUS | Status: DC | PRN
  Administered 2018-08-30 (×2): via INTRAVENOUS

## 2018-08-30 MED ORDER — CELECOXIB 100 MG PO CAPS
100.0000 mg | ORAL_CAPSULE | Freq: Two times a day (BID) | ORAL | Status: DC | PRN
  Administered 2018-08-31: 100 mg via ORAL
  Filled 2018-08-30 (×2): qty 1

## 2018-08-30 MED ORDER — FENTANYL CITRATE (PF) 250 MCG/5ML IJ SOLN
INTRAMUSCULAR | Status: AC
  Filled 2018-08-30: qty 5

## 2018-08-30 MED ORDER — PROPOFOL 500 MG/50ML IV EMUL
INTRAVENOUS | Status: DC | PRN
  Administered 2018-08-30: 50 ug/kg/min via INTRAVENOUS

## 2018-08-30 MED ORDER — ONDANSETRON HCL 4 MG/2ML IJ SOLN
INTRAMUSCULAR | Status: AC
  Filled 2018-08-30: qty 2

## 2018-08-30 MED ORDER — MIDAZOLAM HCL 5 MG/5ML IJ SOLN
INTRAMUSCULAR | Status: DC | PRN
  Administered 2018-08-30: 2 mg via INTRAVENOUS

## 2018-08-30 MED ORDER — FENTANYL CITRATE (PF) 100 MCG/2ML IJ SOLN
100.0000 ug | Freq: Once | INTRAMUSCULAR | Status: AC
  Administered 2018-08-30: 100 ug via INTRAVENOUS

## 2018-08-30 MED ORDER — HYDROMORPHONE HCL 1 MG/ML IJ SOLN
0.5000 mg | INTRAMUSCULAR | Status: DC | PRN
  Administered 2018-08-31 – 2018-09-01 (×2): 0.5 mg via INTRAVENOUS
  Filled 2018-08-30 (×4): qty 1

## 2018-08-30 MED ORDER — PROPOFOL 500 MG/50ML IV EMUL: INTRAVENOUS | Status: DC | PRN

## 2018-08-30 MED ORDER — LACTATED RINGERS IV SOLN: INTRAVENOUS | Status: AC

## 2018-08-30 MED ORDER — METHOCARBAMOL 1000 MG/10ML IJ SOLN
500.0000 mg | Freq: Four times a day (QID) | INTRAVENOUS | Status: DC | PRN
  Filled 2018-08-30: qty 5

## 2018-08-30 MED ORDER — EPHEDRINE SULFATE-NACL 50-0.9 MG/10ML-% IV SOSY
PREFILLED_SYRINGE | INTRAVENOUS | Status: DC | PRN
  Administered 2018-08-30: 10 mg via INTRAVENOUS

## 2018-08-30 MED ORDER — BUPIVACAINE LIPOSOME 1.3 % IJ SUSP
20.0000 mL | INTRAMUSCULAR | Status: AC
  Administered 2018-08-30: 20 mL
  Filled 2018-08-30: qty 20

## 2018-08-30 MED ORDER — ADULT MULTIVITAMIN W/MINERALS CH
1.0000 | ORAL_TABLET | Freq: Every day | ORAL | Status: DC
  Administered 2018-08-30 – 2018-09-03 (×5): 1 via ORAL
  Filled 2018-08-30 (×5): qty 1

## 2018-08-30 MED ORDER — ACETAMINOPHEN 325 MG PO TABS
325.0000 mg | ORAL_TABLET | Freq: Four times a day (QID) | ORAL | Status: DC | PRN
  Administered 2018-08-31 – 2018-09-01 (×3): 650 mg via ORAL
  Filled 2018-08-30 (×3): qty 2

## 2018-08-30 MED ORDER — MORPHINE SULFATE (PF) 4 MG/ML IV SOLN
INTRAVENOUS | Status: DC | PRN
  Administered 2018-08-30: 8 mg via INTRAMUSCULAR

## 2018-08-30 MED ORDER — ACETAMINOPHEN 10 MG/ML IV SOLN
INTRAVENOUS | Status: AC
  Filled 2018-08-30: qty 100

## 2018-08-30 MED ORDER — MEPERIDINE HCL 50 MG/ML IJ SOLN: 6.2500 mg | INTRAMUSCULAR | Status: DC | PRN

## 2018-08-30 MED ORDER — KETAMINE HCL 10 MG/ML IJ SOLN
INTRAMUSCULAR | Status: DC | PRN
  Administered 2018-08-30: 10 mg via INTRAVENOUS
  Administered 2018-08-30 (×2): 20 mg via INTRAVENOUS

## 2018-08-30 MED ORDER — ACETAMINOPHEN 10 MG/ML IV SOLN
INTRAVENOUS | Status: DC | PRN
  Administered 2018-08-30: 1000 mg via INTRAVENOUS

## 2018-08-30 MED ORDER — LIDOCAINE 2% (20 MG/ML) 5 ML SYRINGE
INTRAMUSCULAR | Status: DC | PRN
  Administered 2018-08-30: 70 mg via INTRAVENOUS

## 2018-08-30 SURGICAL SUPPLY — 99 items
BAG DECANTER FOR FLEXI CONT (MISCELLANEOUS) IMPLANT
BANDAGE ACE 4X5 VEL STRL LF (GAUZE/BANDAGES/DRESSINGS) ×6 IMPLANT
BANDAGE ESMARK 6X9 LF (GAUZE/BANDAGES/DRESSINGS) ×1 IMPLANT
BLADE SAG 18X100X1.27 (BLADE) ×3 IMPLANT
BLADE SAW SGTL 13.0X1.19X90.0M (BLADE) IMPLANT
BLADE SURG 10 STRL SS (BLADE) ×18 IMPLANT
BLADE SURG 15 STRL LF DISP TIS (BLADE) IMPLANT
BLADE SURG 15 STRL SS (BLADE) ×3
BNDG CMPR 9X6 STRL LF SNTH (GAUZE/BANDAGES/DRESSINGS) ×1
BNDG CMPR MED 10X6 ELC LF (GAUZE/BANDAGES/DRESSINGS) ×2
BNDG COHESIVE 6X5 TAN STRL LF (GAUZE/BANDAGES/DRESSINGS) ×6 IMPLANT
BNDG ELASTIC 6X10 VLCR STRL LF (GAUZE/BANDAGES/DRESSINGS) ×6 IMPLANT
BNDG ESMARK 6X9 LF (GAUZE/BANDAGES/DRESSINGS) ×3
BOWL SMART MIX CTS (DISPOSABLE) IMPLANT
BSPLAT TIB 6 KN TRITANIUM (Knees) ×1 IMPLANT
BSPLAT TIB 7 KN TRITANIUM (Knees) ×1 IMPLANT
CLOSURE STERI-STRIP 1/2X4 (GAUZE/BANDAGES/DRESSINGS) ×2
CLOSURE WOUND 1/2 X4 (GAUZE/BANDAGES/DRESSINGS) ×5
CLSR STERI-STRIP ANTIMIC 1/2X4 (GAUZE/BANDAGES/DRESSINGS) ×4 IMPLANT
COMP FEMORAL TRIATHLON LFT SZ7 (Orthopedic Implant) ×3 IMPLANT
COMPONENT FEMRL TRTHLON LT SZ7 (Orthopedic Implant) IMPLANT
COMPONENT TRI CR RETAIN SZ6 RT (Miscellaneous) IMPLANT
COVER SURGICAL LIGHT HANDLE (MISCELLANEOUS) ×3 IMPLANT
COVER WAND RF STERILE (DRAPES) ×3 IMPLANT
CUFF TOURNIQUET SINGLE 34IN LL (TOURNIQUET CUFF) ×6 IMPLANT
CUFF TOURNIQUET SINGLE 44IN (TOURNIQUET CUFF) IMPLANT
DECANTER SPIKE VIAL GLASS SM (MISCELLANEOUS) ×3 IMPLANT
DRAPE EXTREMITY BILATERAL (DRAPES) ×3 IMPLANT
DRAPE HALF SHEET 40X57 (DRAPES) ×2 IMPLANT
DRAPE INCISE IOBAN 66X45 STRL (DRAPES) ×3 IMPLANT
DRAPE ORTHO SPLIT 77X108 STRL (DRAPES) ×9
DRAPE SURG ORHT 6 SPLT 77X108 (DRAPES) ×3 IMPLANT
DRAPE U-SHAPE 47X51 STRL (DRAPES) ×6 IMPLANT
DRSG AQUACEL AG ADV 3.5X10 (GAUZE/BANDAGES/DRESSINGS) ×6 IMPLANT
DRSG AQUACEL AG ADV 3.5X14 (GAUZE/BANDAGES/DRESSINGS) ×6 IMPLANT
DRSG PAD ABDOMINAL 8X10 ST (GAUZE/BANDAGES/DRESSINGS) ×12 IMPLANT
DURAPREP 26ML APPLICATOR (WOUND CARE) ×8 IMPLANT
ELECT REM PT RETURN 9FT ADLT (ELECTROSURGICAL) ×3
ELECTRODE REM PT RTRN 9FT ADLT (ELECTROSURGICAL) ×1 IMPLANT
GAUZE SPONGE 4X4 12PLY STRL (GAUZE/BANDAGES/DRESSINGS) ×6 IMPLANT
GAUZE XEROFORM 1X8 LF (GAUZE/BANDAGES/DRESSINGS) ×6 IMPLANT
GAUZE XEROFORM 5X9 LF (GAUZE/BANDAGES/DRESSINGS) ×6 IMPLANT
GLOVE BIOGEL PI IND STRL 7.5 (GLOVE) ×1 IMPLANT
GLOVE BIOGEL PI IND STRL 8 (GLOVE) ×3 IMPLANT
GLOVE BIOGEL PI INDICATOR 7.5 (GLOVE) ×2
GLOVE BIOGEL PI INDICATOR 8 (GLOVE) ×6
GLOVE ECLIPSE 7.0 STRL STRAW (GLOVE) ×6 IMPLANT
GLOVE SURG ORTHO 8.0 STRL STRW (GLOVE) ×6 IMPLANT
GOWN STRL REUS W/ TWL LRG LVL3 (GOWN DISPOSABLE) ×3 IMPLANT
GOWN STRL REUS W/TWL 2XL LVL3 (GOWN DISPOSABLE) ×3 IMPLANT
GOWN STRL REUS W/TWL LRG LVL3 (GOWN DISPOSABLE) ×9
HANDPIECE INTERPULSE COAX TIP (DISPOSABLE) ×3
HOOD PEEL AWAY FACE SHEILD DIS (HOOD) ×21 IMPLANT
IMMOBILIZER KNEE 20 (SOFTGOODS) IMPLANT
IMMOBILIZER KNEE 22 (SOFTGOODS) ×4 IMPLANT
IMMOBILIZER KNEE 22 UNIV (SOFTGOODS) ×6 IMPLANT
IMMOBILIZER KNEE 24 THIGH 36 (MISCELLANEOUS) IMPLANT
IMMOBILIZER KNEE 24 UNIV (MISCELLANEOUS)
INSERT TRIATHLON CS TIB X3 13 (Joint) ×2 IMPLANT
INSERT TRIATHLON CS TIB X3 9 (Joint) ×2 IMPLANT
KIT BASIN OR (CUSTOM PROCEDURE TRAY) ×3 IMPLANT
KIT TURNOVER KIT B (KITS) ×3 IMPLANT
KNEE PATELLA ASYMMETRIC 10X35 (Knees) ×4 IMPLANT
KNEE TIBIAL COMPONENT SZ6 (Knees) ×2 IMPLANT
KNEE TIBIAL COMPONENT SZ7 (Knees) ×2 IMPLANT
MANIFOLD NEPTUNE II (INSTRUMENTS) ×3 IMPLANT
NDL SPNL 18GX3.5 QUINCKE PK (NEEDLE) ×1 IMPLANT
NEEDLE 18GX1X1/2 (RX/OR ONLY) (NEEDLE) IMPLANT
NEEDLE SPNL 18GX3.5 QUINCKE PK (NEEDLE) ×3 IMPLANT
NS IRRIG 1000ML POUR BTL (IV SOLUTION) ×14 IMPLANT
PACK TOTAL JOINT (CUSTOM PROCEDURE TRAY) ×3 IMPLANT
PACK UNIVERSAL I (CUSTOM PROCEDURE TRAY) ×3 IMPLANT
PAD ARMBOARD 7.5X6 YLW CONV (MISCELLANEOUS) ×6 IMPLANT
PAD CAST 4YDX4 CTTN HI CHSV (CAST SUPPLIES) ×2 IMPLANT
PADDING CAST COTTON 4X4 STRL (CAST SUPPLIES) ×6
PADDING CAST COTTON 6X4 STRL (CAST SUPPLIES) ×12 IMPLANT
SET HNDPC FAN SPRY TIP SCT (DISPOSABLE) IMPLANT
SPONGE LAP 18X18 RF (DISPOSABLE) ×4 IMPLANT
SPONGE LAP 18X18 X RAY DECT (DISPOSABLE) ×6 IMPLANT
STAPLER VISISTAT 35W (STAPLE) IMPLANT
STOCKINETTE IMPERVIOUS 9X36 MD (GAUZE/BANDAGES/DRESSINGS) ×3 IMPLANT
STRIP CLOSURE SKIN 1/2X4 (GAUZE/BANDAGES/DRESSINGS) ×10 IMPLANT
SUCTION FRAZIER HANDLE 10FR (MISCELLANEOUS) ×2
SUCTION TUBE FRAZIER 10FR DISP (MISCELLANEOUS) ×1 IMPLANT
SUT MNCRL AB 3-0 PS2 18 (SUTURE) ×6 IMPLANT
SUT VIC AB 0 CTB1 27 (SUTURE) ×12 IMPLANT
SUT VIC AB 1 CT1 27 (SUTURE) ×18
SUT VIC AB 1 CT1 27XBRD ANBCTR (SUTURE) ×6 IMPLANT
SUT VIC AB 2-0 CT1 27 (SUTURE) ×12
SUT VIC AB 2-0 CT1 TAPERPNT 27 (SUTURE) ×4 IMPLANT
SYR 30ML LL (SYRINGE) ×9 IMPLANT
SYR TB 1ML LUER SLIP (SYRINGE) ×3 IMPLANT
TOWEL OR 17X24 6PK STRL BLUE (TOWEL DISPOSABLE) ×6 IMPLANT
TOWEL OR 17X26 10 PK STRL BLUE (TOWEL DISPOSABLE) ×6 IMPLANT
TRAY CATH 16FR W/PLASTIC CATH (SET/KITS/TRAYS/PACK) IMPLANT
TRAY FOLEY MTR SLVR 14FR STAT (SET/KITS/TRAYS/PACK) ×3 IMPLANT
TRAY FOLEY MTR SLVR 16FR STAT (SET/KITS/TRAYS/PACK) IMPLANT
TRIATHLON CRUCIATE RETAIN SZ6 (Miscellaneous) ×3 IMPLANT
WATER STERILE IRR 1000ML POUR (IV SOLUTION) ×1 IMPLANT

## 2018-08-30 NOTE — Anesthesia Preprocedure Evaluation (Addendum)
Anesthesia Evaluation  Patient identified by MRN, date of birth, ID band Patient awake    Reviewed: Allergy & Precautions, H&P , NPO status , Patient's Chart, lab work & pertinent test results, reviewed documented beta blocker date and time   Airway Mallampati: II  TM Distance: >3 FB Neck ROM: full    Dental no notable dental hx.    Pulmonary asthma , former smoker,    Pulmonary exam normal breath sounds clear to auscultation       Cardiovascular Exercise Tolerance: Good negative cardio ROS   Rhythm:regular Rate:Normal     Neuro/Psych  Headaches, Anxiety    GI/Hepatic Neg liver ROS, GERD  Medicated,  Endo/Other  negative endocrine ROS  Renal/GU negative Renal ROS  negative genitourinary   Musculoskeletal  (+) Arthritis , Osteoarthritis,    Abdominal   Peds  Hematology negative hematology ROS (+)   Anesthesia Other Findings   Reproductive/Obstetrics negative OB ROS                            Anesthesia Physical Anesthesia Plan  ASA: II  Anesthesia Plan: General   Post-op Pain Management: GA combined w/ Regional for post-op pain   Induction: Intravenous  PONV Risk Score and Plan: 2 and Treatment may vary due to age or medical condition, Ondansetron and Dexamethasone  Airway Management Planned: LMA  Additional Equipment:   Intra-op Plan:   Post-operative Plan: Extubation in OR  Informed Consent: I have reviewed the patients History and Physical, chart, labs and discussed the procedure including the risks, benefits and alternatives for the proposed anesthesia with the patient or authorized representative who has indicated his/her understanding and acceptance.     Dental Advisory Given  Plan Discussed with: CRNA, Anesthesiologist and Surgeon  Anesthesia Plan Comments: (  )      Anesthesia Quick Evaluation

## 2018-08-30 NOTE — Anesthesia Procedure Notes (Signed)
Procedure Name: LMA Insertion Date/Time: 08/30/2018 12:19 PM Performed by: Scheryl Darter, CRNA Pre-anesthesia Checklist: Patient identified, Emergency Drugs available, Suction available and Patient being monitored Patient Re-evaluated:Patient Re-evaluated prior to induction Oxygen Delivery Method: Circle System Utilized Preoxygenation: Pre-oxygenation with 100% oxygen Induction Type: IV induction Ventilation: Mask ventilation without difficulty LMA: LMA inserted LMA Size: 5.0 Number of attempts: 1 Airway Equipment and Method: Bite block Placement Confirmation: positive ETCO2 Tube secured with: Tape Dental Injury: Teeth and Oropharynx as per pre-operative assessment

## 2018-08-30 NOTE — Anesthesia Procedure Notes (Addendum)
Anesthesia Regional Block: Adductor canal block   Pre-Anesthetic Checklist: ,, timeout performed, Correct Patient, Correct Site, Correct Laterality, Correct Procedure, Correct Position, site marked, Risks and benefits discussed,  Surgical consent,  Pre-op evaluation,  At surgeon's request and post-op pain management  Laterality: Left and Right  Prep: chloraprep       Needles:  Injection technique: Single-shot  Needle Type: Echogenic Stimulator Needle     Needle Length: 5cm  Needle Gauge: 22     Additional Needles:   Procedures:, nerve stimulator,,, ultrasound used (permanent image in chart),,,,  Narrative:  Start time: 08/30/2018 10:05 AM End time: 08/30/2018 10:15 AM Injection made incrementally with aspirations every 5 mL.  Performed by: Personally  Anesthesiologist: Janeece Riggers, MD  Additional Notes: Functioning IV was confirmed and monitors were applied.  A 68mm 22ga Arrow echogenic stimulator needle was used. Sterile prep and drape,hand hygiene and sterile gloves were used. Ultrasound guidance: relevant anatomy identified, needle position confirmed, local anesthetic spread visualized around nerve(s)., vascular puncture avoided.  Image printed for medical record. Negative aspiration and negative test dose prior to incremental administration of local anesthetic. The patient tolerated the procedure well.

## 2018-08-30 NOTE — Progress Notes (Signed)
Orthopedic Tech Progress Note Patient Details:  Brett Berry [birth year] [id number]  CPM Right Knee CPM Right Knee: On Right Knee Flexion (Degrees): 40 Right Knee Extension (Degrees): Brett Berry 08/30/2018, 5:32 PM

## 2018-08-30 NOTE — Op Note (Signed)
NAME: Brett Berry, Brett Berry MEDICAL RECORD [id number] ACCOUNT [id number] DATE OF BIRTH:August 02, 1951 FACILITY: [facility] LOCATION: [facility] PHYSICIAN:Beauden Tremont Randel Pigg, MD  OPERATIVE REPORT  DATE OF PROCEDURE:  08/30/2018  PREOPERATIVE DIAGNOSIS:  Bilateral knee arthritis.  POSTOPERATIVE DIAGNOSIS:  Bilateral knee arthritis.  PROCEDURE:  Bilateral total knee replacement using Stryker press-fit component, posterior cruciate retaining.  On the left, size 7 femur, 6 tibia with 9 mm poly insert, 35 mm 3 peg press-fit patella.  On the right, size 6 femur, size 7 tibia, size 13  spacer with 35 mm 3 peg press press-fit patella.  SURGEON:  Meredith Pel, MD  ASSISTANT:  Laure Kidney, RNFA  INDICATIONS:  Brett Berry is a 68 year old patient with bilateral knee arthritis, who presents for operative management after explanation of risks and benefits.  PROCEDURE IN DETAIL:  The patient was brought to the operating room where general anesthetic was induced.  Preoperative antibiotics administered.  Timeout was called.  Both legs were prescrubbed with alcohol and Betadine, allowed to air dry.  Prep with  DuraPrep solution and draped in a sterile manner.  Ioban used to cover the operative field.  Timeout was called for both legs.  Left leg was elevated with the Esmarch, tourniquet was inflated to 300 mmHg for a total tourniquet time of 59 minutes.   Anterior approach to knee was made.  Skin and subcutaneous tissue were sharply divided.  Patella was everted.  The precise location of the arthrotomy was marked with a #1 Vicryl suture.  Minimal soft tissue dissection was performed medially.  The lateral  patellofemoral ligament was released.  The soft tissue anterior distal femur was also released.  Using intramedullary alignment in the tibia, the tibial guide was placed and 9 mm cut was made off the least affected lateral tibial plateau.  An 8 mm cut  was then made off the femur distal.  This was using intramedullary  alignment cut in 5 degrees of valgus.  The sizer was placed and the patient had between size 6 and 7, but the size 6 would notch in this femur.  Size 7 was chosen.  Anterior, posterior  and chamfer cuts were made.  Collaterals were protected.  Trial components were placed in the tibia and the femur.  The patient achieved full extension, full flexion with a 9 mm spacer.  Good stability to varus and valgus stress at 0, 30 and 90 degrees  noted.  The patella tracked well with no thumbs technique.  At this time, the patella was cut from 24-13.  Trial patellar button was placed with same stability parameters maintained.  Trial components were removed at this time, thorough irrigation  performed.  Exparel used to anesthetize the capsule.  Tranexamic acid sponge was placed into the knee.  This was left in there for 3 minutes.  This was then removed and the press-fit components were tapped into position.  Excellent bone quality was  present.  Very good fit was achieved.  Good stability also achieved.  At this time after placing the components, the tourniquet was released.  Bleeding points encountered and controlled using electrocautery.  Thorough irrigation again performed using  pouring irrigation.  Arthrotomy closed over a bolster using #1 Vicryl suture followed by interrupted inverted 0 Vicryl suture, 2-0 Vicryl suture and a 3-0 Monocryl.  Aquacel dressing placed.  Attention then directed towards the right knee.  The leg was  elevated, exsanguinated with the Esmarch wrap.  Tourniquet was inflated for a total tourniquet time of 74  minutes.  This was at 300 mmHg.  This knee had a more severe arthritis was present.  It also had more varus alignment.  An arthrotomy was made.   Patella was everted.  Precise location of the arthrotomy was marked with a #1 Vicryl suture.  Osteophytes were removed.  Soft tissue dissection performed medially.  Lateral patellofemoral ligament was released.  Soft tissue from the anterior  distal femur  also released.  Intramedullary alignment then used to cut the tibia perpendicular to the mechanical axis.  Initial cut of 9 mm off the least affected lateral side did not get low enough on the medial tibial plateau in order to avoid sclerotic bone for  the press-fit.  Therefore, 2 more millimeters was taken.  This was done with a 3-degree cutting guide.  At this time, the distal femur was cut using intramedullary alignment measuring 8 mm.  This was sized to a size 6.  Slightly more flexion was placed  into the distal femoral cut for this cut.  A size 6 was a good fit.  No notching present.  At this time, trial components placed after the anterior, posterior and chamfer cuts were made.  The tibia was also keel punched on the left knee prior to placing  it.  The patella was cut down from 24-13 and a 35.3 peg patella trial was placed.  A trial reduction was performed with both the 11 and the 13 spacer.  The 13 spacer gave full extension, excellent flexion, and no liftoff.  Trial components were removed.   Thorough irrigation performed.  Exparel and Marcaine solution injected into the knee.  At this time, tranexamic acid sponge was then placed into the incision and it was allowed to sit for 3 minutes.  This was removed and the components were tapped into  position with excellent press-fit obtained.  Very good stability was achieved with a 13 mm spacer.  Tourniquet was released and bleeding points encountered controlled using electrocautery.  Thorough irrigation again performed with pouring irrigation of 3  liters and this was done both knees.  Arthrotomy closed over a bolster using #1 Vicryl suture followed by interrupted inverted 0 Vicryl suture, 2-0 Vicryl suture and a 3-0 Monocryl.  Steri-Strips and Aquacel dressing placed.  Ace wrap, placed in both  legs along with a knee immobilizer.  The patient tolerated the procedure well without immediate complications, transferred to the recovery room in  stable condition.  TN/NUANCE  D:08/30/2018 T:08/30/2018 JOB:[id number]

## 2018-08-30 NOTE — Transfer of Care (Signed)
Immediate Anesthesia Transfer of Care Note  Patient: Brett Berry  Procedure(s) Performed: BILATERAL TOTAL KNEE ARTHROPLASTY (Bilateral Knee)  Patient Location: PACU  Anesthesia Type:GA combined with regional for post-op pain  Level of Consciousness: drowsy  Airway & Oxygen Therapy: Patient Spontanous Breathing and Patient connected to nasal cannula oxygen  Post-op Assessment: Report given to RN, Post -op Vital signs reviewed and stable and Patient moving all extremities  Post vital signs: Reviewed and stable  Last Vitals:  Vitals Value Taken Time  BP 143/83 08/30/2018  4:35 PM  Temp    Pulse 58 08/30/2018  4:38 PM  Resp 11 08/30/2018  4:38 PM  SpO2 100 % 08/30/2018  4:38 PM  Vitals shown include unvalidated device data.  Last Pain:  Vitals:   08/30/18 0951  TempSrc: Oral  PainSc: 0-No pain      Patients Stated Pain Goal: 4 (00/37/04 8889)  Complications: No apparent anesthesia complications

## 2018-08-30 NOTE — Brief Op Note (Signed)
08/30/2018  4:32 PM  PATIENT:  Brett Berry  68 y.o. male  PRE-OPERATIVE DIAGNOSIS:  bilateral knee osteoarthritis  POST-OPERATIVE DIAGNOSIS:  bilateral knee osteoarthritis  PROCEDURE:  Procedure(s): BILATERAL TOTAL KNEE ARTHROPLASTY  SURGEON:  Surgeon(s): Marlou Sa, Tonna Corner, MD  ASSISTANT: Laure Kidney rnfa  ANESTHESIA:   general  EBL: 75 ml    Total I/O In: 1000 [I.V.:1000] Out: 210 [Urine:175; Blood:35]  BLOOD ADMINISTERED: none  DRAINS: none   LOCAL MEDICATIONS USED:  Marcaine mso4 clonidine exparel  SPECIMEN:  No Specimen  COUNTS:  YES  TOURNIQUET:   Total Tourniquet Time Documented: Thigh (Left) - 59 minutes Thigh (Left) - 74 minutes Total: Thigh (Left) - 133 minutes   DICTATION: .Other Dictation: Dictation Number [id number]  PLAN OF CARE: Admit to inpatient   PATIENT DISPOSITION:  PACU - hemodynamically stable

## 2018-08-30 NOTE — H&P (Signed)
TOTAL KNEE ADMISSION H&P  Patient is being admitted for bilaterally total knee arthroplasty.  Subjective:  Chief Complaint:bilaterally knee pain.  HPI: Brett Berry, 68 y.o. male, has a history of pain and functional disability in the bilaterally knee due to arthritis and has failed non-surgical conservative treatments for greater than 12 weeks to includeNSAID's and/or analgesics, corticosteriod injections, viscosupplementation injections and activity modification.  Onset of symptoms was gradual, starting 6 years ago with gradually worsening course since that time. The patient noted no past surgery on the bilaterally knee(s).  Patient currently rates pain in the bilaterally knee(s) at 8 out of 10 with activity. Patient has night pain, worsening of pain with activity and weight bearing, pain that interferes with activities of daily living, pain with passive range of motion and crepitus.  Patient has evidence of subchondral cysts, periarticular osteophytes and joint space narrowing by imaging studies. This patient has had Otherwise good health and is equal to the task of addressing the significant rehabilitative effort that will be required to have a successful outcome.. There is no active infection.  Patient Active Problem List   Diagnosis Date Noted  . CAP (community acquired pneumonia) 08/19/2016  . GERD with esophagitis 08/19/2016  . PCP NOTES >>>>>>>>>>>>>>>>>> 02/18/2016  . DJD (knees, celebrex, ortho Dr Sharol Given) 01/08/2015  . Erectile dysfunction 07/30/2014  . Annual physical exam 12/29/2013  . Non-functioning tympanostomy tube 09/10/2011  . Dysphagia 09/10/2011  . Chronic sinusitis 02/05/2010  . HEADACHE 02/05/2010  . Anxiety -insomnia 01/04/2009  . Hyperlipidemia 01/04/2009  . ALLERGIC RHINITIS 10/27/2007  . Asthma 10/27/2007   Past Medical History:  Diagnosis Date  . Allergic rhinitis   . Asthma   . Bilateral knee pain 2016   x-ray showed medial compartment arthritis, worse on  right than left  . Erectile dysfunction 07/30/2014  . GERD (gastroesophageal reflux disease)   . Hearing difficulty    has aids  . History of kidney stones   . Hyperlipidemia    intolerant to zocor  . Pneumonia   . Sinusitis, chronic     Past Surgical History:  Procedure Laterality Date  . CATARACT EXTRACTION Right 2016  . MASTOIDECTOMY     right   . right hand surg     for infection in '07  . SINUS ENDO WITH FUSION    . TOE SURGERY     Left Great toe    Current Facility-Administered Medications  Medication Dose Route Frequency Provider Last Rate Last Dose  . ceFAZolin (ANCEF) IVPB 2g/100 mL premix  2 g Intravenous To SS-Surg Meredith Pel, MD      . tranexamic acid (CYKLOKAPRON) IVPB 1,000 mg  1,000 mg Intravenous To OR Meredith Pel, MD      . vancomycin (VANCOCIN) IVPB 1000 mg/200 mL premix  1,000 mg Intravenous To SS-Surg Marlou Sa Tonna Corner, MD       Current Outpatient Medications  Medication Sig Dispense Refill Last Dose  . budesonide-formoterol (SYMBICORT) 160-4.5 MCG/ACT inhaler Inhale 2 puffs into the lungs 2 (two) times daily. 30.6 g 3 Taking  . celecoxib (CELEBREX) 100 MG capsule Take 1 capsule (100 mg total) by mouth 2 (two) times daily as needed for moderate pain. 180 capsule 1 Taking  . Coenzyme Q10 (CO Q 10 PO) Take 1 capsule by mouth daily.    Taking  . Difluprednate (DUREZOL) 0.05 % EMUL Place 1 drop into the right eye 2 (two) times daily.     Marland Kitchen esomeprazole (NEXIUM) 40 MG  capsule Take 1 capsule (40 mg total) by mouth 2 (two) times daily before a meal. 180 capsule 3 Taking  . fluticasone (FLONASE) 50 MCG/ACT nasal spray Place 2 sprays into the nose daily.     Taking  . levalbuterol (XOPENEX HFA) 45 MCG/ACT inhaler Inhale 1-2 puffs into the lungs every 4 (four) hours as needed for wheezing or shortness of breath. 15 g 5 Taking  . magnesium oxide (MAG-OX) 400 MG tablet Take 400 mg by mouth daily.     . Multiple Vitamin (MULTIVITAMIN WITH MINERALS) TABS  tablet Take 1 tablet by mouth daily.   Taking  . Omega-3 Fatty Acids (FISH OIL) 1000 MG CAPS Take 1,000 mg by mouth 2 (two) times daily.    Taking  . albuterol (PROVENTIL) (2.5 MG/3ML) 0.083% nebulizer solution Take 3 mLs (2.5 mg total) by nebulization every 4 (four) hours as needed for wheezing or shortness of breath. 75 mL 12 Taking  . cefUROXime (CEFTIN) 500 MG tablet Take 1 tablet (500 mg total) by mouth 2 (two) times daily with a meal. (Patient not taking: Reported on 08/19/2018) 14 tablet 0 Not Taking at Unknown time  . HYDROcodone-homatropine (HYCODAN) 5-1.5 MG/5ML syrup Take 5 mLs by mouth at bedtime as needed for cough. (Patient not taking: Reported on 08/19/2018) 120 mL 0 Not Taking at Unknown time   No Known Allergies  Social History   Tobacco Use  . Smoking status: Former Smoker    Last attempt to quit: 09/08/1973    Years since quitting: 45.0  . Smokeless tobacco: Never Used  Substance Use Topics  . Alcohol use: Yes    Comment: 2-3 times week    Family History  Problem Relation Age of Onset  . COPD Mother   . Breast cancer Mother   . Alzheimer's disease Father   . Lung cancer Father        smoker  . Prostate cancer Father        died age 51  . Heart disease Father        age 7  . Colon cancer Neg Hx      Review of Systems  Musculoskeletal: Positive for joint pain.  All other systems reviewed and are negative.   Objective:  Physical Exam  Constitutional: He appears well-developed.  HENT:  Head: Normocephalic.  Eyes: Pupils are equal, round, and reactive to light.  Neck: Normal range of motion.  Cardiovascular: Normal rate.  Respiratory: Effort normal.  Neurological: He is alert.  Skin: Skin is warm.  Psychiatric: He has a normal mood and affect.  Examination of both knees demonstrates about a 5 degree flexion contracture.  Varus alignment present.  Extensor mechanism is intact.  Skin is intact.  Pedal pulses palpable.  No groin pain with internal X rotation  of the leg.  Patient does have range of motion past 90 degrees in both knees.  Vital signs in last 24 hours:    Labs:   Estimated body mass index is 27.46 kg/m as calculated from the following:   Height as of 08/24/18: 5\' 10"  (1.778 m).   Weight as of 08/24/18: 86.8 kg.   Imaging Review Plain radiographs demonstrate moderate degenerative joint disease of the bilaterally knee(s). The overall alignment ismild varus. The bone quality appears to be good for age and reported activity level.   Preoperative templating of the joint replacement has been completed, documented, and submitted to the Operating Room personnel in order to optimize intra-operative equipment management.  Anticipated LOS equal to or greater than 2 midnights due to - Age 68 and older with one or more of the following:  - Obesity  - Expected need for hospital services (PT, OT, Nursing) required for safe  discharge  - Anticipated need for postoperative skilled nursing care or inpatient rehab  - Active co-morbidities: None OR   - Unanticipated findings during/Post Surgery: None  - Patient is a high risk of re-admission due to: None     Assessment/Plan:  End stage arthritis, bilaterally knee   The patient history, physical examination, clinical judgment of the provider and imaging studies are consistent with end stage degenerative joint disease of the bilaterally knee(s) and total knee arthroplasty is deemed medically necessary. The treatment options including medical management, injection therapy arthroscopy and arthroplasty were discussed at length. The risks and benefits of total knee arthroplasty were presented and reviewed. The risks due to aseptic loosening, infection, stiffness, patella tracking problems, thromboembolic complications and other imponderables were discussed. The patient acknowledged the explanation, agreed to proceed with the plan and consent was signed. Patient is being admitted for inpatient  treatment for surgery, pain control, PT, OT, prophylactic antibiotics, VTE prophylaxis, progressive ambulation and ADL's and discharge planning. The patient is planning to be discharged home with home health services

## 2018-08-31 ENCOUNTER — Other Ambulatory Visit: Payer: Self-pay

## 2018-08-31 ENCOUNTER — Encounter (HOSPITAL_COMMUNITY): Payer: Self-pay | Admitting: General Practice

## 2018-08-31 LAB — CBC
HCT: 32.7 % — ABNORMAL LOW (ref 39.0–52.0)
Hemoglobin: 10.9 g/dL — ABNORMAL LOW (ref 13.0–17.0)
MCH: 32.1 pg (ref 26.0–34.0)
MCHC: 33.3 g/dL (ref 30.0–36.0)
MCV: 96.2 fL (ref 80.0–100.0)
Platelets: 173 10*3/uL (ref 150–400)
RBC: 3.4 MIL/uL — AB (ref 4.22–5.81)
RDW: 13.2 % (ref 11.5–15.5)
WBC: 12.5 10*3/uL — ABNORMAL HIGH (ref 4.0–10.5)
nRBC: 0 % (ref 0.0–0.2)

## 2018-08-31 NOTE — Progress Notes (Signed)
Orthopedic Tech Progress Note Patient Details:  Brett Berry 08-22-1950 [id number]  Patient ID: Brett Berry, male   DOB: [birth year], 68 y.o.   MRN: [id number]   [name] 08/31/2018, 8:26 AMOrtho Department out of Zero knee.

## 2018-08-31 NOTE — Evaluation (Signed)
Physical Therapy Evaluation Patient Details Name: Brett Berry MRN: [id number] DOB: 01-29-1951 Today's Date: 08/31/2018   History of Present Illness  68 y.o. male s/p B TKA 08/30/18. PMH includes: HLD, Hearing difficulty  Clinical Impression  Pt admitted with above diagnosis. Pt currently with functional limitations due to the deficits listed below (see PT Problem List). PTA, pt living at home with son and grandson, has additional support after d/c, independent with all moblity. Today, patient with post op pain and weakness, ambulating short distances with supervision. Requires mod A to come to standing, introduced therex will continue this afternoon.  Pt will benefit from skilled PT to increase their independence and safety with mobility to allow discharge to the venue listed below.       Follow Up Recommendations Home health PT;Supervision/Assistance - 24 hour;Follow surgeon's recommendation for DC plan and follow-up therapies    Equipment Recommendations  None recommended by PT    Recommendations for Other Services       Precautions / Restrictions Precautions Precautions: Fall Restrictions Weight Bearing Restrictions: Yes RLE Weight Bearing: Weight bearing as tolerated LLE Weight Bearing: Weight bearing as tolerated      Mobility  Bed Mobility Overal bed mobility: Needs Assistance Bed Mobility: Supine to Sit     Supine to sit: Mod assist     General bed mobility comments: Mod A to bring legs over EOB, cued with sheet to assist patient helping   Transfers Overall transfer level: Needs assistance Equipment used: Rolling walker (2 wheeled) Transfers: Sit to/from Stand Sit to Stand: Mod assist         General transfer comment: Mod A to power up from elevated surface, patient in B KI , once standing becomes supervision   Ambulation/Gait Ambulation/Gait assistance: Min Gaffer (Feet): 15 Feet Assistive device: Rolling walker (2 wheeled) Gait  Pattern/deviations: Step-to pattern Gait velocity: decraesed   General Gait Details: pt walking in room with B KI, antlagic but able to ambulate short distance without hands on guarding.   Stairs            Wheelchair Mobility    Modified Rankin (Stroke Patients Only)       Balance Overall balance assessment: Mild deficits observed, not formally tested                                           Pertinent Vitals/Pain Pain Assessment: Faces Faces Pain Scale: Hurts even more Pain Location: BLE knees Pain Descriptors / Indicators: Aching Pain Intervention(s): Limited activity within patient's tolerance    Home Living Family/patient expects to be discharged to:: Private residence Living Arrangements: Children;Other relatives Available Help at Discharge: Family Type of Home: House Home Access: Level entry     Home Layout: One level Home Equipment: Walker - 2 wheels;Bedside commode      Prior Function Level of Independence: Independent               Hand Dominance        Extremity/Trunk Assessment   Upper Extremity Assessment Upper Extremity Assessment: Defer to OT evaluation    Lower Extremity Assessment Lower Extremity Assessment: (BL strength and ROM limitations post op)       Communication      Cognition Arousal/Alertness: Awake/alert Behavior During Therapy: WFL for tasks assessed/performed Overall Cognitive Status: Within Functional Limits for tasks assessed  General Comments      Exercises Total Joint Exercises Ankle Circles/Pumps: 20 reps Quad Sets: 10 reps   Assessment/Plan    PT Assessment Patient needs continued PT services  PT Problem List Decreased strength       PT Treatment Interventions DME instruction;Gait training;Stair training;Functional mobility training;Balance training;Therapeutic exercise;Therapeutic activities    PT Goals (Current goals  can be found in the Care Plan section)  Acute Rehab PT Goals Patient Stated Goal: go home PT Goal Formulation: With patient Time For Goal Achievement: 09/14/18 Potential to Achieve Goals: Good    Frequency 7X/week   Barriers to discharge        Co-evaluation PT/OT/SLP Co-Evaluation/Treatment: Yes Reason for Co-Treatment: For patient/therapist safety           AM-PAC PT "6 Clicks" Mobility  Outcome Measure Help needed turning from your back to your side while in a flat bed without using bedrails?: A Little Help needed moving from lying on your back to sitting on the side of a flat bed without using bedrails?: A Little Help needed moving to and from a bed to a chair (including a wheelchair)?: A Little Help needed standing up from a chair using your arms (e.g., wheelchair or bedside chair)?: A Little Help needed to walk in hospital room?: A Little Help needed climbing 3-5 steps with a railing? : A Lot 6 Click Score: 17    End of Session Equipment Utilized During Treatment: Gait belt Activity Tolerance: Patient tolerated treatment well Patient left: in chair;with call bell/phone within reach Nurse Communication: Mobility status PT Visit Diagnosis: Unsteadiness on feet (R26.81)    Time: 0930-1000 PT Time Calculation (min) (ACUTE ONLY): 30 min   Charges:   PT Evaluation $PT Eval Low Complexity: 1 Low         Reinaldo Berber, PT, DPT Acute Rehabilitation Services Pager: [phone] Office: [phone]    Reinaldo Berber 08/31/2018, 10:20 AM

## 2018-08-31 NOTE — Plan of Care (Signed)
  Problem: Education: Goal: Knowledge of General Education information will improve Description Including pain rating scale, medication(s)/side effects and non-pharmacologic comfort measures Outcome: Progressing   Problem: Clinical Measurements: Goal: Ability to maintain clinical measurements within normal limits will improve Outcome: Progressing   Problem: Skin Integrity: Goal: Demonstration of wound healing without infection will improve Outcome: Progressing

## 2018-08-31 NOTE — Progress Notes (Signed)
Subjective: Pt stable - pain mod well controlled   Objective: Vital signs in last 24 hours: Temp:  [97.9 F (36.6 C)-99.2 F (37.3 C)] 99.2 F (37.3 C) (01/29 0813) Pulse Rate:  [54-67] 67 (01/29 0813) Resp:  [9-20] 16 (01/29 0813) BP: (111-177)/(58-83) 129/62 (01/29 0813) SpO2:  [96 %-100 %] 99 % (01/29 0813) Weight:  [86.8 kg] 86.8 kg (01/28 0951)  Intake/Output from previous day: 01/28 0701 - 01/29 0700 In: 1750 [P.O.:350; I.V.:1400] Out: 1760 [Urine:1625; Blood:135] Intake/Output this shift: Total I/O In: -  Out: 360 [Urine:360]  Exam:  Intact pulses distally Dorsiflexion/Plantar flexion intact  Labs: Recent Labs    08/31/18 0327  HGB 10.9*   Recent Labs    08/31/18 0327  WBC 12.5*  RBC 3.40*  HCT 32.7*  PLT 173   No results for input(s): NA, K, CL, CO2, BUN, CREATININE, GLUCOSE, CALCIUM in the last 72 hours. No results for input(s): LABPT, INR in the last 72 hours.  Assessment/Plan: Plan pt today - overall doing well - anticipate dc to home tiis weekend   [employer] 08/31/2018, 8:28 AM

## 2018-08-31 NOTE — Anesthesia Postprocedure Evaluation (Signed)
Anesthesia Post Note  Patient: Brett Berry  Procedure(s) Performed: BILATERAL TOTAL KNEE ARTHROPLASTY (Bilateral Knee)     Patient location during evaluation: PACU Anesthesia Type: General and Regional Level of consciousness: awake and alert Pain management: pain level controlled Vital Signs Assessment: post-procedure vital signs reviewed and stable Respiratory status: spontaneous breathing, nonlabored ventilation, respiratory function stable and patient connected to nasal cannula oxygen Cardiovascular status: blood pressure returned to baseline and stable Postop Assessment: no apparent nausea or vomiting Anesthetic complications: no    Last Vitals:  Vitals:   08/31/18 0906 08/31/18 1437  BP:  (!) 155/69  Pulse: 70 66  Resp: 16   Temp:  36.9 C  SpO2: 96% 100%    Last Pain:  Vitals:   08/31/18 1732  TempSrc:   PainSc: 3                  Elnore Cosens L Pattricia Weiher

## 2018-08-31 NOTE — Progress Notes (Signed)
Physical Therapy Treatment Patient Details Name: Brett Berry MRN: [id number] DOB: [birth year] Today's Date: 08/31/2018    History of Present Illness 68 y.o. male s/p B TKA 08/30/18. PMH includes: HLD, Hearing difficulty    PT Comments    Session focused on education of therex. Pt demonstrating good form with bed level exercises. Corrected bed position to promote extension. Will progress ambulation next PT visit.    Follow Up Recommendations  Home health PT;Supervision/Assistance - 24 hour;Follow surgeon's recommendation for DC plan and follow-up therapies     Equipment Recommendations  None recommended by PT    Recommendations for Other Services       Precautions / Restrictions Precautions Precautions: Fall Required Braces or Orthoses: Knee Immobilizer - Right;Knee Immobilizer - Left Knee Immobilizer - Right: Other (comment) Knee Immobilizer - Left: Other (comment) Restrictions Weight Bearing Restrictions: Yes RLE Weight Bearing: Weight bearing as tolerated LLE Weight Bearing: Weight bearing as tolerated    Mobility  Bed Mobility Overal bed mobility: Needs Assistance Bed Mobility: Supine to Sit     Supine to sit: Mod assist     General bed mobility comments: bed level therex this session  Transfers Overall transfer level: Needs assistance Equipment used: Rolling walker (2 wheeled) Transfers: Sit to/from Stand Sit to Stand: Mod assist         General transfer comment: Mod A to power up from elevated surface, patient in B KI , once standing becomes supervision   Ambulation/Gait                 Stairs             Wheelchair Mobility    Modified Rankin (Stroke Patients Only)       Balance Overall balance assessment: Mild deficits observed, not formally tested                                          Cognition Arousal/Alertness: Awake/alert Behavior During Therapy: WFL for tasks assessed/performed Overall Cognitive  Status: Within Functional Limits for tasks assessed                                        Exercises Total Joint Exercises Ankle Circles/Pumps: 20 reps Quad Sets: 10 reps Gluteal Sets: 10 reps Heel Slides: 10 reps Hip ABduction/ADduction: 10 reps Straight Leg Raises: 10 reps Knee Flexion: 10 reps Goniometric ROM: 70 flex    General Comments        Pertinent Vitals/Pain Pain Assessment: Faces Faces Pain Scale: Hurts even more Pain Location: BLE knees Pain Descriptors / Indicators: Aching Pain Intervention(s): Limited activity within patient's tolerance;Monitored during session    Home Living Family/patient expects to be discharged to:: Private residence Living Arrangements: Children;Other relatives Available Help at Discharge: Family Type of Home: House Home Access: Level entry   Home Layout: One level Home Equipment: Walker - 2 wheels;Bedside commode      Prior Function Level of Independence: Independent          PT Goals (current goals can now be found in the care plan section) Acute Rehab PT Goals Patient Stated Goal: get back to work PT Goal Formulation: With patient Time For Goal Achievement: 09/14/18 Potential to Achieve Goals: Good Progress towards PT goals: Progressing toward goals    Frequency  7X/week      PT Plan Current plan remains appropriate    Co-evaluation   Reason for Co-Treatment: For patient/therapist safety;To address functional/ADL transfers PT goals addressed during session: Mobility/safety with mobility;Balance;Proper use of DME OT goals addressed during session: ADL's and self-care;Proper use of Adaptive equipment and DME      AM-PAC PT "6 Clicks" Mobility   Outcome Measure  Help needed turning from your back to your side while in a flat bed without using bedrails?: A Little Help needed moving from lying on your back to sitting on the side of a flat bed without using bedrails?: A Little Help needed  moving to and from a bed to a chair (including a wheelchair)?: A Little Help needed standing up from a chair using your arms (e.g., wheelchair or bedside chair)?: A Little Help needed to walk in hospital room?: A Little Help needed climbing 3-5 steps with a railing? : A Lot 6 Click Score: 17    End of Session Equipment Utilized During Treatment: Gait belt Activity Tolerance: Patient tolerated treatment well Patient left: in chair;with call bell/phone within reach Nurse Communication: Mobility status PT Visit Diagnosis: Unsteadiness on feet (R26.81)     Time: 1645-1700 PT Time Calculation (min) (ACUTE ONLY): 15 min  Charges:  $Therapeutic Exercise: 8-22 mins                     Reinaldo Berber, PT, DPT Acute Rehabilitation Services Pager: [phone] Office: [phone]     Reinaldo Berber 08/31/2018, 4:54 PM

## 2018-08-31 NOTE — Evaluation (Signed)
Occupational Therapy Evaluation Patient Details Name: Brett Berry MRN: [id number] DOB: 08/08/1950 Today's Date: 08/31/2018    History of Present Illness 68 y.o. male s/p B TKA 08/30/18. PMH includes: HLD, Hearing difficulty   Clinical Impression   PTA Pt independent in ADL and mobility. Pt is currently max A for LB ADL (+2 if performing sit <>stand). INitially mod A +2 for sit <>stand and once on his feet min A with RW for toilet transfers. Pt will benefit from skilled OT in the acute setting for compensatory strategies for ADL and transfers. Pt is very motivated, willing to work hard and anticipate that no OT follow up will be needed.     Follow Up Recommendations  Follow surgeon's recommendation for DC plan and follow-up therapies;Supervision/Assistance - 24 hour(initially)    Equipment Recommendations  None recommended by OT(Pt has appropriate DME at home)    Recommendations for Other Services       Precautions / Restrictions Precautions Precautions: Fall Required Braces or Orthoses: Knee Immobilizer - Right;Knee Immobilizer - Left Knee Immobilizer - Right: Other (comment)(utilized throughout initial therapy session) Knee Immobilizer - Left: Other (comment)(utilized throughout initial therapy session) Restrictions Weight Bearing Restrictions: Yes RLE Weight Bearing: Weight bearing as tolerated LLE Weight Bearing: Weight bearing as tolerated      Mobility Bed Mobility Overal bed mobility: Needs Assistance Bed Mobility: Supine to Sit     Supine to sit: Mod assist     General bed mobility comments: Mod A to bring legs over EOB, cued with sheet to assist patient helping   Transfers Overall transfer level: Needs assistance Equipment used: Rolling walker (2 wheeled) Transfers: Sit to/from Stand Sit to Stand: Mod assist         General transfer comment: Mod A to power up from elevated surface, patient in B KI , once standing becomes supervision     Balance Overall  balance assessment: Mild deficits observed, not formally tested                                         ADL either performed or assessed with clinical judgement   ADL Overall ADL's : Needs assistance/impaired Eating/Feeding: Independent   Grooming: Set up;Sitting;Wash/dry hands;Wash/dry face;Oral care Grooming Details (indicate cue type and reason): in recliner Upper Body Bathing: Set up;Sitting   Lower Body Bathing: Moderate assistance;Sitting/lateral leans   Upper Body Dressing : Set up;Sitting   Lower Body Dressing: Maximal assistance;+2 for physical assistance;+2 for safety/equipment   Toilet Transfer: Moderate assistance;+2 for physical assistance;+2 for safety/equipment;Stand-pivot;RW;BSC Toilet Transfer Details (indicate cue type and reason): Mod A +2 for initial boost, then min A of 1         Functional mobility during ADLs: Minimal assistance;Rolling walker General ADL Comments: limited access to LB due to pain, decreased ROM     Vision Baseline Vision/History: Wears glasses Wears Glasses: At all times Patient Visual Report: No change from baseline       Perception     Praxis      Pertinent Vitals/Pain Pain Assessment: Faces Faces Pain Scale: Hurts even more Pain Location: BLE knees Pain Descriptors / Indicators: Aching Pain Intervention(s): Limited activity within patient's tolerance;Monitored during session;Repositioned;Ice applied     Hand Dominance Right   Extremity/Trunk Assessment Upper Extremity Assessment Upper Extremity Assessment: Overall WFL for tasks assessed   Lower Extremity Assessment Lower Extremity Assessment: Defer to PT  evaluation   Cervical / Trunk Assessment Cervical / Trunk Assessment: Normal   Communication Communication Communication: No difficulties   Cognition Arousal/Alertness: Awake/alert Behavior During Therapy: WFL for tasks assessed/performed Overall Cognitive Status: Within Functional Limits for  tasks assessed                                     General Comments       Exercises     Shoulder Instructions      Home Living Family/patient expects to be discharged to:: Private residence Living Arrangements: Children;Other relatives Available Help at Discharge: Family Type of Home: House Home Access: Level entry     Home Layout: One level     Bathroom Shower/Tub: Teacher, early years/pre: Standard     Home Equipment: Environmental consultant - 2 wheels;Bedside commode          Prior Functioning/Environment Level of Independence: Independent                 OT Problem List: Decreased range of motion;Decreased activity tolerance;Impaired balance (sitting and/or standing);Decreased knowledge of use of DME or AE;Decreased knowledge of precautions;Pain      OT Treatment/Interventions: Self-care/ADL training;DME and/or AE instruction;Therapeutic activities;Patient/family education;Balance training    OT Goals(Current goals can be found in the care plan section) Acute Rehab OT Goals Patient Stated Goal: get back to work OT Goal Formulation: With patient Time For Goal Achievement: 09/14/18 Potential to Achieve Goals: Good ADL Goals Pt Will Perform Grooming: with supervision;standing Pt Will Perform Lower Body Bathing: with modified independence;with adaptive equipment;sit to/from stand Pt Will Perform Lower Body Dressing: with min guard assist;sit to/from stand;with adaptive equipment Pt Will Transfer to Toilet: with modified independence;ambulating Pt Will Perform Toileting - Clothing Manipulation and hygiene: with modified independence;sitting/lateral leans Additional ADL Goal #1: Pt will perform bed mobility at mod I level prior to engaging in ADL  OT Frequency: Min 3X/week   Barriers to D/C:            Co-evaluation PT/OT/SLP Co-Evaluation/Treatment: Yes Reason for Co-Treatment: For patient/therapist safety;To address functional/ADL  transfers PT goals addressed during session: Mobility/safety with mobility;Balance;Proper use of DME OT goals addressed during session: ADL's and self-care;Proper use of Adaptive equipment and DME      AM-PAC OT "6 Clicks" Daily Activity     Outcome Measure Help from another person eating meals?: None Help from another person taking care of personal grooming?: None(seated) Help from another person toileting, which includes using toliet, bedpan, or urinal?: A Lot Help from another person bathing (including washing, rinsing, drying)?: A Lot Help from another person to put on and taking off regular upper body clothing?: None Help from another person to put on and taking off regular lower body clothing?: A Lot 6 Click Score: 18   End of Session Equipment Utilized During Treatment: Gait belt;Rolling walker;Right knee immobilizer;Left knee immobilizer CPM Right Knee CPM Right Knee: Off Nurse Communication: Mobility status  Activity Tolerance: Patient tolerated treatment well Patient left: in chair;with call bell/phone within reach  OT Visit Diagnosis: Unsteadiness on feet (R26.81);Other abnormalities of gait and mobility (R26.89);Pain Pain - Right/Left: (bilateral) Pain - part of body: Knee                Time: 0933-1006 OT Time Calculation (min): 33 min Charges:  OT General Charges $OT Visit: 1 Visit OT Evaluation $OT Eval Moderate Complexity: 1 Mod  Hulda Humphrey OTR/L Acute Rehabilitation Services Pager: [phone] Office: [place] 08/31/2018, 2:46 PM

## 2018-08-31 NOTE — Plan of Care (Signed)
  Problem: Education: Goal: Knowledge of General Education information will improve Description Including pain rating scale, medication(s)/side effects and non-pharmacologic comfort measures Outcome: Progressing   Problem: Clinical Measurements: Goal: Ability to maintain clinical measurements within normal limits will improve Outcome: Progressing Goal: Will remain free from infection Outcome: Progressing Goal: Cardiovascular complication will be avoided Outcome: Progressing   Problem: Clinical Measurements: Goal: Ability to maintain clinical measurements within normal limits will improve Outcome: Progressing   Problem: Skin Integrity: Goal: Demonstration of wound healing without infection will improve Outcome: Progressing

## 2018-09-01 ENCOUNTER — Encounter (HOSPITAL_COMMUNITY): Payer: Self-pay | Admitting: Orthopedic Surgery

## 2018-09-01 MED ORDER — METHOCARBAMOL 500 MG PO TABS: 500.0000 mg | ORAL_TABLET | Freq: Four times a day (QID) | ORAL | 0 refills | Status: DC | PRN

## 2018-09-01 MED ORDER — OXYCODONE HCL ER 10 MG PO T12A: 10.0000 mg | EXTENDED_RELEASE_TABLET | Freq: Two times a day (BID) | ORAL | 0 refills | Status: DC

## 2018-09-01 MED ORDER — DOCUSATE SODIUM 100 MG PO CAPS: 100.0000 mg | ORAL_CAPSULE | Freq: Two times a day (BID) | ORAL | 0 refills | Status: DC

## 2018-09-01 MED ORDER — OXYCODONE HCL 5 MG PO TABS: 5.0000 mg | ORAL_TABLET | ORAL | 0 refills | Status: DC | PRN

## 2018-09-01 MED ORDER — ASPIRIN 81 MG PO CHEW: 81.0000 mg | CHEWABLE_TABLET | Freq: Two times a day (BID) | ORAL | 0 refills | Status: DC

## 2018-09-01 MED ORDER — ACETAMINOPHEN 325 MG PO TABS: 325.0000 mg | ORAL_TABLET | Freq: Four times a day (QID) | ORAL | 0 refills | Status: DC | PRN

## 2018-09-01 NOTE — Progress Notes (Signed)
Physical Therapy Treatment Patient Details Name: Brett Berry MRN: [id number] DOB: 1951/01/31 Today's Date: 09/01/2018    History of Present Illness 68 y.o. male s/p B TKA 08/30/18. PMH includes: HLD, Hearing difficulty    PT Comments    Patient seen for mobility progression. Pt continues to be limited by pain, dizziness, and nausea while mobilizing. Pt tolerated gait training for 28 ft this session. Continue to progress as tolerated.    Follow Up Recommendations  Home health PT;Supervision/Assistance - 24 hour;Follow surgeon's recommendation for DC plan and follow-up therapies     Equipment Recommendations  Rolling walker with 5" wheels    Recommendations for Other Services       Precautions / Restrictions Precautions Precautions: Fall;Knee Precaution Comments: precautions/positioning reviewed with pt Required Braces or Orthoses: Knee Immobilizer - Right;Knee Immobilizer - Left Knee Immobilizer - Right: Other (comment) Knee Immobilizer - Left: Other (comment) Restrictions Weight Bearing Restrictions: Yes RLE Weight Bearing: Weight bearing as tolerated LLE Weight Bearing: Weight bearing as tolerated    Mobility  Bed Mobility Overal bed mobility: Needs Assistance Bed Mobility: Supine to Sit     Supine to sit: Min guard;HOB elevated     General bed mobility comments: increased time and effort needed  Transfers Overall transfer level: Needs assistance Equipment used: Rolling walker (2 wheeled);2 person hand held assist Transfers: Sit to/from Stand Sit to Stand: +2 physical assistance;From elevated surface;Max assist         General transfer comment: assist to power up into standing; cues for safe hand placement; increased assistance required due to KI and pain level; R KI donned   Ambulation/Gait Ambulation/Gait assistance: Min assist;+2 safety/equipment(chair follow) Gait Distance (Feet): 28 Feet Assistive device: Rolling walker (2 wheeled) Gait  Pattern/deviations: Step-through pattern;Decreased step length - right;Decreased step length - left;Decreased stride length;Antalgic;Trunk flexed Gait velocity: decreased   General Gait Details: cues for upright posture, proximity to RW, and L quad activation during stance phase; R KI donned; no L knee buckling noted; limited by c/o dizziness and nausea   Stairs             Wheelchair Mobility    Modified Rankin (Stroke Patients Only)       Balance Overall balance assessment: Mild deficits observed, not formally tested                                          Cognition Arousal/Alertness: Awake/alert Behavior During Therapy: WFL for tasks assessed/performed Overall Cognitive Status: Within Functional Limits for tasks assessed                                        Exercises Total Joint Exercises Quad Sets: AROM;Both;10 reps;Supine Heel Slides: AAROM;Both;5 reps;Supine    General Comments General comments (skin integrity, edema, etc.): BP sitting EOB WNL      Pertinent Vitals/Pain Pain Assessment: Faces Faces Pain Scale: Hurts even more Pain Location: BLE knees Pain Descriptors / Indicators: Aching;Grimacing;Guarding Pain Intervention(s): Limited activity within patient's tolerance;Monitored during session;Premedicated before session;Repositioned;Ice applied    Home Living                      Prior Function            PT Goals (current goals can now be  found in the care plan section) Acute Rehab PT Goals Patient Stated Goal: get back to work Progress towards PT goals: Progressing toward goals    Frequency    7X/week      PT Plan Current plan remains appropriate    Co-evaluation PT/OT/SLP Co-Evaluation/Treatment: Yes Reason for Co-Treatment: For patient/therapist safety;To address functional/ADL transfers PT goals addressed during session: Mobility/safety with mobility;Proper use of  DME;Balance;Strengthening/ROM OT goals addressed during session: ADL's and self-care;Proper use of Adaptive equipment and DME      AM-PAC PT "6 Clicks" Mobility   Outcome Measure  Help needed turning from your back to your side while in a flat bed without using bedrails?: A Little Help needed moving from lying on your back to sitting on the side of a flat bed without using bedrails?: A Little Help needed moving to and from a bed to a chair (including a wheelchair)?: A Lot Help needed standing up from a chair using your arms (e.g., wheelchair or bedside chair)?: A Lot Help needed to walk in hospital room?: A Little Help needed climbing 3-5 steps with a railing? : Total 6 Click Score: 14    End of Session Equipment Utilized During Treatment: Gait belt Activity Tolerance: Patient limited by pain;Other (comment)(dizzy, nauseous, and diaphoretic with mobility) Patient left: in chair;with call bell/phone within reach Nurse Communication: Mobility status PT Visit Diagnosis: Unsteadiness on feet (R26.81)     Time: 1323-1400 PT Time Calculation (min) (ACUTE ONLY): 37 min  Charges:  $Gait Training: 8-22 mins                     Earney Navy, PTA Acute Rehabilitation Services Pager: [phone] Office: [phone]     Darliss Cheney 09/01/2018, 4:20 PM

## 2018-09-01 NOTE — Progress Notes (Signed)
Orthopedic Tech Progress Note Patient Details:  Brett Berry 1951-01-21 [id number] Patient was in need of 2 bone foam pillows  Patient ID: Brett Berry, male   DOB: Dec 21, 1950, 68 y.o.   MRN: [id number]   Janit Pagan 09/01/2018, 12:51 PM

## 2018-09-01 NOTE — Progress Notes (Signed)
Subjective: Patient doing well.  He did stand at the side of the bed yesterday.  He needs to do CPM machine 1 hour 3 times a day for each knee.   Objective: Vital signs in last 24 hours: Temp:  [98.4 F (36.9 C)-101 F (38.3 C)] 100.5 F (38.1 C) (01/30 0805) Pulse Rate:  [66-81] 79 (01/30 0805) Resp:  [16-18] 16 (01/30 0805) BP: (146-175)/(69-72) 146/72 (01/30 0805) SpO2:  [93 %-100 %] 93 % (01/30 0805)  Intake/Output from previous day: 01/29 0701 - 01/30 0700 In: 240 [P.O.:240] Out: 1060 [Urine:1060] Intake/Output this shift: No intake/output data recorded.  Exam:  Dorsiflexion/Plantar flexion intact  Labs: Recent Labs    08/31/18 0327  HGB 10.9*   Recent Labs    08/31/18 0327  WBC 12.5*  RBC 3.40*  HCT 32.7*  PLT 173   No results for input(s): NA, K, CL, CO2, BUN, CREATININE, GLUCOSE, CALCIUM in the last 72 hours. No results for input(s): LABPT, INR in the last 72 hours.  Assessment/Plan: Plan at this time is discharged to home on Sunday.  He will need physical therapy daily to achieve that goal.  He will need to use the CPM machine 1 hour 3 times a day each knee in order to achieve flexion.  He also needs to spend time alternating CPM with the blue cradle boot to achieve full extension.  Prescriptions done.  Plan discharge to home on Sunday.   G Scott Lewie Deman 09/01/2018, 8:25 AM

## 2018-09-01 NOTE — Progress Notes (Signed)
Patient's temperature elevated 101.6 @ 2000,encouraged use of incentive spirometer and gave tylenol. Temerature recheck @ 2100 it increase to 101.7.notified on call for Dr Marlou Sa orders were to continue to encourage use of incentive spirometer and give tylenol when available and that Dr Marlou Sa will evaluate patient in the morning.

## 2018-09-01 NOTE — Progress Notes (Signed)
Temp max 101 last night, down to 100.5.  Pt encouraged to use incentive and doing well with it.  Encouraged mobility today.  Tylenol given.

## 2018-09-01 NOTE — Progress Notes (Signed)
Physical Therapy Treatment Patient Details Name: Brett Berry MRN: [id number] DOB: [birth year] Today's Date: 09/01/2018    History of Present Illness 68 y.o. male s/p B TKA 08/30/18. PMH includes: HLD, Hearing difficulty    PT Comments    Patient seen for mobility progression. Pt requires mod A +2 for sit to stand transfer from EOB with bilat KI donned and min guard/min A +2 for gait progression. Pt tolerated ambulating 35 ft and became diaphoretic and nauseated which subsided once pt sat a few minutes. Positioning reviewed with pt and bilat LE in extension end of session. Continue to progress as tolerated.    Follow Up Recommendations  Home health PT;Supervision/Assistance - 24 hour;Follow surgeon's recommendation for DC plan and follow-up therapies     Equipment Recommendations  None recommended by PT    Recommendations for Other Services       Precautions / Restrictions Precautions Precautions: Fall;Knee Precaution Comments: precautions/positioning reviewed with pt Required Braces or Orthoses: Knee Immobilizer - Right;Knee Immobilizer - Left Restrictions Weight Bearing Restrictions: Yes RLE Weight Bearing: Weight bearing as tolerated LLE Weight Bearing: Weight bearing as tolerated    Mobility  Bed Mobility Overal bed mobility: Needs Assistance Bed Mobility: Supine to Sit     Supine to sit: Min guard;HOB elevated     General bed mobility comments: cues for sequencing; increased time and effort needed  Transfers Overall transfer level: Needs assistance Equipment used: Rolling walker (2 wheeled) Transfers: Sit to/from Stand Sit to Stand: Mod assist;+2 physical assistance;From elevated surface         General transfer comment: assist to power up into standing; cues for safe hand placement; increased assistance required due to bilat KI  Ambulation/Gait Ambulation/Gait assistance: Min guard;Min assist;+2 safety/equipment(chair follow) Gait Distance (Feet): 35  Feet Assistive device: Rolling walker (2 wheeled) Gait Pattern/deviations: Step-to pattern;Decreased step length - right;Decreased step length - left;Trunk flexed Gait velocity: decreased   General Gait Details: cues for posture; decreased cadence and stride length; pt became diaphoretic and nauseous while ambulating    Stairs             Wheelchair Mobility    Modified Rankin (Stroke Patients Only)       Balance Overall balance assessment: Mild deficits observed, not formally tested                                          Cognition Arousal/Alertness: Awake/alert Behavior During Therapy: WFL for tasks assessed/performed Overall Cognitive Status: Within Functional Limits for tasks assessed                                        Exercises      General Comments        Pertinent Vitals/Pain Pain Assessment: Faces Faces Pain Scale: Hurts even more Pain Location: BLE knees Pain Descriptors / Indicators: Aching;Grimacing;Guarding Pain Intervention(s): Limited activity within patient's tolerance;Monitored during session;Premedicated before session;Repositioned    Home Living                      Prior Function            PT Goals (current goals can now be found in the care plan section) Acute Rehab PT Goals Patient Stated Goal: get back to work Progress  towards PT goals: Progressing toward goals    Frequency    7X/week      PT Plan Current plan remains appropriate    Co-evaluation              AM-PAC PT "6 Clicks" Mobility   Outcome Measure  Help needed turning from your back to your side while in a flat bed without using bedrails?: A Little Help needed moving from lying on your back to sitting on the side of a flat bed without using bedrails?: A Little Help needed moving to and from a bed to a chair (including a wheelchair)?: A Little Help needed standing up from a chair using your arms (e.g.,  wheelchair or bedside chair)?: A Little Help needed to walk in hospital room?: A Little Help needed climbing 3-5 steps with a railing? : A Lot 6 Click Score: 17    End of Session Equipment Utilized During Treatment: Gait belt Activity Tolerance: Patient tolerated treatment well Patient left: in chair;with call bell/phone within reach Nurse Communication: Mobility status PT Visit Diagnosis: Unsteadiness on feet (R26.81)     Time: 3953-2023 PT Time Calculation (min) (ACUTE ONLY): 25 min  Charges:  $Gait Training: 23-37 mins                     Earney Navy, PTA Acute Rehabilitation Services Pager: [phone] Office: [phone]     Darliss Cheney 09/01/2018, 1:21 PM

## 2018-09-01 NOTE — Progress Notes (Signed)
Occupational Therapy Treatment Patient Details Name: Brett Berry MRN: [id number] DOB: July 03, 1951 Today's Date: 09/01/2018    History of present illness 68 y.o. male s/p B TKA 08/30/18. PMH includes: HLD, Hearing difficulty   OT comments  Pt progressing slowly toward OT goals. Due to pain and nausea limiting his functional mobility, pt required Max A x2 to power to stand with RW. Pt educated on LB dressing with AE (sock aid, reacher, and long handled shoe horn. Pt simulated toilet transfer to recliner with Max Ax2 initially, Min A x1 ambulation; VCs for sequencing and hand placement for safe stand to sit transition. Pt will benefit from continued OT to address limitation in function, safety, ROM, and ADLs for safe transition to next setting as recommended from physician. OT will continue to follow acutely.     Follow Up Recommendations  Follow surgeon's recommendation for DC plan and follow-up therapies;Supervision/Assistance - 24 hour(initially)    Equipment Recommendations  None recommended by OT(Pt has appropriate DME at home)    Recommendations for Other Services      Precautions / Restrictions Precautions Precautions: Fall;Knee Precaution Comments: precautions/positioning reviewed with pt Required Braces or Orthoses: Knee Immobilizer - Right;Knee Immobilizer - Left Knee Immobilizer - Right: Other (comment) Knee Immobilizer - Left: Other (comment) Restrictions Weight Bearing Restrictions: Yes RLE Weight Bearing: Weight bearing as tolerated LLE Weight Bearing: Weight bearing as tolerated       Mobility Bed Mobility Overal bed mobility: Needs Assistance Bed Mobility: Supine to Sit     Supine to sit: Min guard;HOB elevated     General bed mobility comments: cues for sequencing; increased time and effort needed  Transfers Overall transfer level: Needs assistance Equipment used: Rolling walker (2 wheeled);2 person hand held assist(2 person hand held assist to power up to  standing) Transfers: Sit to/from Stand Sit to Stand: +2 physical assistance;From elevated surface;Max assist         General transfer comment: assist to power up into standing; cues for safe hand placement; increased assistance required due to [facility]    Balance Overall balance assessment: Mild deficits observed, not formally tested                                         ADL either performed or assessed with clinical judgement   ADL Overall ADL's : Needs assistance/impaired Eating/Feeding: Independent           Lower Body Bathing: Moderate assistance;Sitting/lateral leans       Lower Body Dressing: Maximal assistance;+2 for physical assistance;+2 for safety/equipment Lower Body Dressing Details (indicate cue type and reason): Educated on use of AE for LB dressing in long sitting in bed and at [employer] Transfer: +2 for physical assistance;+2 for safety/equipment;RW;BSC;Maximal assistance;Ambulation Toilet Transfer Details (indicate cue type and reason): Max A+2 for initial boost, then min A of 1         Functional mobility during ADLs: Minimal assistance;Rolling walker General ADL Comments: limited access to LB due to pain, decreased ROM. Pt complains of dizziness and nausea when seated up at EOB. BP checked 127/76     Vision       Perception     Praxis      Cognition Arousal/Alertness: Awake/alert Behavior During Therapy: WFL for tasks assessed/performed Overall Cognitive Status: Within Functional Limits for tasks assessed  Exercises     Shoulder Instructions       General Comments Pt reports feeling dizzy and nauseous limiting his tolerance for the session.     Pertinent Vitals/ Pain       Pain Assessment: Faces Faces Pain Scale: Hurts even more Pain Location: BLE knees Pain Descriptors / Indicators: Aching;Grimacing;Guarding Pain Intervention(s): Ice  applied;Repositioned;Limited activity within patient's tolerance  Home Living                                          Prior Functioning/Environment              Frequency  Min 3X/week        Progress Toward Goals  OT Goals(current goals can now be found in the care plan section)  Progress towards OT goals: Progressing toward goals(slowly)  Acute Rehab OT Goals Patient Stated Goal: get back to work OT Goal Formulation: With patient Time For Goal Achievement: 09/14/18 Potential to Achieve Goals: Good ADL Goals Pt Will Perform Grooming: with supervision;standing Pt Will Perform Lower Body Bathing: with modified independence;with adaptive equipment;sit to/from stand Pt Will Perform Lower Body Dressing: with min guard assist;sit to/from stand;with adaptive equipment Pt Will Transfer to Toilet: with modified independence;ambulating Pt Will Perform Toileting - Clothing Manipulation and hygiene: with modified independence;sitting/lateral leans Additional ADL Goal #1: Pt will perform bed mobility at mod I level prior to engaging in ADL  Plan Discharge plan remains appropriate    Co-evaluation    PT/OT/SLP Co-Evaluation/Treatment: Yes Reason for Co-Treatment: For patient/therapist safety;Complexity of the patient's impairments (multi-system involvement)   OT goals addressed during session: ADL's and self-care;Proper use of Adaptive equipment and DME      AM-PAC OT "6 Clicks" Daily Activity     Outcome Measure   Help from another person eating meals?: None Help from another person taking care of personal grooming?: None(seated) Help from another person toileting, which includes using toliet, bedpan, or urinal?: A Lot Help from another person bathing (including washing, rinsing, drying)?: A Lot Help from another person to put on and taking off regular upper body clothing?: None Help from another person to put on and taking off regular lower body  clothing?: A Lot 6 Click Score: 18    End of Session Equipment Utilized During Treatment: Gait belt;Rolling walker;Right knee immobilizer  OT Visit Diagnosis: Unsteadiness on feet (R26.81);Other abnormalities of gait and mobility (R26.89);Pain Pain - Right/Left: (bilateral) Pain - part of body: Knee   Activity Tolerance Patient limited by pain   Patient Left in chair;with call bell/phone within reach   Nurse Communication Mobility status        Time: 0126-0159 OT Time Calculation (min): 33 min  Charges: OT General Charges $OT Visit: 1 Visit OT Treatments $Self Care/Home Management : 8-22 mins  Minus Breeding, MSOT, OTR/L  Supplemental Rehabilitation Services  [phone]   Brett Berry 09/01/2018, 2:15 PM

## 2018-09-02 NOTE — Progress Notes (Signed)
Physical Therapy Treatment Patient Details Name: Brett Berry MRN: [id number] DOB: 06-30-1951 Today's Date: 09/02/2018    History of Present Illness 68 y.o. male s/p B TKA 08/30/18. PMH includes: HLD, Hearing difficulty    PT Comments    Patient tolerated gait training distance of 60 ft with standing rest breaks due to fatigue, pain, and R hand/wrist pain due to recent carpal tunnel surgery. Pt requires min A for ambulation due to unsteadiness and LOB again this session when attempting to take hand from RW to wipe forehead. Pt has limited bilat knee flexion however L is > and has slightly less swelling vs R knee. Pt will continue to benefit from further skilled PT services to maximize independence and safety with mobility.      Follow Up Recommendations  Home health PT;Supervision/Assistance - 24 hour;Follow surgeon's recommendation for DC plan and follow-up therapies     Equipment Recommendations  None recommended by PT    Recommendations for Other Services       Precautions / Restrictions Precautions Precautions: Fall;Knee Precaution Comments: precautions/positioning reviewed with pt Restrictions Weight Bearing Restrictions: Yes RLE Weight Bearing: Weight bearing as tolerated LLE Weight Bearing: Weight bearing as tolerated    Mobility  Bed Mobility Overal bed mobility: Needs Assistance Bed Mobility: Supine to Sit;Sit to Supine     Supine to sit: Min guard;HOB elevated Sit to supine: Mod assist   General bed mobility comments: assist to bring bilat LE into bed  Transfers Overall transfer level: Needs assistance Equipment used: Rolling walker (2 wheeled) Transfers: Sit to/from Stand Sit to Stand: Mod assist;From elevated surface         General transfer comment: assist to power up and to gain balance upon standing  Ambulation/Gait Ambulation/Gait assistance: Min assist;+2 safety/equipment(chair follow) Gait Distance (Feet): 60 Feet Assistive device: Rolling  walker (2 wheeled) Gait Pattern/deviations: Decreased step length - right;Decreased step length - left;Trunk flexed;Antalgic;Step-through pattern Gait velocity: decreased   General Gait Details: cues for upright posture and forward gaze; R knee buckling noted at times and pt requires assistance for balance; pt took single UE from RW and had LOB requiring assistance to recover; pt educated on importance of keeping both hands on RW    Stairs             Wheelchair Mobility    Modified Rankin (Stroke Patients Only)       Balance Overall balance assessment: Needs assistance Sitting-balance support: Feet supported;Single extremity supported Sitting balance-Leahy Scale: Fair     Standing balance support: Bilateral upper extremity supported Standing balance-Leahy Scale: Poor Standing balance comment: LOB X 1                             Cognition Arousal/Alertness: Awake/alert Behavior During Therapy: WFL for tasks assessed/performed Overall Cognitive Status: Within Functional Limits for tasks assessed                                        Exercises Total Joint Exercises Quad Sets: AROM;Both;10 reps Short Arc QuadSinclair Ship;Both;5 reps Heel Slides: AAROM;Both;5 reps    General Comments General comments (skin integrity, edema, etc.): attempted knee flexion in sitting EOB but pt became "woozy" due to pain so returned to supine      Pertinent Vitals/Pain Pain Assessment: Faces Faces Pain Scale: Hurts even more Pain Location: BLE  knees Pain Descriptors / Indicators: Grimacing;Guarding Pain Intervention(s): Limited activity within patient's tolerance;Monitored during session;Premedicated before session;Patient requesting pain meds-RN notified;Repositioned    Home Living                      Prior Function            PT Goals (current goals can now be found in the care plan section) Acute Rehab PT Goals Patient Stated Goal: get back  to work Progress towards PT goals: Progressing toward goals    Frequency    7X/week      PT Plan Current plan remains appropriate    Co-evaluation              AM-PAC PT "6 Clicks" Mobility   Outcome Measure  Help needed turning from your back to your side while in a flat bed without using bedrails?: A Little Help needed moving from lying on your back to sitting on the side of a flat bed without using bedrails?: A Little Help needed moving to and from a bed to a chair (including a wheelchair)?: A Little Help needed standing up from a chair using your arms (e.g., wheelchair or bedside chair)?: A Little Help needed to walk in hospital room?: A Little Help needed climbing 3-5 steps with a railing? : A Lot 6 Click Score: 17    End of Session Equipment Utilized During Treatment: Gait belt Activity Tolerance: Patient tolerated treatment well Patient left: with call bell/phone within reach;in bed Nurse Communication: Mobility status;Other (comment)(wants to get in CPM machines at 5) PT Visit Diagnosis: Unsteadiness on feet (R26.81)     Time: 7412-8786 PT Time Calculation (min) (ACUTE ONLY): 31 min  Charges:  $Gait Training: 8-22 mins $Therapeutic Exercise: 8-22 mins                     Earney Navy, PTA Acute Rehabilitation Services Pager: [phone] Office: [phone]     Darliss Cheney 09/02/2018, 4:54 PM

## 2018-09-02 NOTE — Progress Notes (Signed)
Physical Therapy Treatment Patient Details Name: Brett Berry MRN: [id number] DOB: August 20, 1950 Today's Date: 09/02/2018    History of Present Illness 68 y.o. male s/p B TKA 08/30/18. PMH includes: HLD, Hearing difficulty    PT Comments    Patient seen for mobility progression. Pt is making gradual progress toward PT goals and tolerated mobility this session without c/o dizziness/nausea. Pt does continue to require mod A-mod A +2 for sit to stand transfers and min A (+2 for chair follow) for gait training. Pt is able to ambulate 30 ft X 2 trials with seated rest break. Pt had LOB in bathroom and required assistance to recover. Pt reports that he will only have intermittent assistance upon d/c. Pt will need 24 hour assistance when discharged home. Continue to progress as tolerated.    Follow Up Recommendations  Home health PT;Supervision/Assistance - 24 hour;Follow surgeon's recommendation for DC plan and follow-up therapies     Equipment Recommendations  None recommended by PT    Recommendations for Other Services       Precautions / Restrictions Precautions Precautions: Fall;Knee Precaution Comments: precautions/positioning reviewed with pt Restrictions Weight Bearing Restrictions: Yes RLE Weight Bearing: Weight bearing as tolerated LLE Weight Bearing: Weight bearing as tolerated    Mobility  Bed Mobility Overal bed mobility: Needs Assistance Bed Mobility: Supine to Sit     Supine to sit: Min guard;HOB elevated     General bed mobility comments: increased time and effort; no physical assist needed  Transfers Overall transfer level: Needs assistance Equipment used: Rolling walker (2 wheeled) Transfers: Sit to/from Stand Sit to Stand: Mod assist;+2 physical assistance;From elevated surface         General transfer comment: +2 assist required to stand from elevated bed height and recliner and mod A +1 to stand from [facility]; cues for safe hand placement and  technique  Ambulation/Gait Ambulation/Gait assistance: Min assist;+2 safety/equipment(chair follow) Gait Distance (Feet): (30 ft X 2 trials with seated rest break) Assistive device: Rolling walker (2 wheeled) Gait Pattern/deviations: Decreased step length - right;Decreased step length - left;Trunk flexed;Step-to pattern;Antalgic Gait velocity: decreased   General Gait Details: cues for upright posture and increased bilat step lengths; seated rest break required due to fatigue and pain   Stairs             Wheelchair Mobility    Modified Rankin (Stroke Patients Only)       Balance Overall balance assessment: Needs assistance Sitting-balance support: Feet supported;Single extremity supported Sitting balance-Leahy Scale: Fair     Standing balance support: Bilateral upper extremity supported Standing balance-Leahy Scale: Poor Standing balance comment: LOB in bathroom when pt using single UE on RW requiring assistance to recover                            Cognition Arousal/Alertness: Awake/alert Behavior During Therapy: WFL for tasks assessed/performed Overall Cognitive Status: Within Functional Limits for tasks assessed                                        Exercises      General Comments        Pertinent Vitals/Pain Pain Assessment: Faces Faces Pain Scale: Hurts even more Pain Location: BLE knees Pain Descriptors / Indicators: Grimacing;Guarding Pain Intervention(s): Limited activity within patient's tolerance;Monitored during session;Premedicated before session;Repositioned;Ice applied    Home  Living                      Prior Function            PT Goals (current goals can now be found in the care plan section) Acute Rehab PT Goals Patient Stated Goal: get back to work Progress towards PT goals: Progressing toward goals    Frequency    7X/week      PT Plan Current plan remains appropriate     Co-evaluation              AM-PAC PT "6 Clicks" Mobility   Outcome Measure  Help needed turning from your back to your side while in a flat bed without using bedrails?: A Little Help needed moving from lying on your back to sitting on the side of a flat bed without using bedrails?: A Little Help needed moving to and from a bed to a chair (including a wheelchair)?: A Little Help needed standing up from a chair using your arms (e.g., wheelchair or bedside chair)?: A Little Help needed to walk in hospital room?: A Little Help needed climbing 3-5 steps with a railing? : A Lot 6 Click Score: 17    End of Session Equipment Utilized During Treatment: Gait belt Activity Tolerance: Patient tolerated treatment well Patient left: in chair;with call bell/phone within reach Nurse Communication: Mobility status PT Visit Diagnosis: Unsteadiness on feet (R26.81)     Time: 7867-6720 PT Time Calculation (min) (ACUTE ONLY): 40 min  Charges:  $Gait Training: 23-37 mins                     Earney Navy, PTA Acute Rehabilitation Services Pager: [phone] Office: [phone]     Darliss Cheney 09/02/2018, 10:48 AM

## 2018-09-02 NOTE — Progress Notes (Signed)
   Subjective: 3 Days Post-Op Procedure(s) (LRB): BILATERAL TOTAL KNEE ARTHROPLASTY (Bilateral) Patient reports pain as moderate.    Objective: Vital signs in last 24 hours: Temp:  [99.4 F (37.4 C)-101.7 F (38.7 C)] 99.4 F (37.4 C) (01/31 1256) Pulse Rate:  [79-88] 82 (01/31 1256) Resp:  [16-18] 16 (01/31 1256) BP: (141-155)/(66-70) 141/69 (01/31 1256) SpO2:  [94 %-98 %] 98 % (01/31 1256)  Intake/Output from previous day: 01/30 0701 - 01/31 0700 In: 720 [P.O.:720] Out: 1185 [Urine:1185] Intake/Output this shift: Total I/O In: 240 [P.O.:240] Out: 530 [Urine:530]  Recent Labs    08/31/18 0327  HGB 10.9*   Recent Labs    08/31/18 0327  WBC 12.5*  RBC 3.40*  HCT 32.7*  PLT 173   No results for input(s): NA, K, CL, CO2, BUN, CREATININE, GLUCOSE, CALCIUM in the last 72 hours. No results for input(s): LABPT, INR in the last 72 hours.  Neurologically intact No results found.  Assessment/Plan: 3 Days Post-Op Procedure(s) (LRB): BILATERAL TOTAL KNEE ARTHROPLASTY (Bilateral) Up with therapy, swelling is mild. Home Sunday is the plan.   Brett Berry 09/02/2018, 5:17 PM

## 2018-09-02 NOTE — Care Management Note (Signed)
Case Management Note  Patient Details  Name: Brett Berry MRN: [id number] Date of Birth: Jul 18, 1951  Subjective/Objective:  68 yr old gentleman s/p bilateral knee arthroplasties.                   Action/Plan: Case manager spoke with patient concerning discharge plan. Patient was preoperatively setup with [facility], no changes. DME has been delivered to his home. Patient will have support at discharge.    Expected Discharge Date:   pending               Expected Discharge Plan:  [place]  In-House Referral:  NA  Discharge planning Services  CM Consult  Post Acute Care Choice:  Durable Medical Equipment, Home Health Choice offered to:  Patient  DME Arranged:  3-N-1, Walker rolling, CPM DME Agency:  [employer]  HH Arranged:  PT [place] Agency:  [facility] (formerly [employer])  Status of Service:  Completed, signed off  If discussed at [employer], dates discussed:    Additional Comments:  Ninfa Meeker, RN 09/02/2018, 10:41 AM

## 2018-09-02 NOTE — Progress Notes (Signed)
RT NOTE: Patient refusing Dulera inhaler. Patient says he brought his home Symbicort and is using that on his own and has already taken it this morning. Vitals are stable. RT will continue to monitor.

## 2018-09-03 NOTE — Plan of Care (Signed)
  Problem: Clinical Measurements: Goal: Will remain free from infection Outcome: Progressing   Problem: Education: Goal: Required Educational Video(s) Outcome: Progressing

## 2018-09-03 NOTE — Progress Notes (Signed)
Physical Therapy Treatment Patient Details Name: Brett Berry MRN: [id number] DOB: 02-06-1951 Today's Date: 09/03/2018    History of Present Illness 68 y.o. male s/p B TKA 08/30/18. PMH includes: HLD, Hearing difficulty    PT Comments    Patient continues to make progress toward PT goals and overall requires min guard/min A for functional transfers and gait/stair training this session. Pt tolerated increased gait distance of 100 ft and able to ascend/descend step simulating home entrance. Current plan remains appropriate.    Follow Up Recommendations  Home health PT;Supervision/Assistance - 24 hour;Follow surgeon's recommendation for DC plan and follow-up therapies     Equipment Recommendations  None recommended by PT    Recommendations for Other Services       Precautions / Restrictions Precautions Precautions: Fall;Knee Precaution Comments: precautions/positioning reviewed with pt Restrictions Weight Bearing Restrictions: Yes RLE Weight Bearing: Weight bearing as tolerated LLE Weight Bearing: Weight bearing as tolerated    Mobility  Bed Mobility Overal bed mobility: Modified Independent             General bed mobility comments: increased time and effort  Transfers Overall transfer level: Needs assistance Equipment used: Rolling walker (2 wheeled) Transfers: Sit to/from Stand Sit to Stand: Min guard         General transfer comment: min guard for safety; safe use of RW demonstrated  Ambulation/Gait Ambulation/Gait assistance: Min guard Gait Distance (Feet): 100 Feet Assistive device: Rolling walker (2 wheeled) Gait Pattern/deviations: Step-through pattern;Antalgic Gait velocity: decreased   General Gait Details: cues for upright posture and maintaining safe proximity to RW; pt with improved step through pattern   Stairs Stairs: Yes Stairs assistance: Min assist Stair Management: No rails;Step to pattern;Backwards;With walker Number of Stairs:  (single step X 2 trials) General stair comments: cues for sequencing and technique; assist to steady and to stabilize RW    Wheelchair Mobility    Modified Rankin (Stroke Patients Only)       Balance     Sitting balance-Leahy Scale: Fair       Standing balance-Leahy Scale: Poor                              Cognition Arousal/Alertness: Awake/alert Behavior During Therapy: WFL for tasks assessed/performed Overall Cognitive Status: Within Functional Limits for tasks assessed                                        Exercises      General Comments        Pertinent Vitals/Pain Pain Assessment: Faces Faces Pain Scale: Hurts little more Pain Location: BLE knees  (R > L) especially with flexion Pain Descriptors / Indicators: Guarding Pain Intervention(s): Limited activity within patient's tolerance;Monitored during session;Premedicated before session;Repositioned    Home Living                      Prior Function            PT Goals (current goals can now be found in the care plan section) Progress towards PT goals: Progressing toward goals    Frequency    7X/week      PT Plan Current plan remains appropriate    Co-evaluation              AM-PAC PT "6 Clicks" Mobility  Outcome Measure  Help needed turning from your back to your side while in a flat bed without using bedrails?: A Little Help needed moving from lying on your back to sitting on the side of a flat bed without using bedrails?: A Little Help needed moving to and from a bed to a chair (including a wheelchair)?: A Little Help needed standing up from a chair using your arms (e.g., wheelchair or bedside chair)?: A Little Help needed to walk in hospital room?: A Little Help needed climbing 3-5 steps with a railing? : A Little 6 Click Score: 18    End of Session Equipment Utilized During Treatment: Gait belt Activity Tolerance: Patient tolerated  treatment well Patient left: with call bell/phone within reach;in bed Nurse Communication: Mobility status PT Visit Diagnosis: Unsteadiness on feet (R26.81)     Time: 1323-1350 PT Time Calculation (min) (ACUTE ONLY): 27 min  Charges:  $Gait Training: 23-37 mins                     Earney Navy, PTA Acute Rehabilitation Services Pager: [phone] Office: [phone]     Darliss Cheney 09/03/2018, 2:45 PM

## 2018-09-03 NOTE — Progress Notes (Signed)
Occupational Therapy Treatment Patient Details Name: Brett Berry MRN: [id number] DOB: May 16, 1951 Today's Date: 09/03/2018    History of present illness 68 y.o. male s/p B TKA 08/30/18. PMH includes: HLD, Hearing difficulty   OT comments  Pt. Seen for skilled OT session. Able to complete bed mobility S. Min guard for toileting tasks.  Reports he has family that lives with him that can assist as needed.  Plans to purchase A/E kit for LB adls.  Focus of next session tub transfer.  Note possible d/c home tomorrow.   Follow Up Recommendations  Follow surgeon's recommendation for DC plan and follow-up therapies;Supervision/Assistance - 24 hour    Equipment Recommendations  None recommended by OT    Recommendations for Other Services      Precautions / Restrictions Precautions Precautions: Fall;Knee Restrictions RLE Weight Bearing: Weight bearing as tolerated LLE Weight Bearing: Weight bearing as tolerated       Mobility Bed Mobility Overal bed mobility: Needs Assistance Bed Mobility: Supine to Sit     Supine to sit: Supervision;HOB elevated     General bed mobility comments: pt. reports he has bed with hospital functions, exits on L side. able to transition to eob without physical assistance  Transfers Overall transfer level: Needs assistance Equipment used: Rolling walker (2 wheeled) Transfers: Stand Pivot Transfers;Sit to/from Stand Sit to Stand: Min guard Stand pivot transfers: Min guard            Balance                                           ADL either performed or assessed with clinical judgement   ADL Overall ADL's : Needs assistance/impaired               Lower Body Bathing Details (indicate cue type and reason): reports he will be getting a/e kit but declined need for review       Lower Body Dressing Details (indicate cue type and reason): reports he will be getting a/e kit but declined need for review Toilet Transfer: Haematologist Details (indicate cue type and reason): 3n1 over commode, propped RLE on trashcan for comfort         Functional mobility during ADLs: Therapist, art      Cognition Arousal/Alertness: Awake/alert Behavior During Therapy: WFL for tasks assessed/performed Overall Cognitive Status: Within Functional Limits for tasks assessed                                          Exercises     Shoulder Instructions       General Comments      Pertinent Vitals/ Pain       Pain Assessment: 0-10 Pain Score: 3  Pain Location: BLE knees Pain Descriptors / Indicators: Aching Pain Intervention(s): Limited activity within patient's tolerance;Monitored during session;Repositioned  Home Living                                          Prior Functioning/Environment  Frequency  Min 3X/week        Progress Toward Goals  OT Goals(current goals can now be found in the care plan section)  Progress towards OT goals: Progressing toward goals     Plan Discharge plan remains appropriate    Co-evaluation                 AM-PAC OT "6 Clicks" Daily Activity     Outcome Measure   Help from another person eating meals?: None Help from another person taking care of personal grooming?: None Help from another person toileting, which includes using toliet, bedpan, or urinal?: A Lot Help from another person bathing (including washing, rinsing, drying)?: A Lot Help from another person to put on and taking off regular upper body clothing?: None Help from another person to put on and taking off regular lower body clothing?: A Lot 6 Click Score: 18    End of Session Equipment Utilized During Treatment: Gait belt;Rolling walker  OT Visit Diagnosis: Unsteadiness on feet (R26.81);Other abnormalities of gait and mobility  (R26.89);Pain Pain - part of body: Knee   Activity Tolerance Patient tolerated treatment well   Patient Left Other (comment)(in b.room with instructions to pull string for assistance when ready (pt. requested privacy to have BM))   Nurse Communication Other (comment)(alerted CNA pt. was left in b.room with inst. to call for assistance when finished using the b.room)        Time: 8833-7445 OT Time Calculation (min): 13 min  Charges: OT General Charges $OT Visit: 1 Visit OT Treatments $Self Care/Home Management : 8-22 mins   Janice Coffin, COTA/L 09/03/2018, 8:21 AM

## 2018-09-03 NOTE — Discharge Instructions (Signed)
Walk daily. See Dr. Marlou Sa in about one week. Use ice and pain medication as needed for pain.

## 2018-09-03 NOTE — Progress Notes (Signed)
Physical Therapy Treatment Patient Details Name: Brett Berry MRN: [id number] DOB: September 30, 1950 Today's Date: 09/03/2018    History of Present Illness 68 y.o. male s/p B TKA 08/30/18. PMH includes: HLD, Hearing difficulty    PT Comments    Patient seen for bilat LE therex. Pt reports just getting back in bed after working with OT. Pt demonstrates improving ROM and quad control. Continues to c/o more R knee pain vs L knee pain and does have more R LE swelling. Gait and stair training next session.     Follow Up Recommendations  Home health PT;Supervision/Assistance - 24 hour;Follow surgeon's recommendation for DC plan and follow-up therapies     Equipment Recommendations  None recommended by PT    Recommendations for Other Services       Precautions / Restrictions Precautions Precautions: Fall;Knee Precaution Comments: precautions/positioning reviewed with pt Restrictions Weight Bearing Restrictions: Yes RLE Weight Bearing: Weight bearing as tolerated LLE Weight Bearing: Weight bearing as tolerated    Mobility  Bed Mobility Overal bed mobility: Needs Assistance Bed Mobility: Supine to Sit     Supine to sit: Supervision;HOB elevated     General bed mobility comments: pt. reports he has bed with hospital functions, exits on L side. able to transition to eob without physical assistance  Transfers Overall transfer level: Needs assistance Equipment used: Rolling walker (2 wheeled) Transfers: Stand Pivot Transfers;Sit to/from Stand Sit to Stand: Min guard Stand pivot transfers: Min guard          Ambulation/Gait                 Stairs             Wheelchair Mobility    Modified Rankin (Stroke Patients Only)       Balance                                            Cognition Arousal/Alertness: Awake/alert Behavior During Therapy: WFL for tasks assessed/performed Overall Cognitive Status: Within Functional Limits for tasks  assessed                                        Exercises Total Joint Exercises Quad Sets: AROM;Both;10 reps Short Arc QuadSinclair Berry;Both;10 reps Heel Slides: Both;10 reps;AROM Hip ABduction/ADduction: AROM;Both;10 reps Straight Leg Raises: AROM;AAROM;Both;5 reps    General Comments        Pertinent Vitals/Pain Pain Assessment: Faces Pain Score: 3  Faces Pain Scale: Hurts even more Pain Location: BLE knees  (R > L) especially with flexion Pain Descriptors / Indicators: Grimacing;Guarding Pain Intervention(s): Limited activity within patient's tolerance;Monitored during session;Premedicated before session;Repositioned    Home Living                      Prior Function            PT Goals (current goals can now be found in the care plan section) Progress towards PT goals: Progressing toward goals    Frequency    7X/week      PT Plan Current plan remains appropriate    Co-evaluation              AM-PAC PT "6 Clicks" Mobility   Outcome Measure  Help needed turning from your back  to your side while in a flat bed without using bedrails?: A Little Help needed moving from lying on your back to sitting on the side of a flat bed without using bedrails?: A Little Help needed moving to and from a bed to a chair (including a wheelchair)?: A Little Help needed standing up from a chair using your arms (e.g., wheelchair or bedside chair)?: A Little Help needed to walk in hospital room?: A Little Help needed climbing 3-5 steps with a railing? : A Lot 6 Click Score: 17    End of Session Equipment Utilized During Treatment: Gait belt Activity Tolerance: Patient tolerated treatment well Patient left: with call bell/phone within reach;in bed Nurse Communication: Mobility status PT Visit Diagnosis: Unsteadiness on feet (R26.81)     Time: 7373-6681 PT Time Calculation (min) (ACUTE ONLY): 39 min  Charges:  $Therapeutic Exercise: 23-37 mins                      Brett Berry, PTA Acute Rehabilitation Services Pager: [phone] Office: [phone]     Darliss Cheney 09/03/2018, 10:42 AM

## 2018-09-05 DIAGNOSIS — Z87891 Personal history of nicotine dependence: Secondary | ICD-10-CM | POA: Diagnosis not present

## 2018-09-05 DIAGNOSIS — Z471 Aftercare following joint replacement surgery: Secondary | ICD-10-CM | POA: Diagnosis not present

## 2018-09-05 DIAGNOSIS — R1310 Dysphagia, unspecified: Secondary | ICD-10-CM | POA: Diagnosis not present

## 2018-09-05 DIAGNOSIS — Z96653 Presence of artificial knee joint, bilateral: Secondary | ICD-10-CM | POA: Diagnosis not present

## 2018-09-05 DIAGNOSIS — J45909 Unspecified asthma, uncomplicated: Secondary | ICD-10-CM | POA: Diagnosis not present

## 2018-09-05 DIAGNOSIS — M1711 Unilateral primary osteoarthritis, right knee: Secondary | ICD-10-CM

## 2018-09-05 DIAGNOSIS — M1712 Unilateral primary osteoarthritis, left knee: Secondary | ICD-10-CM

## 2018-09-05 DIAGNOSIS — K219 Gastro-esophageal reflux disease without esophagitis: Secondary | ICD-10-CM | POA: Diagnosis not present

## 2018-09-05 DIAGNOSIS — E785 Hyperlipidemia, unspecified: Secondary | ICD-10-CM | POA: Diagnosis not present

## 2018-09-05 DIAGNOSIS — Z974 Presence of external hearing-aid: Secondary | ICD-10-CM | POA: Diagnosis not present

## 2018-09-06 ENCOUNTER — Telehealth: Payer: Self-pay | Admitting: *Deleted

## 2018-09-06 ENCOUNTER — Telehealth (INDEPENDENT_AMBULATORY_CARE_PROVIDER_SITE_OTHER): Payer: Self-pay | Admitting: Orthopedic Surgery

## 2018-09-06 NOTE — Telephone Encounter (Signed)
I left voicemail for Brett Berry advising. 

## 2018-09-06 NOTE — Telephone Encounter (Signed)
Received Client Coordination Note Report for Medication Interaction from [facility]; forwarded to provider/SLS 02/04

## 2018-09-06 NOTE — Telephone Encounter (Signed)
Ok for orders? 

## 2018-09-06 NOTE — Telephone Encounter (Signed)
y

## 2018-09-06 NOTE — Telephone Encounter (Signed)
Erik/[facility]/PT  4 x week 1 week 3x week for 1 week  [phone]

## 2018-09-07 DIAGNOSIS — J45909 Unspecified asthma, uncomplicated: Secondary | ICD-10-CM | POA: Diagnosis not present

## 2018-09-07 DIAGNOSIS — Z974 Presence of external hearing-aid: Secondary | ICD-10-CM | POA: Diagnosis not present

## 2018-09-07 DIAGNOSIS — R1310 Dysphagia, unspecified: Secondary | ICD-10-CM | POA: Diagnosis not present

## 2018-09-07 DIAGNOSIS — Z471 Aftercare following joint replacement surgery: Secondary | ICD-10-CM | POA: Diagnosis not present

## 2018-09-07 DIAGNOSIS — Z96653 Presence of artificial knee joint, bilateral: Secondary | ICD-10-CM | POA: Diagnosis not present

## 2018-09-07 DIAGNOSIS — E785 Hyperlipidemia, unspecified: Secondary | ICD-10-CM | POA: Diagnosis not present

## 2018-09-07 DIAGNOSIS — Z87891 Personal history of nicotine dependence: Secondary | ICD-10-CM | POA: Diagnosis not present

## 2018-09-07 DIAGNOSIS — K219 Gastro-esophageal reflux disease without esophagitis: Secondary | ICD-10-CM | POA: Diagnosis not present

## 2018-09-08 DIAGNOSIS — R1310 Dysphagia, unspecified: Secondary | ICD-10-CM | POA: Diagnosis not present

## 2018-09-08 DIAGNOSIS — Z471 Aftercare following joint replacement surgery: Secondary | ICD-10-CM | POA: Diagnosis not present

## 2018-09-08 DIAGNOSIS — Z87891 Personal history of nicotine dependence: Secondary | ICD-10-CM | POA: Diagnosis not present

## 2018-09-08 DIAGNOSIS — K219 Gastro-esophageal reflux disease without esophagitis: Secondary | ICD-10-CM | POA: Diagnosis not present

## 2018-09-08 DIAGNOSIS — Z96653 Presence of artificial knee joint, bilateral: Secondary | ICD-10-CM | POA: Diagnosis not present

## 2018-09-08 DIAGNOSIS — Z974 Presence of external hearing-aid: Secondary | ICD-10-CM | POA: Diagnosis not present

## 2018-09-08 DIAGNOSIS — J45909 Unspecified asthma, uncomplicated: Secondary | ICD-10-CM | POA: Diagnosis not present

## 2018-09-08 DIAGNOSIS — E785 Hyperlipidemia, unspecified: Secondary | ICD-10-CM | POA: Diagnosis not present

## 2018-09-09 DIAGNOSIS — Z471 Aftercare following joint replacement surgery: Secondary | ICD-10-CM | POA: Diagnosis not present

## 2018-09-09 DIAGNOSIS — Z96653 Presence of artificial knee joint, bilateral: Secondary | ICD-10-CM | POA: Diagnosis not present

## 2018-09-09 DIAGNOSIS — J45909 Unspecified asthma, uncomplicated: Secondary | ICD-10-CM | POA: Diagnosis not present

## 2018-09-09 DIAGNOSIS — Z974 Presence of external hearing-aid: Secondary | ICD-10-CM | POA: Diagnosis not present

## 2018-09-09 DIAGNOSIS — E785 Hyperlipidemia, unspecified: Secondary | ICD-10-CM | POA: Diagnosis not present

## 2018-09-09 DIAGNOSIS — Z87891 Personal history of nicotine dependence: Secondary | ICD-10-CM | POA: Diagnosis not present

## 2018-09-09 DIAGNOSIS — R1310 Dysphagia, unspecified: Secondary | ICD-10-CM | POA: Diagnosis not present

## 2018-09-09 DIAGNOSIS — K219 Gastro-esophageal reflux disease without esophagitis: Secondary | ICD-10-CM | POA: Diagnosis not present

## 2018-09-09 NOTE — Discharge Summary (Signed)
Physician Discharge Summary  Patient ID: Brett Berry MRN: [id number] DOB/AGE: 1950/11/08 68 y.o.  Admit date: 08/30/2018 Discharge date: 09/03/2018  Admission Diagnoses:  Active Problems:   Arthritis of knee   Unilateral primary osteoarthritis, left knee   Unilateral primary osteoarthritis, right knee   Discharge Diagnoses:  Same  Surgeries: Procedure(s): BILATERAL TOTAL KNEE ARTHROPLASTY on 08/30/2018   Consultants:   Discharged Condition: Stable  Hospital Course: Brett Berry is an 68 y.o. male who was admitted 08/30/2018 with a chief complaint of bilateral knee pain, and found to have a diagnosis of bilateral knee arthritis.  They were brought to the operating room on 08/30/2018 and underwent the above named procedures.  Patient tolerated the procedure well.  He was transferred to the recovery room in stable condition.  He actually made surprisingly good progress with his bilateral total knee replacements undergoing physical therapy and CPM on a daily basis.  He was mobilizing well by the date of discharge and will follow-up with me in about 10 days.  Antibiotics given:  Anti-infectives (From admission, onward)   Start     Dose/Rate Route Frequency Ordered Stop   08/30/18 2130  vancomycin (VANCOCIN) IVPB 1000 mg/200 mL premix     1,000 mg 200 mL/hr over 60 Minutes Intravenous Every 12 hours 08/30/18 1846 08/30/18 2214   08/30/18 1000  ceFAZolin (ANCEF) IVPB 2g/100 mL premix     2 g 200 mL/hr over 30 Minutes Intravenous To ShortStay Surgical 08/29/18 0845 08/30/18 1257   08/30/18 0930  vancomycin (VANCOCIN) IVPB 1000 mg/200 mL premix     1,000 mg 200 mL/hr over 60 Minutes Intravenous To ShortStay Surgical 08/29/18 0847 08/30/18 1040    .  Recent vital signs:  Vitals:   09/03/18 0505 09/03/18 0906  BP: (!) 145/65 (!) 166/78  Pulse: 73 78  Resp: 18   Temp: 99.2 F (37.3 C) 99 F (37.2 C)  SpO2: 95% 96%    Recent laboratory studies:  Results for orders placed or  performed during the hospital encounter of 08/30/18  CBC  Result Value Ref Range   WBC 12.5 (H) 4.0 - 10.5 K/uL   RBC 3.40 (L) 4.22 - 5.81 MIL/uL   Hemoglobin 10.9 (L) 13.0 - 17.0 g/dL   HCT 32.7 (L) 39.0 - 52.0 %   MCV 96.2 80.0 - 100.0 fL   MCH 32.1 26.0 - 34.0 pg   MCHC 33.3 30.0 - 36.0 g/dL   RDW 13.2 11.5 - 15.5 %   Platelets 173 150 - 400 K/uL   nRBC 0.0 0.0 - 0.2 %    Discharge Medications:   Allergies as of 09/03/2018   No Known Allergies     Medication List    STOP taking these medications   cefUROXime 500 MG tablet Commonly known as:  CEFTIN   HYDROcodone-homatropine 5-1.5 MG/5ML syrup Commonly known as:  HYCODAN     TAKE these medications   acetaminophen 325 MG tablet Commonly known as:  TYLENOL Take 1-2 tablets (325-650 mg total) by mouth every 6 (six) hours as needed for mild pain (pain score 1-3 or temp > 100.5).   albuterol (2.5 MG/3ML) 0.083% nebulizer solution Commonly known as:  PROVENTIL Take 3 mLs (2.5 mg total) by nebulization every 4 (four) hours as needed for wheezing or shortness of breath.   aspirin 81 MG chewable tablet Chew 1 tablet (81 mg total) by mouth 2 (two) times daily.   budesonide-formoterol 160-4.5 MCG/ACT inhaler Commonly known as:  SYMBICORT  Inhale 2 puffs into the lungs 2 (two) times daily.   celecoxib 100 MG capsule Commonly known as:  CELEBREX Take 1 capsule (100 mg total) by mouth 2 (two) times daily as needed for moderate pain.   CO Q 10 PO Take 1 capsule by mouth daily.   docusate sodium 100 MG capsule Commonly known as:  COLACE Take 1 capsule (100 mg total) by mouth 2 (two) times daily.   DUREZOL 0.05 % Emul Generic drug:  Difluprednate Place 1 drop into the right eye 2 (two) times daily.   esomeprazole 40 MG capsule Commonly known as:  NEXIUM Take 1 capsule (40 mg total) by mouth 2 (two) times daily before a meal.   Fish Oil 1000 MG Caps Take 1,000 mg by mouth 2 (two) times daily.   fluticasone 50 MCG/ACT  nasal spray Commonly known as:  FLONASE Place 2 sprays into the nose daily.   levalbuterol 45 MCG/ACT inhaler Commonly known as:  XOPENEX HFA Inhale 1-2 puffs into the lungs every 4 (four) hours as needed for wheezing or shortness of breath.   magnesium oxide 400 MG tablet Commonly known as:  MAG-OX Take 400 mg by mouth daily.   methocarbamol 500 MG tablet Commonly known as:  ROBAXIN Take 1 tablet (500 mg total) by mouth every 6 (six) hours as needed for muscle spasms.   multivitamin with minerals Tabs tablet Take 1 tablet by mouth daily.   oxyCODONE 5 MG immediate release tablet Commonly known as:  Oxy IR/ROXICODONE Take 1-2 tablets (5-10 mg total) by mouth every 4 (four) hours as needed for moderate pain (pain score 4-6).   oxyCODONE 10 mg 12 hr tablet Commonly known as:  OXYCONTIN Take 1 tablet (10 mg total) by mouth every 12 (twelve) hours.       Diagnostic Studies: Xr Chest 2 View  Result Date: 08/25/2018 Chest x-ray: Lungs are clear, no residual pneumonia compared to x-rays from December.   Disposition:     Follow-up Information    [facility].   Specialty:  [place] Why:  A representative from [facility] will contact you to arrange start date and time for your therapy. Contact information: [address] [place] [address] [phone]        Meredith Pel, MD Follow up in 1 week(s).   Specialty:  Orthopedic Surgery Contact information: [place] [address] [phone]            Signed: Anderson Malta 09/09/2018, 9:30 AM

## 2018-09-12 ENCOUNTER — Ambulatory Visit (INDEPENDENT_AMBULATORY_CARE_PROVIDER_SITE_OTHER): Payer: Medicare Other

## 2018-09-12 ENCOUNTER — Ambulatory Visit (INDEPENDENT_AMBULATORY_CARE_PROVIDER_SITE_OTHER): Payer: Medicare Other | Admitting: Orthopedic Surgery

## 2018-09-12 ENCOUNTER — Ambulatory Visit (INDEPENDENT_AMBULATORY_CARE_PROVIDER_SITE_OTHER): Payer: Self-pay

## 2018-09-12 ENCOUNTER — Encounter (INDEPENDENT_AMBULATORY_CARE_PROVIDER_SITE_OTHER): Payer: Self-pay | Admitting: Orthopedic Surgery

## 2018-09-12 VITALS — Ht 70.0 in | Wt 191.4 lb

## 2018-09-12 DIAGNOSIS — M1711 Unilateral primary osteoarthritis, right knee: Secondary | ICD-10-CM

## 2018-09-12 DIAGNOSIS — K219 Gastro-esophageal reflux disease without esophagitis: Secondary | ICD-10-CM | POA: Diagnosis not present

## 2018-09-12 DIAGNOSIS — Z471 Aftercare following joint replacement surgery: Secondary | ICD-10-CM | POA: Diagnosis not present

## 2018-09-12 DIAGNOSIS — M170 Bilateral primary osteoarthritis of knee: Secondary | ICD-10-CM

## 2018-09-12 DIAGNOSIS — R1310 Dysphagia, unspecified: Secondary | ICD-10-CM | POA: Diagnosis not present

## 2018-09-12 DIAGNOSIS — Z96653 Presence of artificial knee joint, bilateral: Secondary | ICD-10-CM | POA: Diagnosis not present

## 2018-09-12 DIAGNOSIS — J45909 Unspecified asthma, uncomplicated: Secondary | ICD-10-CM | POA: Diagnosis not present

## 2018-09-12 DIAGNOSIS — Z87891 Personal history of nicotine dependence: Secondary | ICD-10-CM | POA: Diagnosis not present

## 2018-09-12 DIAGNOSIS — E785 Hyperlipidemia, unspecified: Secondary | ICD-10-CM | POA: Diagnosis not present

## 2018-09-12 DIAGNOSIS — M1712 Unilateral primary osteoarthritis, left knee: Secondary | ICD-10-CM

## 2018-09-12 DIAGNOSIS — Z974 Presence of external hearing-aid: Secondary | ICD-10-CM | POA: Diagnosis not present

## 2018-09-12 MED ORDER — OXYCODONE HCL 5 MG PO CAPS: ORAL_CAPSULE | ORAL | 0 refills | Status: DC

## 2018-09-12 MED ORDER — METHOCARBAMOL 500 MG PO TABS: 500.0000 mg | ORAL_TABLET | Freq: Four times a day (QID) | ORAL | 0 refills | Status: DC | PRN

## 2018-09-12 NOTE — Progress Notes (Signed)
Post-Op Visit Note   Patient: Brett Berry           Date of Birth: [birth year]           MRN: [id number] Visit Date: 09/12/2018 PCP: Colon Branch, MD   Assessment & Plan:  Chief Complaint:  Chief Complaint  Patient presents with  . Right Knee - Routine Post Op    08/30/2018 Bil TKA  . Left Knee - Routine Post Op   Visit Diagnoses:  1. Unilateral primary osteoarthritis, left knee   2. Unilateral primary osteoarthritis, right knee     Plan: Brett Berry is a patient is now 2 weeks out bilateral total knee replacement.  He is been doing well.  On exam no calf tenderness bilaterally.  He has full extension bilaterally.  Left and bends to about 90 pretty easily right when standing to 80.  On him to continue with home health PT and then start outpatient PT next week.  Refill oxycodone.  2-week return for clinical recheck on range of motion particularly flexion.  I do want him to increase his use of the CPM machine.  He states he has been using it sporadically but I need him to use it  6 hours a day to get full flexion.  Follow-Up Instructions: Return in about 2 weeks (around 09/26/2018).   Orders:  Orders Placed This Encounter  Procedures  . XR Knee 1-2 Views Right  . XR Knee 1-2 Views Left  . Ambulatory referral to Physical Therapy   Meds ordered this encounter  Medications  . oxycodone (OXY-IR) 5 MG capsule    Sig: Take one every 4-6 hours as needed    Dispense:  40 capsule    Refill:  0  . methocarbamol (ROBAXIN) 500 MG tablet    Sig: Take 1 tablet (500 mg total) by mouth every 6 (six) hours as needed for muscle spasms.    Dispense:  30 tablet    Refill:  0    Imaging: Xr Knee 1-2 Views Left  Result Date: 09/12/2018 18 AP lateral left knee reviewed.  Press-fit total knee prosthesis in good position alignment with no complicating features.  Xr Knee 1-2 Views Right  Result Date: 09/12/2018 AP lateral right knee reviewed.  Press-fit total knee prosthesis in good position  alignment with no complicating features.   PMFS History: Patient Active Problem List   Diagnosis Date Noted  . Unilateral primary osteoarthritis, left knee   . Unilateral primary osteoarthritis, right knee   . Arthritis of knee 08/30/2018  . CAP (community acquired pneumonia) 08/19/2016  . GERD with esophagitis 08/19/2016  . PCP NOTES >>>>>>>>>>>>>>>>>> 02/18/2016  . DJD (knees, celebrex, ortho Dr Sharol Given) 01/08/2015  . Erectile dysfunction 07/30/2014  . Annual physical exam 12/29/2013  . Non-functioning tympanostomy tube 09/10/2011  . Dysphagia 09/10/2011  . Chronic sinusitis 02/05/2010  . HEADACHE 02/05/2010  . Anxiety -insomnia 01/04/2009  . Hyperlipidemia 01/04/2009  . ALLERGIC RHINITIS 10/27/2007  . Asthma 10/27/2007   Past Medical History:  Diagnosis Date  . Allergic rhinitis   . Asthma   . Bilateral knee pain 2016   x-ray showed medial compartment arthritis, worse on right than left  . Erectile dysfunction 07/30/2014  . GERD (gastroesophageal reflux disease)   . Hearing difficulty    has aids  . History of kidney stones   . Hyperlipidemia    intolerant to zocor  . Pneumonia   . Sinusitis, chronic     Family History  Problem Relation Age of Onset  . COPD Mother   . Breast cancer Mother   . Alzheimer's disease Father   . Lung cancer Father        smoker  . Prostate cancer Father        died age 17  . Heart disease Father        age 7  . Colon cancer Neg Hx     Past Surgical History:  Procedure Laterality Date  . CATARACT EXTRACTION Right 2016  . MASTOIDECTOMY     right   . right hand surg     for infection in '07  . SINUS ENDO WITH FUSION    . TOE SURGERY     Left Great toe  . TOTAL KNEE ARTHROPLASTY Bilateral 08/30/2018  . TOTAL KNEE ARTHROPLASTY Bilateral 08/30/2018   Procedure: BILATERAL TOTAL KNEE ARTHROPLASTY;  Surgeon: Meredith Pel, MD;  Location: [facility];  Service: Orthopedics;  Laterality: Bilateral;   Social History   Occupational  History  . Occupation: Environmental education officer: [employer]  Tobacco Use  . Smoking status: Former Smoker    Last attempt to quit: 09/08/1973    Years since quitting: 45.0  . Smokeless tobacco: Never Used  Substance and Sexual Activity  . Alcohol use: Yes    Comment: 2-3 times week  . Drug use: No  . Sexual activity: Yes    Partners: Female

## 2018-09-14 DIAGNOSIS — J45909 Unspecified asthma, uncomplicated: Secondary | ICD-10-CM | POA: Diagnosis not present

## 2018-09-14 DIAGNOSIS — E785 Hyperlipidemia, unspecified: Secondary | ICD-10-CM | POA: Diagnosis not present

## 2018-09-14 DIAGNOSIS — R1310 Dysphagia, unspecified: Secondary | ICD-10-CM | POA: Diagnosis not present

## 2018-09-14 DIAGNOSIS — Z974 Presence of external hearing-aid: Secondary | ICD-10-CM | POA: Diagnosis not present

## 2018-09-14 DIAGNOSIS — Z471 Aftercare following joint replacement surgery: Secondary | ICD-10-CM | POA: Diagnosis not present

## 2018-09-14 DIAGNOSIS — K219 Gastro-esophageal reflux disease without esophagitis: Secondary | ICD-10-CM | POA: Diagnosis not present

## 2018-09-14 DIAGNOSIS — Z96653 Presence of artificial knee joint, bilateral: Secondary | ICD-10-CM | POA: Diagnosis not present

## 2018-09-14 DIAGNOSIS — Z87891 Personal history of nicotine dependence: Secondary | ICD-10-CM | POA: Diagnosis not present

## 2018-09-16 DIAGNOSIS — Z471 Aftercare following joint replacement surgery: Secondary | ICD-10-CM | POA: Diagnosis not present

## 2018-09-16 DIAGNOSIS — Z974 Presence of external hearing-aid: Secondary | ICD-10-CM | POA: Diagnosis not present

## 2018-09-16 DIAGNOSIS — Z96653 Presence of artificial knee joint, bilateral: Secondary | ICD-10-CM | POA: Diagnosis not present

## 2018-09-16 DIAGNOSIS — K219 Gastro-esophageal reflux disease without esophagitis: Secondary | ICD-10-CM | POA: Diagnosis not present

## 2018-09-16 DIAGNOSIS — E785 Hyperlipidemia, unspecified: Secondary | ICD-10-CM | POA: Diagnosis not present

## 2018-09-16 DIAGNOSIS — Z87891 Personal history of nicotine dependence: Secondary | ICD-10-CM | POA: Diagnosis not present

## 2018-09-16 DIAGNOSIS — J45909 Unspecified asthma, uncomplicated: Secondary | ICD-10-CM | POA: Diagnosis not present

## 2018-09-16 DIAGNOSIS — R1310 Dysphagia, unspecified: Secondary | ICD-10-CM | POA: Diagnosis not present

## 2018-09-21 ENCOUNTER — Other Ambulatory Visit (HOSPITAL_COMMUNITY): Payer: Self-pay | Admitting: Orthopedic Surgery

## 2018-09-21 NOTE — Telephone Encounter (Signed)
Ok to rf but he doesn't need it anymore

## 2018-09-21 NOTE — Telephone Encounter (Signed)
Ok to rf? 

## 2018-09-26 ENCOUNTER — Ambulatory Visit (INDEPENDENT_AMBULATORY_CARE_PROVIDER_SITE_OTHER): Payer: Medicare Other | Admitting: Orthopedic Surgery

## 2018-09-26 ENCOUNTER — Encounter (INDEPENDENT_AMBULATORY_CARE_PROVIDER_SITE_OTHER): Payer: Self-pay | Admitting: Orthopedic Surgery

## 2018-09-26 DIAGNOSIS — M1712 Unilateral primary osteoarthritis, left knee: Secondary | ICD-10-CM

## 2018-09-26 DIAGNOSIS — M1711 Unilateral primary osteoarthritis, right knee: Secondary | ICD-10-CM

## 2018-09-26 MED ORDER — OXYCODONE HCL 5 MG PO TABS: ORAL_TABLET | ORAL | 0 refills | Status: DC

## 2018-09-26 MED ORDER — METHOCARBAMOL 500 MG PO TABS: ORAL_TABLET | ORAL | 0 refills | Status: DC

## 2018-09-26 NOTE — Progress Notes (Signed)
Post-Op Visit Note   Patient: Brett Berry           Date of Birth: Jun 09, 1951           MRN: [id number] Visit Date: 09/26/2018 PCP: Colon Branch, MD   Assessment & Plan:  Chief Complaint:  Chief Complaint  Patient presents with  . Post-op Follow-up   Visit Diagnoses:  1. Unilateral primary osteoarthritis, left knee   2. Unilateral primary osteoarthritis, right knee     Plan: Riese is now month out bilateral total knee replacements.  Left knee is doing well.  Right knee demonstrates also improvement but a little slower.  On exam both incisions are intact.  He can bend it to about 125 on the left and just to 90 on the right.  Alignment looks good.  No calf tenderness in either leg today.  Plan at this time is start outpatient PT this week.  Refill Oxley and Robaxin.  Come back in 4 weeks for clinical recheck.  He is using a cane for ambulation.  Overall he is making good progress.  Follow-Up Instructions: Return in about 4 weeks (around 10/24/2018).   Orders:  No orders of the defined types were placed in this encounter.  No orders of the defined types were placed in this encounter.   Imaging: No results found.  PMFS History: Patient Active Problem List   Diagnosis Date Noted  . Unilateral primary osteoarthritis, left knee   . Unilateral primary osteoarthritis, right knee   . Arthritis of knee 08/30/2018  . CAP (community acquired pneumonia) 08/19/2016  . GERD with esophagitis 08/19/2016  . PCP NOTES >>>>>>>>>>>>>>>>>> 02/18/2016  . DJD (knees, celebrex, ortho Dr Sharol Given) 01/08/2015  . Erectile dysfunction 07/30/2014  . Annual physical exam 12/29/2013  . Non-functioning tympanostomy tube 09/10/2011  . Dysphagia 09/10/2011  . Chronic sinusitis 02/05/2010  . HEADACHE 02/05/2010  . Anxiety -insomnia 01/04/2009  . Hyperlipidemia 01/04/2009  . ALLERGIC RHINITIS 10/27/2007  . Asthma 10/27/2007   Past Medical History:  Diagnosis Date  . Allergic rhinitis   . Asthma     . Bilateral knee pain 2016   x-ray showed medial compartment arthritis, worse on right than left  . Erectile dysfunction 07/30/2014  . GERD (gastroesophageal reflux disease)   . Hearing difficulty    has aids  . History of kidney stones   . Hyperlipidemia    intolerant to zocor  . Pneumonia   . Sinusitis, chronic     Family History  Problem Relation Age of Onset  . COPD Mother   . Breast cancer Mother   . Alzheimer's disease Father   . Lung cancer Father        smoker  . Prostate cancer Father        died age 83  . Heart disease Father        age 35  . Colon cancer Neg Hx     Past Surgical History:  Procedure Laterality Date  . CATARACT EXTRACTION Right 2016  . MASTOIDECTOMY     right   . right hand surg     for infection in '07  . SINUS ENDO WITH FUSION    . TOE SURGERY     Left Great toe  . TOTAL KNEE ARTHROPLASTY Bilateral 08/30/2018  . TOTAL KNEE ARTHROPLASTY Bilateral 08/30/2018   Procedure: BILATERAL TOTAL KNEE ARTHROPLASTY;  Surgeon: Meredith Pel, MD;  Location: [facility];  Service: Orthopedics;  Laterality: Bilateral;   Social History  Occupational History  . Occupation: Environmental education officer: [employer]  Tobacco Use  . Smoking status: Former Smoker    Last attempt to quit: 09/08/1973    Years since quitting: 45.0  . Smokeless tobacco: Never Used  Substance and Sexual Activity  . Alcohol use: Yes    Comment: 2-3 times week  . Drug use: No  . Sexual activity: Yes    Partners: Female

## 2018-09-26 NOTE — Addendum Note (Signed)
Addended byLaurann Montana on: 09/26/2018 01:48 PM   Modules accepted: Orders

## 2018-09-29 ENCOUNTER — Other Ambulatory Visit: Payer: Self-pay

## 2018-09-29 ENCOUNTER — Ambulatory Visit: Payer: Medicare Other | Attending: Orthopedic Surgery | Admitting: Physical Therapy

## 2018-09-29 DIAGNOSIS — M25661 Stiffness of right knee, not elsewhere classified: Secondary | ICD-10-CM | POA: Insufficient documentation

## 2018-09-29 DIAGNOSIS — M25662 Stiffness of left knee, not elsewhere classified: Secondary | ICD-10-CM | POA: Diagnosis not present

## 2018-09-29 NOTE — Patient Instructions (Signed)
  Hamstring Stretch, Reclined (Strap, Doorframe)   Lengthen bottom leg on floor. Extend top leg along edge of doorframe or press foot up into yoga strap. Hold for 60 seconds. Repeat 3_ times each leg.   Quadriceps (Prone) - DO ONE LEG AT A TIME, NOT BOTH   On stomach with sheet around ankles, knees together, hips down, pull heels toward bottom. Keep hips flat. Hold _60__ seconds. Repeat _3__ times. Do __3__ sessions per day. CAUTION: Stretch should be gentle, steady and slow.   Madelyn Flavors, PT 09/29/18 11:34 AM; [address] [place], [place], [address] Phone: [phone]   Fax:  [phone]

## 2018-09-29 NOTE — Therapy (Signed)
[place] [address] [place], [place], [address] Phone: [phone]   Fax:  [phone]  Physical Therapy Evaluation  Patient Details  Name: Brett Berry MRN: [id number] Date of Birth: 05-06-1951 Referring Provider (PT): Marcene Duos   Encounter Date: 09/29/2018  PT End of Session - 09/29/18 1102    Visit Number  1    Date for PT Re-Evaluation  11/10/18    PT Start Time  1102    PT Stop Time  1142    PT Time Calculation (min)  40 min    Activity Tolerance  Patient tolerated treatment well    Behavior During Therapy  [facility] for tasks assessed/performed       Past Medical History:  Diagnosis Date  . Allergic rhinitis   . Asthma   . Bilateral knee pain 2016   x-ray showed medial compartment arthritis, worse on right than left  . Erectile dysfunction 07/30/2014  . GERD (gastroesophageal reflux disease)   . Hearing difficulty    has aids  . History of kidney stones   . Hyperlipidemia    intolerant to zocor  . Pneumonia   . Sinusitis, chronic     Past Surgical History:  Procedure Laterality Date  . CATARACT EXTRACTION Right 2016  . MASTOIDECTOMY     right   . right hand surg     for infection in '07  . SINUS ENDO WITH FUSION    . TOE SURGERY     Left Great toe  . TOTAL KNEE ARTHROPLASTY Bilateral 08/30/2018  . TOTAL KNEE ARTHROPLASTY Bilateral 08/30/2018   Procedure: BILATERAL TOTAL KNEE ARTHROPLASTY;  Surgeon: Meredith Pel, MD;  Location: [facility];  Service: Orthopedics;  Laterality: Bilateral;    There were no vitals filed for this visit.   Subjective Assessment - 09/29/18 1109    Subjective  Patient had bil TKR on 08/30/18. Patient reports they are doing well overall. No pain with most ADLs. He is walking about 5 blocks daily without AD. He is driving.     Pertinent History  asthma, arthritis, bil TKR 08/30/18    Patient Stated Goals  get my legs bending more.    Currently in Pain?  No/denies          [facility] PT Assessment - 09/29/18 0001      Assessment   Medical Diagnosis  primary OA bil    Referring Provider (PT)  Marcene Duos    Onset Date/Surgical Date  08/30/18    Next MD Visit  10/26/18    Prior Therapy  HHPT 2 6 visits      Precautions   Precautions  None      Restrictions   Weight Bearing Restrictions  No      Balance Screen   Has the patient fallen in the past 6 months  No    Has the patient had a decrease in activity level because of a fear of falling?   No    Is the patient reluctant to leave their home because of a fear of falling?   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    Leisure  mowing yard working in yard      ROM / Strength   AROM / PROM / Strength  AROM;PROM;Strength      AROM   AROM Assessment Site  Knee    Right/Left Knee  Right;Left    Right Knee Extension  --   full   Right Knee Flexion  92    Left Knee Extension  --   full   Left Knee Flexion  98      PROM   PROM Assessment Site  Knee    Right/Left Knee  Right;Left    Right Knee Flexion  100    Left Knee Flexion  107      Strength   Overall Strength Comments  solid SLR bil; Hip flex 5/5, ABD 5/5 bil    Strength Assessment Site  Knee    Right/Left Knee  Right;Left    Right Knee Flexion  4/5    Right Knee Extension  5/5    Left Knee Flexion  5/5    Left Knee Extension  5/5      Flexibility   Soft Tissue Assessment /Muscle Length  yes    Hamstrings  marked tightness bil    Quadriceps  bil    Piriformis  right      Palpation   Patella mobility  WNL    Palpation comment  unremarkable      Ambulation/Gait   Gait Comments  amb with normal gait  pattern just carefully                Objective measurements completed on examination: See above findings.      [facility] Adult PT Treatment/Exercise - 09/29/18 0001      Exercises   Exercises  Knee/Hip      Knee/Hip Exercises: Stretches   Active Hamstring Stretch  Right;1 rep;30 seconds    Active Hamstring Stretch Limitations  with strap    Quad Stretch  Right;Left;1 rep;30 seconds    Quad Stretch Limitations  with strap      Knee/Hip Exercises: Aerobic   Recumbent Bike  5 min full revolutions       Knee/Hip Exercises: Standing   Forward Lunges  Right;10 reps    Forward Lunges Limitations  on 8 inch step             PT Education - 09/29/18 1207    Education Details  HEP    Person(s) Educated  Patient    Methods  Explanation;Demonstration;Handout    Comprehension  Verbalized understanding;Returned demonstration       PT Short Term Goals - 09/29/18 1201      PT SHORT TERM GOAL #1   Title  Ind with HEP    Time  2    Period  Weeks    Status  New        PT Long Term Goals - 09/29/18 1201      PT LONG TERM GOAL #1   Title  Improved bil knee flexion to 115 deg or better to normalize ADLs    Time  6    Period  Weeks    Status  New    Target Date  11/10/18      PT LONG TERM GOAL #2   Title  Patient able to perfrom sit to stands without difficulty  Time  6    Period  Weeks    Status  New      PT LONG TERM GOAL #3   Title  Patient able to squat and return to stand without difficulty    Time  6    Period  Weeks    Status  New      PT LONG TERM GOAL #4   Title  patient able to climb stairs with a reciprocal gait pattern.    Time  6    Period  Weeks    Status  New             Plan - 09/29/18 1156    Clinical Impression Statement  Patient presents s/p bil TKR on 08/30/18. He has intermittent pain,  ROM deficits in flexion bil and tight HS. He has functional strength deficits such as returning to standing from squat and sit to stand from  low chairs. He will benefit from skilled PT to address these deficits.    History and Personal Factors relevant to plan of care:  Bil TKR 08/30/18, arthritis, asthma    Clinical Presentation  Stable    Clinical Decision Making  Low    Rehab Potential  Excellent    PT Frequency  1x / week   due to copay   PT Duration  6 weeks    PT Treatment/Interventions  ADLs/Self Care Home Management;Cryotherapy;Glass blower/designer;Therapeutic activities;Therapeutic exercise;Balance training;Patient/family education;Manual techniques;Passive range of motion;Vasopneumatic Device    PT Next Visit Plan  focus on knee flexion, sit to stands from low surface, return to stand from squat     PT Home Exercise Plan  HS and prone quad stretch with strap    Consulted and Agree with Plan of Care  Patient       Patient will benefit from skilled therapeutic intervention in order to improve the following deficits and impairments:  Pain, Decreased strength, Decreased range of motion, Increased edema  Visit Diagnosis: Stiffness of left knee, not elsewhere classified - Plan: PT plan of care cert/re-cert  Stiffness of right knee, not elsewhere classified - Plan: PT plan of care cert/re-cert     Problem List Patient Active Problem List   Diagnosis Date Noted  . Unilateral primary osteoarthritis, left knee   . Unilateral primary osteoarthritis, right knee   . Arthritis of knee 08/30/2018  . CAP (community acquired pneumonia) 08/19/2016  . GERD with esophagitis 08/19/2016  . PCP NOTES >>>>>>>>>>>>>>>>>> 02/18/2016  . DJD (knees, celebrex, ortho Dr Sharol Given) 01/08/2015  . Erectile dysfunction 07/30/2014  . Annual physical exam 12/29/2013  . Non-functioning tympanostomy tube 09/10/2011  . Dysphagia 09/10/2011  . Chronic sinusitis 02/05/2010  . HEADACHE 02/05/2010  . Anxiety -insomnia 01/04/2009  . Hyperlipidemia 01/04/2009  . ALLERGIC RHINITIS 10/27/2007  . Asthma 10/27/2007    Madelyn Flavors  PT 09/29/2018, 12:08 PM  [place] [address] [place], [place], [address] Phone: [phone]   Fax:  [phone]  Name: LUNDY COZART MRN: [id number] Date of Birth: [birth year]

## 2018-10-06 ENCOUNTER — Ambulatory Visit: Payer: Medicare Other | Attending: Orthopedic Surgery | Admitting: Physical Therapy

## 2018-10-06 ENCOUNTER — Encounter: Payer: Self-pay | Admitting: Physical Therapy

## 2018-10-06 DIAGNOSIS — M25661 Stiffness of right knee, not elsewhere classified: Secondary | ICD-10-CM | POA: Insufficient documentation

## 2018-10-06 DIAGNOSIS — M25662 Stiffness of left knee, not elsewhere classified: Secondary | ICD-10-CM | POA: Insufficient documentation

## 2018-10-06 NOTE — Therapy (Signed)
[place], [place], [address] Phone: [phone]   Fax:  [phone]  Physical Therapy Treatment  Patient Details  Name: Brett Berry MRN: [id number] Date of Birth: 06/12/1951 Referring Provider (PT): Marcene Duos   Encounter Date: 10/06/2018  PT End of Session - 10/06/18 0929    Visit Number  2    Date for PT Re-Evaluation  11/10/18    PT Start Time  0843    PT Stop Time  0943    PT Time Calculation (min)  60 min    Activity Tolerance  Patient tolerated treatment well    Behavior During Therapy  [facility] for tasks assessed/performed       Past Medical History:  Diagnosis Date  . Allergic rhinitis   . Asthma   . Bilateral knee pain 2016   x-ray showed medial compartment arthritis, worse on right than left  . Erectile dysfunction 07/30/2014  . GERD (gastroesophageal reflux disease)   . Hearing difficulty    has aids  . History of kidney stones   . Hyperlipidemia    intolerant to zocor  . Pneumonia   . Sinusitis, chronic     Past Surgical History:  Procedure Laterality Date  . CATARACT EXTRACTION Right 2016  . MASTOIDECTOMY     right   . right hand surg     for infection in '07  . SINUS ENDO WITH FUSION    . TOE SURGERY     Left Great toe  . TOTAL KNEE ARTHROPLASTY Bilateral 08/30/2018  . TOTAL KNEE ARTHROPLASTY Bilateral 08/30/2018   Procedure: BILATERAL TOTAL KNEE ARTHROPLASTY;  Surgeon: Meredith Pel, MD;  Location: [facility];  Service: Orthopedics;  Laterality: Bilateral;    There were no vitals filed for this visit.  Subjective Assessment - 10/06/18 0842    Subjective  Pt reports that he is doing fine, does not really have pain, on difficulty bending L knee    Currently in Pain?  No/denies                       [facility] Adult PT Treatment/Exercise - 10/06/18 0001      Knee/Hip Exercises: Aerobic   Recumbent Bike  4 min full revolutions     Nustep  L3 x 4  min       Knee/Hip Exercises: Machines for Strengthening   Cybex Knee Extension  10lb 2x10     Cybex Knee Flexion  25lb 2x10     Cybex Leg Press  30lb 3 x10       Knee/Hip Exercises: Standing   Heel Raises  Both;2 sets;15 reps;2 seconds    Walking with Sports Cord  40lb resisted gait 4 way x 3 each      Knee/Hip Exercises: Seated   Long Arc Quad  Both;1 set;15 reps;Weights    Long Arc Quad Weight  3 lbs.    Hamstring Curl  Both;1 set;15 reps;Weights    Hamstring Limitations  blue tband       Modalities   Modalities  Cryotherapy;Vasopneumatic      Cryotherapy   Number Minutes Cryotherapy  10 Minutes    Cryotherapy Location  Knee    Type of Cryotherapy  Ice pack      Vasopneumatic   Number Minutes Vasopneumatic   15 minutes    Vasopnuematic Location   Knee    Vasopneumatic Pressure  Medium    Vasopneumatic  Temperature   32      Manual Therapy   Manual Therapy  Passive ROM;Joint mobilization    Manual therapy comments  pain at end range of R knee flexion     Joint Mobilization  Tibiofemoral to increase R knee flexion    Passive ROM  Bilat knees flex and ext more so on RLE               PT Short Term Goals - 10/06/18 0930      PT SHORT TERM GOAL #1   Title  Ind with HEP    Status  Achieved        PT Long Term Goals - 09/29/18 1201      PT LONG TERM GOAL #1   Title  Improved bil knee flexion to 115 deg or better to normalize ADLs    Time  6    Period  Weeks    Status  New    Target Date  11/10/18      PT LONG TERM GOAL #2   Title  Patient able to perfrom sit to stands without difficulty    Time  6    Period  Weeks    Status  New      PT LONG TERM GOAL #3   Title  Patient able to squat and return to stand without difficulty    Time  6    Period  Weeks    Status  New      PT LONG TERM GOAL #4   Title  patient able to climb stairs with a reciprocal gait pattern.    Time  6    Period  Weeks    Status  New            Plan - 10/06/18 0930     Clinical Impression Statement  Overall pt bilat knee ROM is good, some tightness noted in RLE at end range of flexion compared to the right. Pt did really well tolerating the resistance with today's activities. Cues at times to increase R hip and knee flexion with gait. Cues not to circumduct RLE with step ups, he has the ability to flex R hip and knee to step up but circumduct out of habit.     Rehab Potential  Excellent    PT Frequency  1x / week    PT Treatment/Interventions  ADLs/Self Care Home Management;Cryotherapy;Glass blower/designer;Therapeutic activities;Therapeutic exercise;Balance training;Patient/family education;Manual techniques;Passive range of motion;Vasopneumatic Device    PT Next Visit Plan  focus on knee flexion, sit to stands from low surface, return to stand from squat        Patient will benefit from skilled therapeutic intervention in order to improve the following deficits and impairments:  Pain, Decreased strength, Decreased range of motion, Increased edema  Visit Diagnosis: Stiffness of left knee, not elsewhere classified  Stiffness of right knee, not elsewhere classified     Problem List Patient Active Problem List   Diagnosis Date Noted  . Unilateral primary osteoarthritis, left knee   . Unilateral primary osteoarthritis, right knee   . Arthritis of knee 08/30/2018  . CAP (community acquired pneumonia) 08/19/2016  . GERD with esophagitis 08/19/2016  . PCP NOTES >>>>>>>>>>>>>>>>>> 02/18/2016  . DJD (knees, celebrex, ortho Dr Sharol Given) 01/08/2015  . Erectile dysfunction 07/30/2014  . Annual physical exam 12/29/2013  . Non-functioning tympanostomy tube 09/10/2011  . Dysphagia 09/10/2011  . Chronic sinusitis 02/05/2010  . HEADACHE 02/05/2010  . Anxiety -insomnia 01/04/2009  .  Hyperlipidemia 01/04/2009  . ALLERGIC RHINITIS 10/27/2007  . Asthma 10/27/2007    Scot Jun 10/06/2018, 9:34 AM  [place] [address] [place], [place], [address] Phone: [phone]   Fax:  [phone]  Name: Brett Berry MRN: [id number] Date of Birth: 1951-05-06

## 2018-10-08 ENCOUNTER — Other Ambulatory Visit: Payer: Self-pay | Admitting: Internal Medicine

## 2018-10-11 ENCOUNTER — Ambulatory Visit: Payer: Medicare Other | Admitting: Physical Therapy

## 2018-10-11 ENCOUNTER — Encounter: Payer: Self-pay | Admitting: Physical Therapy

## 2018-10-11 DIAGNOSIS — M25661 Stiffness of right knee, not elsewhere classified: Secondary | ICD-10-CM | POA: Diagnosis not present

## 2018-10-11 DIAGNOSIS — M25662 Stiffness of left knee, not elsewhere classified: Secondary | ICD-10-CM

## 2018-10-11 NOTE — Therapy (Signed)
[place], [place], [address] Phone: [phone]   Fax:  [phone]  Physical Therapy Treatment  Patient Details  Name: Brett Berry MRN: [id number] Date of Birth: 1950/11/17 Referring Provider (PT): Marcene Duos   Encounter Date: 10/11/2018  PT End of Session - 10/11/18 1229    Visit Number  3    Date for PT Re-Evaluation  11/10/18    PT Start Time  1140    PT Stop Time  1243    PT Time Calculation (min)  63 min       Past Medical History:  Diagnosis Date  . Allergic rhinitis   . Asthma   . Bilateral knee pain 2016   x-ray showed medial compartment arthritis, worse on right than left  . Erectile dysfunction 07/30/2014  . GERD (gastroesophageal reflux disease)   . Hearing difficulty    has aids  . History of kidney stones   . Hyperlipidemia    intolerant to zocor  . Pneumonia   . Sinusitis, chronic     Past Surgical History:  Procedure Laterality Date  . CATARACT EXTRACTION Right 2016  . MASTOIDECTOMY     right   . right hand surg     for infection in '07  . SINUS ENDO WITH FUSION    . TOE SURGERY     Left Great toe  . TOTAL KNEE ARTHROPLASTY Bilateral 08/30/2018  . TOTAL KNEE ARTHROPLASTY Bilateral 08/30/2018   Procedure: BILATERAL TOTAL KNEE ARTHROPLASTY;  Surgeon: Meredith Pel, MD;  Location: [facility];  Service: Orthopedics;  Laterality: Bilateral;    There were no vitals filed for this visit.  Subjective Assessment - 10/11/18 1143    Subjective  Pt reports that he was sore the next day after therapy. Doing good overall reports walking 2.5 miles Sunday    Currently in Pain?  No/denies         [facility] PT Assessment - 10/11/18 0001      AROM   Right Knee Flexion  105    Left Knee Flexion  109      PROM   Right Knee Flexion  115    Left Knee Flexion  120                   [facility] Adult PT Treatment/Exercise - 10/11/18 0001      Ambulation/Gait    Stairs  Yes    Stairs Assistance  7: Independent;6: Modified independent (Device/Increase time)    Stair Management Technique  One rail Right;Alternating pattern    Number of Stairs  48    Height of Stairs  6      High Level Balance   High Level Balance Comments  Standing on BOSU 2 x 30 sec, Bosu standing with ball toss.      Knee/Hip Exercises: Aerobic   Elliptical  I10 R 5 2 min each way     Recumbent Bike  4 min full revolutions L1       Knee/Hip Exercises: Machines for Strengthening   Cybex Knee Extension  10lb x15, SL 5lb x10 each     Cybex Knee Flexion  35lb x10; 20lb SL 2x10 each      Cybex Leg Press  40lb 2 x10, SL 20lb x10        Knee/Hip Exercises: Standing   Heel Raises  Both;2 sets;15 reps;2 seconds      Knee/Hip Exercises: Seated  Sit to Sand  2 sets;10 reps;without UE support   holding 5lb      Modalities   Modalities  Cryotherapy;Vasopneumatic      Cryotherapy   Number Minutes Cryotherapy  15 Minutes    Cryotherapy Location  Knee    Type of Cryotherapy  Ice pack      Vasopneumatic   Number Minutes Vasopneumatic   15 minutes    Vasopnuematic Location   Knee    Vasopneumatic Pressure  Medium    Vasopneumatic Temperature   32      Manual Therapy   Manual Therapy  Passive ROM;Joint mobilization    Manual therapy comments  pain at end range of R knee flexion     Joint Mobilization  Tibiofemoral to increase R knee flexion    Passive ROM  Bilat knees flex and ext more so on RLE               PT Short Term Goals - 10/06/18 0930      PT SHORT TERM GOAL #1   Title  Ind with HEP    Status  Achieved        PT Long Term Goals - 10/11/18 1230      PT LONG TERM GOAL #1   Title  Improved bil knee flexion to 115 deg or better to normalize ADLs    Status  Partially Met      PT LONG TERM GOAL #2   Title  Patient able to perfrom sit to stands without difficulty    Status  Partially Met      PT LONG TERM GOAL #3   Title  Patient able to squat  and return to stand without difficulty    Status  Partially Met      PT LONG TERM GOAL #4   Title  patient able to climb stairs with a reciprocal gait pattern.    Status  Achieved            Plan - 10/11/18 1241    Clinical Impression Statement  Pt is progressing towards all goals, some R knee stiffness remains compared to L. He has improved both his passive and active knee ROM. Pain at end range of flexion with RLE. He did well with all strengthening exercises. Some instability when standing on BOSU.      Rehab Potential  Excellent    PT Frequency  1x / week    PT Duration  6 weeks    PT Treatment/Interventions  ADLs/Self Care Home Management;Cryotherapy;Glass blower/designer;Therapeutic activities;Therapeutic exercise;Balance training;Patient/family education;Manual techniques;Passive range of motion;Vasopneumatic Device    PT Next Visit Plan  focus on knee flexion, sit to stands from low surface, return to stand from squat, balance        Patient will benefit from skilled therapeutic intervention in order to improve the following deficits and impairments:  Pain, Decreased strength, Decreased range of motion, Increased edema  Visit Diagnosis: Stiffness of left knee, not elsewhere classified  Stiffness of right knee, not elsewhere classified     Problem List Patient Active Problem List   Diagnosis Date Noted  . Unilateral primary osteoarthritis, left knee   . Unilateral primary osteoarthritis, right knee   . Arthritis of knee 08/30/2018  . CAP (community acquired pneumonia) 08/19/2016  . GERD with esophagitis 08/19/2016  . PCP NOTES >>>>>>>>>>>>>>>>>> 02/18/2016  . DJD (knees, celebrex, ortho Dr Sharol Given) 01/08/2015  . Erectile dysfunction 07/30/2014  . Annual physical exam 12/29/2013  .  Non-functioning tympanostomy tube 09/10/2011  . Dysphagia 09/10/2011  . Chronic sinusitis 02/05/2010  . HEADACHE 02/05/2010  . Anxiety -insomnia 01/04/2009  .  Hyperlipidemia 01/04/2009  . ALLERGIC RHINITIS 10/27/2007  . Asthma 10/27/2007    Scot Jun, PTA 10/11/2018, 12:49 PM  [place], [place], [address] Phone: [phone]   Fax:  [phone]  Name: Brett Berry MRN: [id number] Date of Birth: 08-29-1950

## 2018-10-12 ENCOUNTER — Other Ambulatory Visit (HOSPITAL_COMMUNITY): Payer: Self-pay | Admitting: Orthopedic Surgery

## 2018-10-12 NOTE — Telephone Encounter (Signed)
Ok to rf? [name] patient.

## 2018-10-18 ENCOUNTER — Ambulatory Visit: Payer: Medicare Other | Admitting: Physical Therapy

## 2018-10-26 ENCOUNTER — Ambulatory Visit (INDEPENDENT_AMBULATORY_CARE_PROVIDER_SITE_OTHER): Payer: Medicare Other | Admitting: Orthopedic Surgery

## 2018-10-27 ENCOUNTER — Telehealth (INDEPENDENT_AMBULATORY_CARE_PROVIDER_SITE_OTHER): Payer: Self-pay | Admitting: Radiology

## 2018-10-27 NOTE — Telephone Encounter (Signed)
Called and asked patient COVID-19 screening questions. Patient answered NO to all questions.

## 2018-10-28 ENCOUNTER — Encounter (INDEPENDENT_AMBULATORY_CARE_PROVIDER_SITE_OTHER): Payer: Self-pay | Admitting: Orthopedic Surgery

## 2018-10-28 ENCOUNTER — Other Ambulatory Visit: Payer: Self-pay

## 2018-10-28 ENCOUNTER — Ambulatory Visit (INDEPENDENT_AMBULATORY_CARE_PROVIDER_SITE_OTHER): Payer: Medicare Other | Admitting: Orthopedic Surgery

## 2018-10-28 DIAGNOSIS — M1712 Unilateral primary osteoarthritis, left knee: Secondary | ICD-10-CM

## 2018-10-28 DIAGNOSIS — M1711 Unilateral primary osteoarthritis, right knee: Secondary | ICD-10-CM

## 2018-10-28 NOTE — Progress Notes (Signed)
Post-Op Visit Note   Patient: Brett Berry           Date of Birth: [birth year]           MRN: [id number] Visit Date: 10/28/2018 PCP: Colon Branch, MD   Assessment & Plan:  Chief Complaint: No chief complaint on file.  Visit Diagnoses:  1. Unilateral primary osteoarthritis, left knee   2. Unilateral primary osteoarthritis, right knee     Plan: [name] is now about 2 months in a week out bilateral total knee replacement.  He is doing well.  On the right he has range of motion to 115 on the left 120.  Trace effusion both knees.  Gait looks normal.  Alignment looks normal.  Plan at this time is to let him go back to work.  Need for antibiotic prophylaxis discussed.  Follow-up with me as needed.  He does look very good.  Follow-Up Instructions: Return if symptoms worsen or fail to improve.   Orders:  No orders of the defined types were placed in this encounter.  No orders of the defined types were placed in this encounter.   Imaging: No results found.  PMFS History: Patient Active Problem List   Diagnosis Date Noted  . Unilateral primary osteoarthritis, left knee   . Unilateral primary osteoarthritis, right knee   . Arthritis of knee 08/30/2018  . CAP (community acquired pneumonia) 08/19/2016  . GERD with esophagitis 08/19/2016  . PCP NOTES >>>>>>>>>>>>>>>>>> 02/18/2016  . DJD (knees, celebrex, ortho Dr Sharol Given) 01/08/2015  . Erectile dysfunction 07/30/2014  . Annual physical exam 12/29/2013  . Non-functioning tympanostomy tube 09/10/2011  . Dysphagia 09/10/2011  . Chronic sinusitis 02/05/2010  . HEADACHE 02/05/2010  . Anxiety -insomnia 01/04/2009  . Hyperlipidemia 01/04/2009  . ALLERGIC RHINITIS 10/27/2007  . Asthma 10/27/2007   Past Medical History:  Diagnosis Date  . Allergic rhinitis   . Asthma   . Bilateral knee pain 2016   x-ray showed medial compartment arthritis, worse on right than left  . Erectile dysfunction 07/30/2014  . GERD (gastroesophageal reflux  disease)   . Hearing difficulty    has aids  . History of kidney stones   . Hyperlipidemia    intolerant to zocor  . Pneumonia   . Sinusitis, chronic     Family History  Problem Relation Age of Onset  . COPD Mother   . Breast cancer Mother   . Alzheimer's disease Father   . Lung cancer Father        smoker  . Prostate cancer Father        died age 64  . Heart disease Father        age 53  . Colon cancer Neg Hx     Past Surgical History:  Procedure Laterality Date  . CATARACT EXTRACTION Right 2016  . MASTOIDECTOMY     right   . right hand surg     for infection in '07  . SINUS ENDO WITH FUSION    . TOE SURGERY     Left Great toe  . TOTAL KNEE ARTHROPLASTY Bilateral 08/30/2018  . TOTAL KNEE ARTHROPLASTY Bilateral 08/30/2018   Procedure: BILATERAL TOTAL KNEE ARTHROPLASTY;  Surgeon: Meredith Pel, MD;  Location: [facility];  Service: Orthopedics;  Laterality: Bilateral;   Social History   Occupational History  . Occupation: Environmental education officer: [employer]  Tobacco Use  . Smoking status: Former Smoker    Last  attempt to quit: 09/08/1973    Years since quitting: 45.1  . Smokeless tobacco: Never Used  Substance and Sexual Activity  . Alcohol use: Yes    Comment: 2-3 times week  . Drug use: No  . Sexual activity: Yes    Partners: Female

## 2018-10-31 ENCOUNTER — Ambulatory Visit (INDEPENDENT_AMBULATORY_CARE_PROVIDER_SITE_OTHER): Payer: Medicare Other | Admitting: Orthopedic Surgery

## 2019-01-17 ENCOUNTER — Other Ambulatory Visit: Payer: Self-pay

## 2019-01-17 ENCOUNTER — Other Ambulatory Visit: Payer: Medicare Other

## 2019-01-17 DIAGNOSIS — R6889 Other general symptoms and signs: Secondary | ICD-10-CM | POA: Diagnosis not present

## 2019-01-17 DIAGNOSIS — Z20822 Contact with and (suspected) exposure to covid-19: Secondary | ICD-10-CM

## 2019-01-20 LAB — NOVEL CORONAVIRUS, NAA: SARS-CoV-2, NAA: NOT DETECTED

## 2019-02-28 ENCOUNTER — Other Ambulatory Visit (INDEPENDENT_AMBULATORY_CARE_PROVIDER_SITE_OTHER): Payer: Self-pay | Admitting: Orthopedic Surgery

## 2019-03-01 ENCOUNTER — Other Ambulatory Visit: Payer: Medicare Other

## 2019-03-01 ENCOUNTER — Other Ambulatory Visit: Payer: Self-pay

## 2019-03-01 DIAGNOSIS — Z20822 Contact with and (suspected) exposure to covid-19: Secondary | ICD-10-CM

## 2019-03-01 DIAGNOSIS — R6889 Other general symptoms and signs: Secondary | ICD-10-CM | POA: Diagnosis not present

## 2019-03-01 NOTE — Telephone Encounter (Signed)
Please advise. Thanks.  

## 2019-03-01 NOTE — Telephone Encounter (Signed)
Ok to rf? 

## 2019-03-02 LAB — NOVEL CORONAVIRUS, NAA: SARS-CoV-2, NAA: NOT DETECTED

## 2019-03-07 ENCOUNTER — Other Ambulatory Visit: Payer: Self-pay

## 2019-03-07 DIAGNOSIS — Z20822 Contact with and (suspected) exposure to covid-19: Secondary | ICD-10-CM

## 2019-03-07 DIAGNOSIS — R6889 Other general symptoms and signs: Secondary | ICD-10-CM | POA: Diagnosis not present

## 2019-03-08 ENCOUNTER — Other Ambulatory Visit: Payer: Self-pay | Admitting: Internal Medicine

## 2019-03-08 ENCOUNTER — Telehealth: Payer: Self-pay | Admitting: Internal Medicine

## 2019-03-08 ENCOUNTER — Encounter: Payer: Medicare Other | Admitting: Internal Medicine

## 2019-03-08 DIAGNOSIS — J310 Chronic rhinitis: Secondary | ICD-10-CM

## 2019-03-08 LAB — NOVEL CORONAVIRUS, NAA: SARS-CoV-2, NAA: DETECTED — AB

## 2019-03-08 NOTE — Telephone Encounter (Signed)
Needs appt to discuss

## 2019-03-08 NOTE — Telephone Encounter (Signed)
Copied from [place] [id number]. Topic: General - Other >> Mar 08, 2019  4:43 PM Keene Breath wrote: Reason for CRM: Patient called to inform the doctor that his COVID test came back positive and it was suggested that he contact his PCP to get medication because he is asthmatic.  Please advise and call patient back to let him know what he can take.  CB# [phone]

## 2019-03-09 NOTE — Telephone Encounter (Signed)
DONE

## 2019-03-10 ENCOUNTER — Other Ambulatory Visit: Payer: Self-pay

## 2019-03-10 ENCOUNTER — Ambulatory Visit (INDEPENDENT_AMBULATORY_CARE_PROVIDER_SITE_OTHER): Payer: Medicare Other | Admitting: Internal Medicine

## 2019-03-10 DIAGNOSIS — U071 COVID-19: Secondary | ICD-10-CM | POA: Diagnosis not present

## 2019-03-10 MED ORDER — AZITHROMYCIN 250 MG PO TABS: ORAL_TABLET | ORAL | 0 refills | Status: DC

## 2019-03-10 NOTE — Progress Notes (Signed)
Subjective:    Patient ID: Brett Berry, male    DOB: 10-16-1950, 68 y.o.   MRN: [id number]  DOS:  03/10/2019 Type of visit - description: Virtual Visit via Video Note  I connected with@   by a video enabled telemedicine application and verified that I am speaking with the correct person using two identifiers.   THIS ENCOUNTER IS A VIRTUAL VISIT DUE TO COVID-19 - PATIENT WAS NOT SEEN IN THE OFFICE. PATIENT HAS CONSENTED TO VIRTUAL VISIT / TELEMEDICINE VISIT   Location of patient: home  Location of provider: office  I discussed the limitations of evaluation and management by telemedicine and the availability of in person appointments. The patient expressed understanding and agreed to proceed.  History of Present Illness: Acute Developed symptoms 03/06/2019: Fever up to 102.4, headaches, generalized aches, fatigue. Went to be checked for COVID-19 on 03/07/2019, test become positive. Since then symptoms are about the same, he did develop diarrhea today, watery. He has asthma, uses Symbicort BID and rarely reaches for albuterol.     Review of Systems  Denies difficulty breathing, occasional anterior chest pain with deep breathing. No nausea, vomiting, no blood in the stools. Cough and wheezing actually have very mild but he has noted colored sputum production with cough. No hemoptysis.  Past Medical History:  Diagnosis Date  . Allergic rhinitis   . Asthma   . Bilateral knee pain 2016   x-ray showed medial compartment arthritis, worse on right than left  . Erectile dysfunction 07/30/2014  . GERD (gastroesophageal reflux disease)   . Hearing difficulty    has aids  . History of kidney stones   . Hyperlipidemia    intolerant to zocor  . Pneumonia   . Sinusitis, chronic     Past Surgical History:  Procedure Laterality Date  . CATARACT EXTRACTION Right 2016  . MASTOIDECTOMY     right   . right hand surg     for infection in '07  . SINUS ENDO WITH FUSION    . TOE SURGERY      Left Great toe  . TOTAL KNEE ARTHROPLASTY Bilateral 08/30/2018  . TOTAL KNEE ARTHROPLASTY Bilateral 08/30/2018   Procedure: BILATERAL TOTAL KNEE ARTHROPLASTY;  Surgeon: Meredith Pel, MD;  Location: [facility];  Service: Orthopedics;  Laterality: Bilateral;    Social History   Socioeconomic History  . Marital status: Single    Spouse name: Not on file  . Number of children: 1  . Years of education: Not on file  . Highest education level: Not on file  Occupational History  . Occupation: Environmental education officer: [employer]  . Financial resource strain: Not on file  . Food insecurity    Worry: Not on file    Inability: Not on file  . Transportation needs    Medical: Not on file    Non-medical: Not on file  Tobacco Use  . Smoking status: Former Smoker    Quit date: 09/08/1973    Years since quitting: 45.5  . Smokeless tobacco: Never Used  Substance and Sexual Activity  . Alcohol use: Yes    Comment: 2-3 times week  . Drug use: No  . Sexual activity: Yes    Partners: Female  Lifestyle  . Physical activity    Days per week: Not on file    Minutes per session: Not on file  . Stress: Not on file  Relationships  . Social  connections    Talks on phone: Not on file    Gets together: Not on file    Attends religious service: Not on file    Active member of club or organization: Not on file    Attends meetings of clubs or organizations: Not on file    Relationship status: Not on file  . Intimate partner violence    Fear of current or ex partner: Not on file    Emotionally abused: Not on file    Physically abused: Not on file    Forced sexual activity: Not on file  Other Topics Concern  . Not on file  Social History Narrative   HSG. Married '76- 6 years divorced ; '85. 1 son- '86.    Lost wife 12-2014      Allergies as of 03/10/2019   No Known Allergies     Medication List       Accurate as of March 10, 2019 11:59 PM. If you  have any questions, ask your nurse or doctor.        STOP taking these medications   methocarbamol 500 MG tablet Commonly known as: ROBAXIN Stopped by: Kathlene November, MD   oxyCODONE 10 mg 12 hr tablet Commonly known as: OXYCONTIN Stopped by: Kathlene November, MD   oxycodone 5 MG capsule Commonly known as: OXY-IR Stopped by: Kathlene November, MD   oxyCODONE 5 MG immediate release tablet Commonly known as: Roxicodone Stopped by: Kathlene November, MD     TAKE these medications   acetaminophen 325 MG tablet Commonly known as: TYLENOL Take 1-2 tablets (325-650 mg total) by mouth every 6 (six) hours as needed for mild pain (pain score 1-3 or temp > 100.5).   albuterol (2.5 MG/3ML) 0.083% nebulizer solution Commonly known as: PROVENTIL Take 3 mLs (2.5 mg total) by nebulization every 4 (four) hours as needed for wheezing or shortness of breath.   azithromycin 250 MG tablet Commonly known as: Zithromax Z-Pak 2 tabs a day the first day, then 1 tab a day x 4 days Started by: Kathlene November, MD   budesonide-formoterol 160-4.5 MCG/ACT inhaler Commonly known as: Symbicort Inhale 2 puffs into the lungs 2 (two) times daily.   celecoxib 100 MG capsule Commonly known as: CELEBREX Take 1 capsule (100 mg total) by mouth 2 (two) times daily as needed for moderate pain.   CO Q 10 PO Take 1 capsule by mouth daily.   [facility] Aspirin Adult Low Dose 81 MG chewable tablet Generic drug: aspirin CHEW 1 TABLET BY MOUTH TWICE A DAY   docusate sodium 100 MG capsule Commonly known as: COLACE Take 1 capsule (100 mg total) by mouth 2 (two) times daily.   Durezol 0.05 % Emul Generic drug: Difluprednate Place 1 drop into the right eye 2 (two) times daily.   esomeprazole 40 MG capsule Commonly known as: NEXIUM Take 1 capsule (40 mg total) by mouth 2 (two) times daily before a meal.   Fish Oil 1000 MG Caps Take 1,000 mg by mouth 2 (two) times daily.   fluticasone 50 MCG/ACT nasal spray Commonly known as: FLONASE Place 2  sprays into the nose daily.   levalbuterol 45 MCG/ACT inhaler Commonly known as: Xopenex HFA Inhale 1-2 puffs into the lungs every 4 (four) hours as needed for wheezing or shortness of breath.   magnesium oxide 400 MG tablet Commonly known as: MAG-OX Take 400 mg by mouth daily.   multivitamin with minerals Tabs tablet Take 1 tablet by mouth daily.  Objective:   Physical Exam There were no vitals taken for this visit. This is a virtual video visit, patient is alert oriented x3, he looks tired but is in no acute or emotional distress. Reports her O2 sat today is 96%. (has a pulse oximeter)     Assessment       Assessment Hyperlipidemia ---intolerant to simvastatin Asthma GERD E.D. Chronic sinusitis, Rhinitis (ENT Dr Lucia Gaskins) DJD- knees R>>L, wrists  , Dr Sharol Given [facility] has aids  PLAN: [id number]: The patient developed symptoms 4 days ago, subsequently a test came back positive. He does not look toxic but has a history of asthma. He coughs colored sputum. Plan: Continue Tylenol, drink plenty fluids, continue Symbicort twice a day and use albuterol as needed. Start a Z-Pak for possible superimposed infection. Check temperature and O2 sats twice a day.  O2 sat should be above 93%. If he is gradually getting worse (high fever, bad headache, rash, chest pain, difficulty breathing, inability to stay hydrated), needs to call or go to the ER If sudden onset of deterioration: ER He will remain contagious for 2 weeks after the onset of the illness and as long as he is symptomatic. Strict isolation recommended, he lives with his son Verbalized understanding  Today, I spent more than 25   min with the patient: >50% of the time counseling regards COVID-19, what to expect, indication for the ER, see above.

## 2019-03-12 ENCOUNTER — Encounter: Payer: Self-pay | Admitting: Internal Medicine

## 2019-03-12 NOTE — Assessment & Plan Note (Signed)
COVID-19: The patient developed symptoms 4 days ago, subsequently a test came back positive. He does not look toxic but has a history of asthma. He coughs colored sputum. Plan: Continue Tylenol, drink plenty fluids, continue Symbicort twice a day and use albuterol as needed. Start a Z-Pak for possible superimposed infection. Check temperature and O2 sats twice a day.  O2 sat should be above 93%. If he is gradually getting worse (high fever, bad headache, rash, chest pain, difficulty breathing, inability to stay hydrated), needs to call or go to the ER If sudden onset of deterioration: ER He will remain contagious for 2 weeks after the onset of the illness and as long as he is symptomatic. Strict isolation recommended, he lives with his son Verbalized understanding

## 2019-03-13 ENCOUNTER — Telehealth: Payer: Self-pay | Admitting: Internal Medicine

## 2019-03-13 NOTE — Telephone Encounter (Signed)
Mychart message sent to Pt.

## 2019-03-13 NOTE — Telephone Encounter (Signed)
Recently diagnosed with COVID-19, please check on patient.  Better?  Getting worse?  Chest pain, fever, worse difficulty breathing?.  Let me know

## 2019-03-14 NOTE — Telephone Encounter (Signed)
Thank you :)

## 2019-03-14 NOTE — Telephone Encounter (Signed)
Spoke w/ Pt- he states he is feeling better- still having some fevers and congestion but no difficulty breathing.

## 2019-03-19 ENCOUNTER — Other Ambulatory Visit (INDEPENDENT_AMBULATORY_CARE_PROVIDER_SITE_OTHER): Payer: Self-pay | Admitting: Orthopedic Surgery

## 2019-03-20 NOTE — Telephone Encounter (Signed)
Ok to dc after 3 weeks post pop

## 2019-03-20 NOTE — Telephone Encounter (Signed)
Ok to rf? 

## 2019-04-13 ENCOUNTER — Other Ambulatory Visit: Payer: Self-pay | Admitting: Internal Medicine

## 2019-04-18 ENCOUNTER — Other Ambulatory Visit: Payer: Self-pay

## 2019-04-19 ENCOUNTER — Ambulatory Visit (INDEPENDENT_AMBULATORY_CARE_PROVIDER_SITE_OTHER): Payer: Medicare Other | Admitting: Internal Medicine

## 2019-04-19 ENCOUNTER — Encounter: Payer: Self-pay | Admitting: Internal Medicine

## 2019-04-19 VITALS — BP 155/85 | HR 54 | Temp 97.6°F | Resp 16 | Ht 70.0 in | Wt 187.0 lb

## 2019-04-19 DIAGNOSIS — Z0000 Encounter for general adult medical examination without abnormal findings: Secondary | ICD-10-CM

## 2019-04-19 DIAGNOSIS — E785 Hyperlipidemia, unspecified: Secondary | ICD-10-CM

## 2019-04-19 DIAGNOSIS — Z1211 Encounter for screening for malignant neoplasm of colon: Secondary | ICD-10-CM

## 2019-04-19 DIAGNOSIS — Z23 Encounter for immunization: Secondary | ICD-10-CM | POA: Diagnosis not present

## 2019-04-19 NOTE — Progress Notes (Signed)
Pre visit review using our clinic review tool, if applicable. No additional management support is needed unless otherwise documented below in the visit note. 

## 2019-04-19 NOTE — Progress Notes (Signed)
Subjective:    Patient ID: Brett Berry, male    DOB: October 19, 1950, 68 y.o.   MRN: [id number]  DOS:  04/19/2019 Type of visit - description: CPX In general feeling great, had COVID-19 few weeks ago, fully recuperated. Ambulatory BPs are very good Asthma is well controlled except in the last few days, mild cough.  No other symptoms.    Review of Systems  Other than above, a 14 point review of systems is negative   Past Medical History:  Diagnosis Date  . Allergic rhinitis   . Asthma   . Bilateral knee pain 2016   x-ray showed medial compartment arthritis, worse on right than left  . Erectile dysfunction 07/30/2014  . GERD (gastroesophageal reflux disease)   . Hearing difficulty    has aids  . History of kidney stones   . Hyperlipidemia    intolerant to zocor  . Pneumonia   . Sinusitis, chronic     Past Surgical History:  Procedure Laterality Date  . CARPAL TUNNEL RELEASE Right 06/2018  . CATARACT EXTRACTION Right 2016  . MASTOIDECTOMY     right   . right hand surg     for infection in '07  . SINUS ENDO WITH FUSION    . TOE SURGERY     Left Great toe  . TOTAL KNEE ARTHROPLASTY Bilateral 08/30/2018  . TOTAL KNEE ARTHROPLASTY Bilateral 08/30/2018   Procedure: BILATERAL TOTAL KNEE ARTHROPLASTY;  Surgeon: Meredith Pel, MD;  Location: [facility];  Service: Orthopedics;  Laterality: Bilateral;    Social History   Socioeconomic History  . Marital status: Single    Spouse name: Not on file  . Number of children: 1  . Years of education: Not on file  . Highest education level: Not on file  Occupational History  . Occupation: Environmental education officer: [employer]  . Financial resource strain: Not on file  . Food insecurity    Worry: Not on file    Inability: Not on file  . Transportation needs    Medical: Not on file    Non-medical: Not on file  Tobacco Use  . Smoking status: Former Smoker    Quit date: 09/08/1973    Years  since quitting: 45.6  . Smokeless tobacco: Never Used  Substance and Sexual Activity  . Alcohol use: Yes    Comment: 2-3 times week  . Drug use: No  . Sexual activity: Yes    Partners: Female  Lifestyle  . Physical activity    Days per week: Not on file    Minutes per session: Not on file  . Stress: Not on file  Relationships  . Social Herbalist on phone: Not on file    Gets together: Not on file    Attends religious service: Not on file    Active member of club or organization: Not on file    Attends meetings of clubs or organizations: Not on file    Relationship status: Not on file  . Intimate partner violence    Fear of current or ex partner: Not on file    Emotionally abused: Not on file    Physically abused: Not on file    Forced sexual activity: Not on file  Other Topics Concern  . Not on file  Social History Narrative   HSG. Married '76- 6 years divorced ; '85. 1 son- '86.    Lost wife  12-2014      Allergies as of 04/19/2019   No Known Allergies     Medication List       Accurate as of April 19, 2019 11:59 PM. If you have any questions, ask your nurse or doctor.        STOP taking these medications   acetaminophen 325 MG tablet Commonly known as: TYLENOL Stopped by: Kathlene November, MD   azithromycin 250 MG tablet Commonly known as: Zithromax Z-Pak Stopped by: Kathlene November, MD   [employer]S Aspirin Adult Low Dose 81 MG chewable tablet Generic drug: aspirin Stopped by: Kathlene November, MD   docusate sodium 100 MG capsule Commonly known as: COLACE Stopped by: Kathlene November, MD   Durezol 0.05 % Emul Generic drug: Difluprednate Stopped by: Kathlene November, MD     TAKE these medications   albuterol (2.5 MG/3ML) 0.083% nebulizer solution Commonly known as: PROVENTIL Take 3 mLs (2.5 mg total) by nebulization every 4 (four) hours as needed for wheezing or shortness of breath.   budesonide-formoterol 160-4.5 MCG/ACT inhaler Commonly known as: Symbicort Inhale 2 puffs  into the lungs 2 (two) times daily.   celecoxib 100 MG capsule Commonly known as: CELEBREX Take 1 capsule (100 mg total) by mouth 2 (two) times daily as needed for moderate pain.   CO Q 10 PO Take 1 capsule by mouth daily.   esomeprazole 40 MG capsule Commonly known as: NEXIUM Take 1 capsule (40 mg total) by mouth 2 (two) times daily before a meal.   Fish Oil 1000 MG Caps Take 1,000 mg by mouth 2 (two) times daily.   fluticasone 50 MCG/ACT nasal spray Commonly known as: FLONASE Place 2 sprays into the nose daily.   levalbuterol 45 MCG/ACT inhaler Commonly known as: Xopenex HFA Inhale 1-2 puffs into the lungs every 4 (four) hours as needed for wheezing or shortness of breath.   magnesium oxide 400 MG tablet Commonly known as: MAG-OX Take 400 mg by mouth daily.   multivitamin with minerals Tabs tablet Take 1 tablet by mouth daily.      Family History  Problem Relation Age of Onset  . COPD Mother   . Breast cancer Mother   . Alzheimer's disease Father   . Lung cancer Father        smoker  . Prostate cancer Father 33       died age 28  . Heart disease Father        age 46  . Colon cancer Neg Hx         Objective:   Physical Exam BP (!) 155/85 (BP Location: Left Arm)   Pulse (!) 54   Temp 97.6 F (36.4 C) (Temporal)   Resp 16   Ht 5\' 10"  (1.778 m)   Wt 187 lb (84.8 kg)   SpO2 100%   BMI 26.83 kg/m  General: Well developed, NAD, BMI noted Neck: No  thyromegaly  HEENT:  Normocephalic . Face symmetric, atraumatic Lungs:  CTA B Normal respiratory effort, no intercostal retractions, no accessory muscle use. Heart: RRR,  no murmur.  No pretibial edema bilaterally  Abdomen:  Not distended, soft, non-tender. No rebound or rigidity.   Skin: Exposed areas without rash. Not pale. Not jaundice Neurologic:  alert & oriented X3.  Speech normal, gait appropriate for age and unassisted Strength symmetric and appropriate for age.  Psych: Cognition and judgment  appear intact.  Cooperative with normal attention span and concentration.  Behavior appropriate. No anxious or depressed  appearing.     Assessment      Assessment Hyperlipidemia ---intolerant to simvastatin Asthma GERD E.D. Chronic sinusitis, Rhinitis (ENT Dr Lucia Gaskins) DJD- knees R>>L, wrists  , Dr Sharol Given [facility] has aids COVID-19 Dx 03/2019  PLAN: Hyperlipidemia: History of simvastatin intolerance, on diet control, CV RF 24.4%, he qualify for statins.  This was discussed with the patient, were checking a FLP today, he will consider statins. Elevated BP: BP today 184/79, recheck manually: 155/85, at home it is consistently in the 130s/70.  Recommend to continue monitoring. Asthma: Well-controlled, mild cough in the last few days, continue present care for now. DJD: On Celebrex daily, no apparent side effects. Aspirin: Not taking it consistently we agreed on stop ASA. RTC 6 months

## 2019-04-19 NOTE — Patient Instructions (Addendum)
Please schedule Medicare Wellness with Glenard Haring.   GO TO THE LAB : Get the blood work     GO TO THE FRONT DESK Schedule your next appointment  For a check up in 6 months   Continue checking your blood pressures: BP GOAL is between 110/65 and  135/85. If it is consistently higher or lower, let me know

## 2019-04-19 NOTE — Assessment & Plan Note (Signed)
-  Td 2013 - PNM shot 2013 - prevnar 2015 - Shingles shot 01-2015 - shingrex x 2 2019 - flu shot today -CCS- 09/25/11-adenomatous polyps and diverticulosis; due for a cscope, GI referral sent  -DRE- PSA 2019:  wnl  -Labs: CMP, FLP, CBC, TSH -Diet -exercise discussed, he is doing well

## 2019-04-20 LAB — COMPREHENSIVE METABOLIC PANEL
ALT: 17 U/L (ref 0–53)
AST: 21 U/L (ref 0–37)
Albumin: 4.2 g/dL (ref 3.5–5.2)
Alkaline Phosphatase: 61 U/L (ref 39–117)
BUN: 17 mg/dL (ref 6–23)
CO2: 29 mEq/L (ref 19–32)
Calcium: 9.6 mg/dL (ref 8.4–10.5)
Chloride: 104 mEq/L (ref 96–112)
Creatinine, Ser: 1 mg/dL (ref 0.40–1.50)
GFR: 74.66 mL/min (ref >60.00)
Glucose, Bld: 84 mg/dL (ref 70–99)
Potassium: 4.7 mEq/L (ref 3.5–5.1)
Sodium: 139 mEq/L (ref 135–145)
Total Bilirubin: 0.9 mg/dL (ref 0.2–1.2)
Total Protein: 7 g/dL (ref 6.0–8.3)

## 2019-04-20 LAB — CBC WITH DIFFERENTIAL/PLATELET
Basophils Absolute: 0.1 10*3/uL (ref 0.0–0.1)
Basophils Relative: 1.1 % (ref 0.0–3.0)
Eosinophils Absolute: 0.7 10*3/uL (ref 0.0–0.7)
Eosinophils Relative: 8.2 % — ABNORMAL HIGH (ref 0.0–5.0)
HCT: 37.6 % — ABNORMAL LOW (ref 39.0–52.0)
Hemoglobin: 12.6 g/dL — ABNORMAL LOW (ref 13.0–17.0)
Lymphocytes Relative: 33.2 % (ref 12.0–46.0)
Lymphs Abs: 2.8 10*3/uL (ref 0.7–4.0)
MCHC: 33.6 g/dL (ref 30.0–36.0)
MCV: 98.2 fl (ref 78.0–100.0)
Monocytes Absolute: 0.6 10*3/uL (ref 0.1–1.0)
Monocytes Relative: 7.2 % (ref 3.0–12.0)
Neutro Abs: 4.2 10*3/uL (ref 1.4–7.7)
Neutrophils Relative %: 50.3 % (ref 43.0–77.0)
Platelets: 213 10*3/uL (ref 150.0–400.0)
RBC: 3.83 Mil/uL — ABNORMAL LOW (ref 4.22–5.81)
RDW: 14.5 % (ref 11.5–15.5)
WBC: 8.4 10*3/uL (ref 4.0–10.5)

## 2019-04-20 LAB — LIPID PANEL
Cholesterol: 209 mg/dL — ABNORMAL HIGH (ref 0–200)
HDL: 52.7 mg/dL (ref >39.00)
LDL Cholesterol: 139 mg/dL — ABNORMAL HIGH (ref 0–99)
NonHDL: 156.02
Total CHOL/HDL Ratio: 4
Triglycerides: 86 mg/dL (ref 0.0–149.0)
VLDL: 17.2 mg/dL (ref 0.0–40.0)

## 2019-04-20 LAB — TSH: TSH: 2.87 u[IU]/mL (ref 0.35–4.50)

## 2019-04-21 NOTE — Assessment & Plan Note (Signed)
Hyperlipidemia: History of simvastatin intolerance, on diet control, CV RF 24.4%, he qualify for statins.  This was discussed with the patient, were checking a FLP today, he will consider statins. Elevated BP: BP today 184/79, recheck manually: 155/85, at home it is consistently in the 130s/70.  Recommend to continue monitoring. Asthma: Well-controlled, mild cough in the last few days, continue present care for now. DJD: On Celebrex daily, no apparent side effects. Aspirin: Not taking it consistently we agreed on stop ASA. RTC 6 months

## 2019-04-24 MED ORDER — ATORVASTATIN CALCIUM 10 MG PO TABS: 10.0000 mg | ORAL_TABLET | Freq: Every day | ORAL | 3 refills | Status: DC

## 2019-04-24 NOTE — Addendum Note (Signed)
Addended byDamita Dunnings D on: 04/24/2019 08:02 AM   Modules accepted: Orders

## 2019-05-09 ENCOUNTER — Other Ambulatory Visit: Payer: Self-pay | Admitting: Internal Medicine

## 2019-05-17 DIAGNOSIS — J329 Chronic sinusitis, unspecified: Secondary | ICD-10-CM | POA: Diagnosis not present

## 2019-05-17 DIAGNOSIS — H66003 Acute suppurative otitis media without spontaneous rupture of ear drum, bilateral: Secondary | ICD-10-CM | POA: Diagnosis not present

## 2019-05-24 ENCOUNTER — Other Ambulatory Visit: Payer: Self-pay | Admitting: Internal Medicine

## 2019-06-05 ENCOUNTER — Other Ambulatory Visit: Payer: Self-pay | Admitting: Internal Medicine

## 2019-06-05 DIAGNOSIS — J310 Chronic rhinitis: Secondary | ICD-10-CM

## 2019-08-21 ENCOUNTER — Other Ambulatory Visit: Payer: Self-pay | Admitting: Internal Medicine

## 2019-09-06 ENCOUNTER — Other Ambulatory Visit: Payer: Self-pay | Admitting: Internal Medicine

## 2019-09-11 ENCOUNTER — Other Ambulatory Visit: Payer: Self-pay | Admitting: Surgical

## 2019-09-11 ENCOUNTER — Telehealth: Payer: Self-pay

## 2019-09-11 MED ORDER — METHOCARBAMOL 500 MG PO TABS: 500.0000 mg | ORAL_TABLET | Freq: Three times a day (TID) | ORAL | 1 refills | Status: DC | PRN

## 2019-09-11 MED ORDER — ASPIRIN EC 81 MG PO TBEC: 81.0000 mg | DELAYED_RELEASE_TABLET | Freq: Every day | ORAL | 3 refills | Status: DC

## 2019-09-11 NOTE — Telephone Encounter (Signed)
Patient requesting refill on robaxin and aspirin. Ok to rf?

## 2019-09-12 IMAGING — CR DG CHEST 2V
2 series · 2 of 2 positions shown · non-contrast
Comparison: 09/15/2016

CLINICAL DATA: Chest pain 3 days.

EXAM:
CHEST - 2 VIEW

[w chest pa]
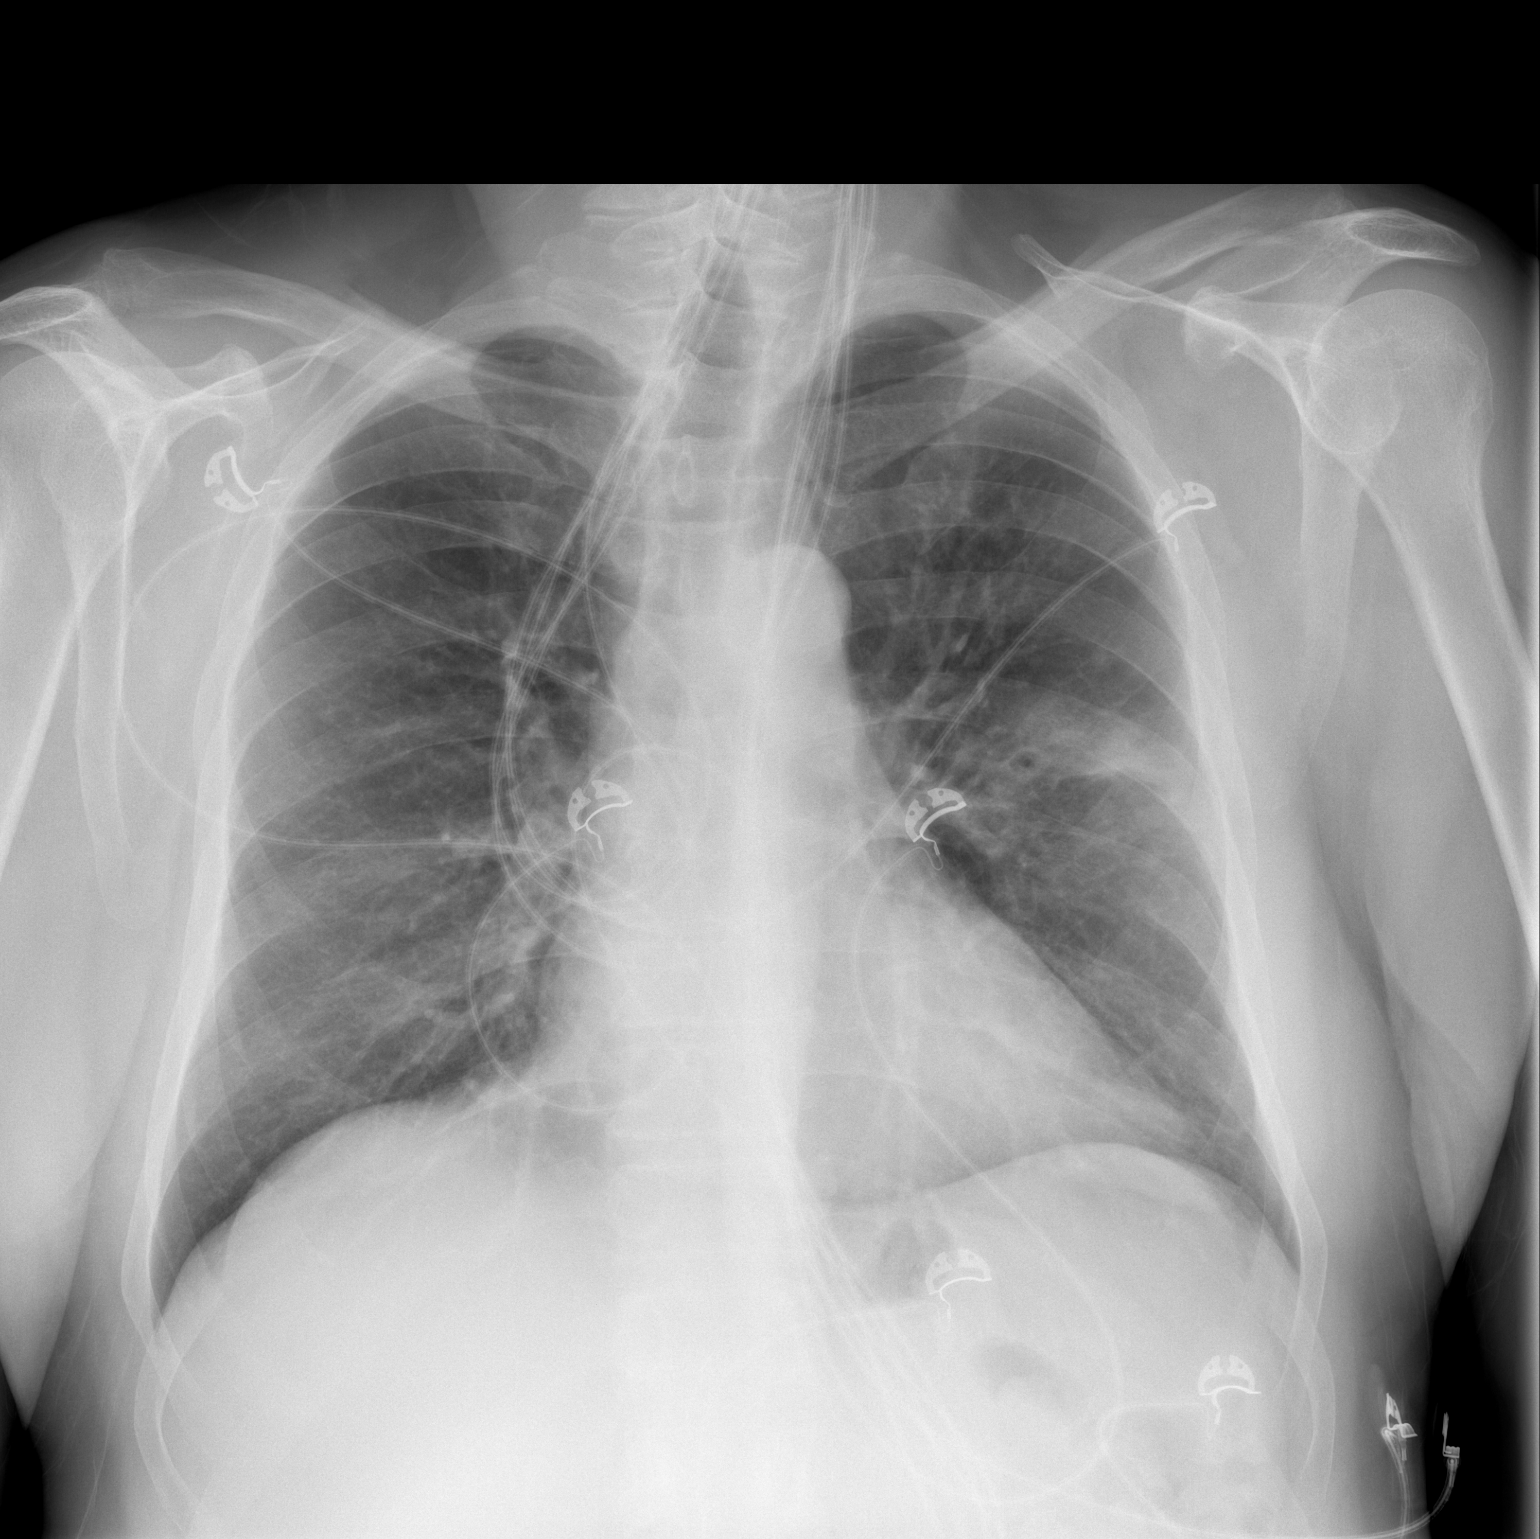

[w chest lat]
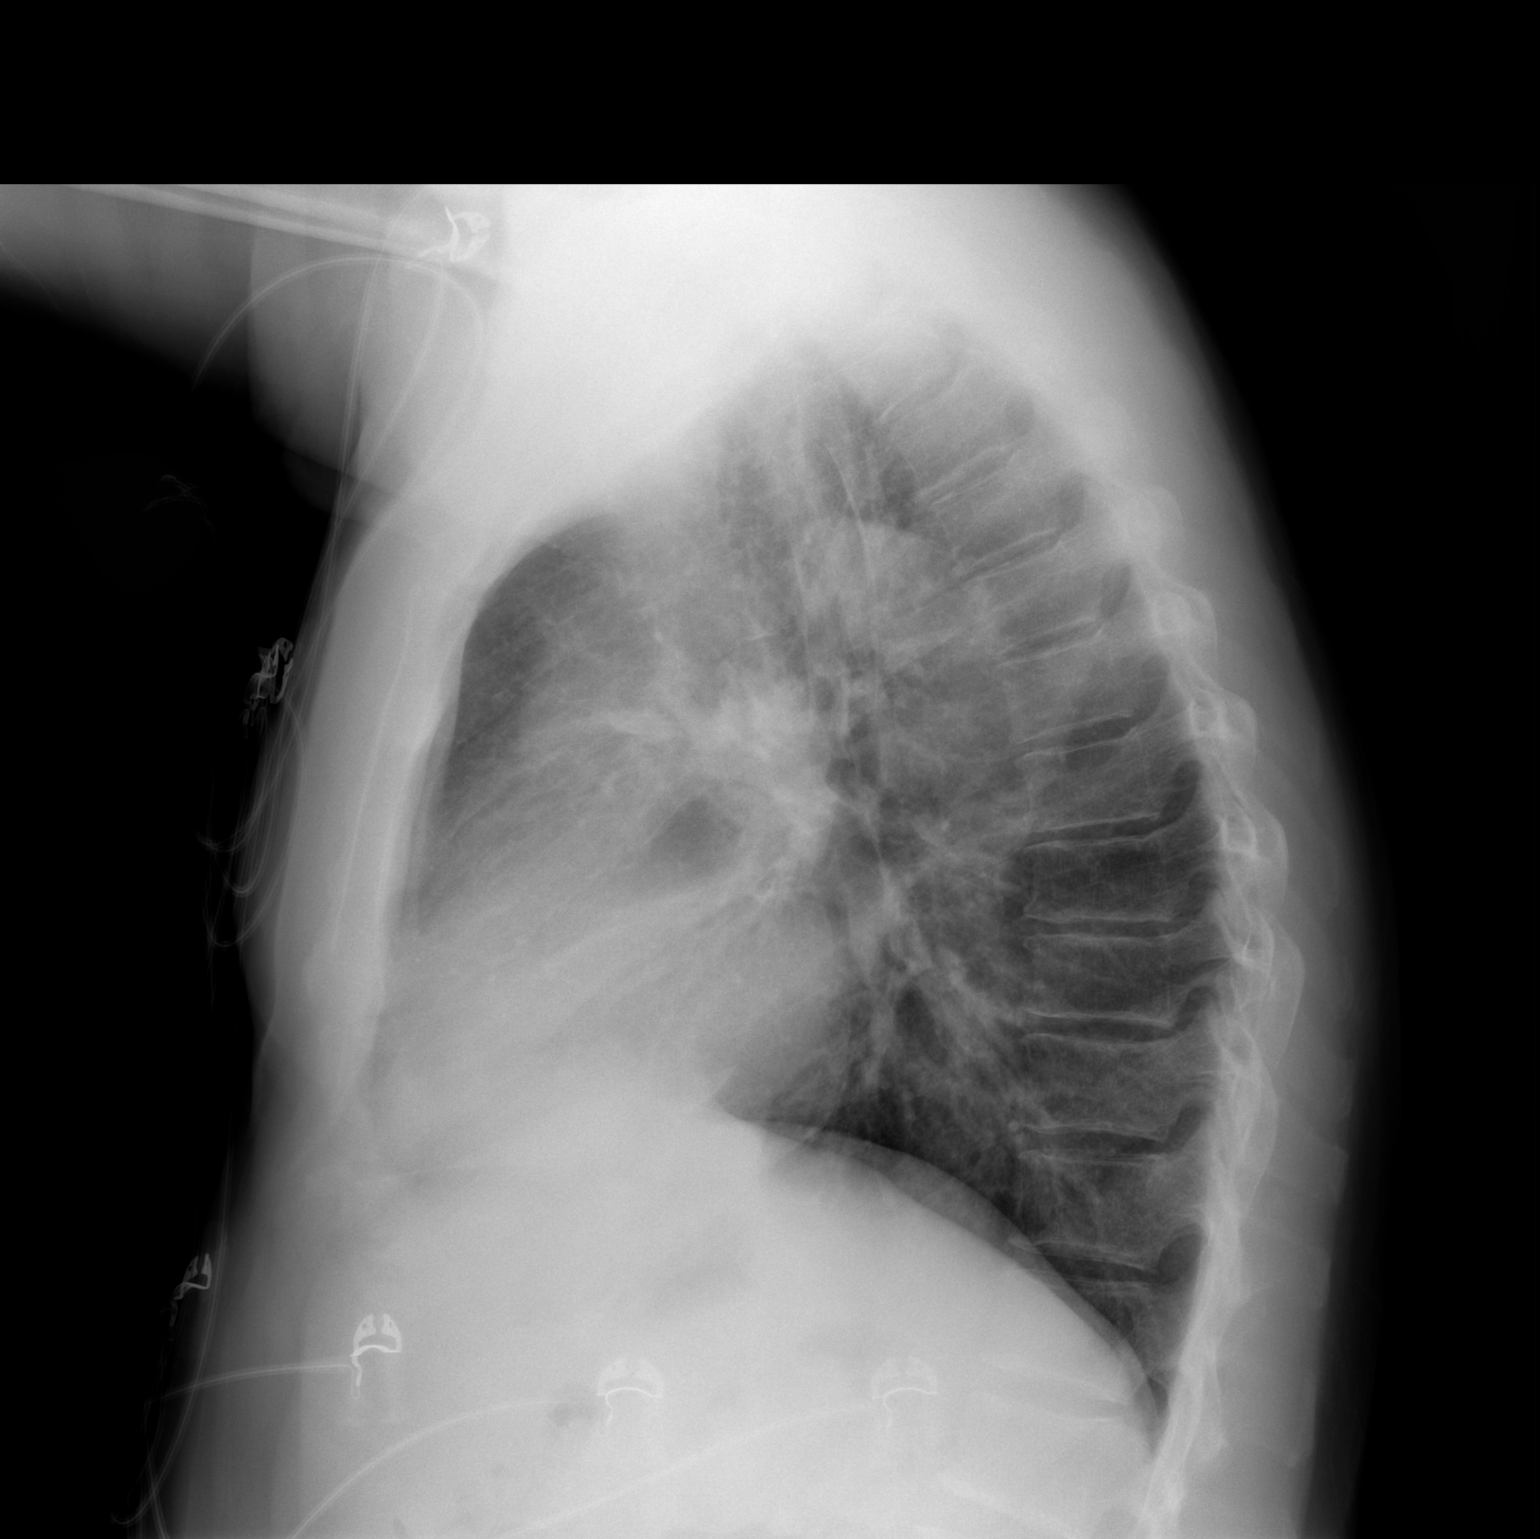

[2 of 2 positions shown; findings below may reference images not displayed]

FINDINGS: Lungs are adequately inflated demonstrate focal airspace
opacification over the left upper lobe suggesting pneumonia. No
effusion. Cardiomediastinal silhouette and remainder the exam is
unchanged.
IMPRESSION: Airspace opacification over the left upper lobe suggesting a
pneumonia. Recommend follow-up chest radiograph to resolution.

## 2019-10-12 ENCOUNTER — Telehealth: Payer: Self-pay | Admitting: Internal Medicine

## 2019-10-16 NOTE — Progress Notes (Signed)
Nurse connected with patient 10/17/19 at  8:00 AM EDT by a telephone enabled telemedicine application and verified that I am speaking with the correct person using two identifiers. Patient stated full name and DOB. Patient gave permission to continue with virtual visit. Patient's location was at home and Nurse's location was at [place] office.   Subjective:   Brett Berry is a 69 y.o. male who presents for an Initial Medicare Annual Wellness Visit.  Pt still works full time at [employer].   Review of Systems No ROS.  Medicare Wellness Virtual Visit.  Visual/audio telehealth visit, UTA vital signs.   See social history for additional risk factors.  Home Safety/Smoke Alarms: Feels safe in home. Smoke alarms in place.  Lives in 1 story home. Son and granddaughter currently live w/ him.  Male:   CCS- 2013.   Recall 5 yrs. Ordered. PSA-  Lab Results  Component Value Date   PSA 2.22 03/02/2018   PSA 1.38 02/17/2016   PSA 1.55 12/29/2013      Objective:    Today's Vitals   10/17/19 0807  BP: (!) 110/51  Weight: 190 lb (86.2 kg)   Body mass index is 27.26 kg/m.  Advanced Directives 10/17/2019 09/29/2018 08/31/2018 08/24/2018 07/24/2018 08/19/2016  Does Patient Have a Medical Advance Directive? No No No No No No  Would patient like information on creating a medical advance directive? No - Patient declined No - Patient declined No - Patient declined No - Patient declined No - Patient declined No - Patient declined    Current Medications (verified) Outpatient Encounter Medications as of 10/17/2019  Medication Sig  . albuterol (PROVENTIL) (2.5 MG/3ML) 0.083% nebulizer solution Take 3 mLs (2.5 mg total) by nebulization every 4 (four) hours as needed for wheezing or shortness of breath.  Marland Kitchen aspirin EC 81 MG tablet Take 1 tablet (81 mg total) by mouth daily.  Marland Kitchen atorvastatin (LIPITOR) 10 MG tablet Take 1 tablet (10 mg total) by mouth at bedtime.  . budesonide-formoterol  (SYMBICORT) 160-4.5 MCG/ACT inhaler Inhale 2 puffs into the lungs 2 (two) times daily.  . celecoxib (CELEBREX) 100 MG capsule Take 1 capsule (100 mg total) by mouth 2 (two) times daily as needed for moderate pain.  . Coenzyme Q10 (CO Q 10 PO) Take 1 capsule by mouth daily.   Marland Kitchen esomeprazole (NEXIUM) 40 MG capsule Take 1 capsule (40 mg total) by mouth 2 (two) times daily before a meal.  . fluticasone (FLONASE) 50 MCG/ACT nasal spray Place 2 sprays into the nose daily.    Marland Kitchen levalbuterol (XOPENEX HFA) 45 MCG/ACT inhaler Inhale 1-2 puffs into the lungs every 4 (four) hours as needed for wheezing or shortness of breath.  . magnesium oxide (MAG-OX) 400 MG tablet Take 400 mg by mouth daily.  . methocarbamol (ROBAXIN) 500 MG tablet Take 1 tablet (500 mg total) by mouth every 8 (eight) hours as needed.  . Multiple Vitamin (MULTIVITAMIN WITH MINERALS) TABS tablet Take 1 tablet by mouth daily.  . Omega-3 Fatty Acids (FISH OIL) 1000 MG CAPS Take 1,000 mg by mouth 2 (two) times daily.    No facility-administered encounter medications on file as of 10/17/2019.    Allergies (verified) Patient has no known allergies.   History: Past Medical History:  Diagnosis Date  . Allergic rhinitis   . Asthma   . Bilateral knee pain 2016   x-ray showed medial compartment arthritis, worse on right than left  . Erectile dysfunction 07/30/2014  .  GERD (gastroesophageal reflux disease)   . Hearing difficulty    has aids  . History of kidney stones   . Hyperlipidemia    intolerant to zocor  . Pneumonia   . Sinusitis, chronic    Past Surgical History:  Procedure Laterality Date  . CARPAL TUNNEL RELEASE Right 06/2018  . CATARACT EXTRACTION Right 2016  . MASTOIDECTOMY     right   . right hand surg     for infection in '07  . SINUS ENDO WITH FUSION    . TOE SURGERY     Left Great toe  . TOTAL KNEE ARTHROPLASTY Bilateral 08/30/2018  . TOTAL KNEE ARTHROPLASTY Bilateral 08/30/2018   Procedure: BILATERAL TOTAL  KNEE ARTHROPLASTY;  Surgeon: Meredith Pel, MD;  Location: [place];  Service: Orthopedics;  Laterality: Bilateral;   Family History  Problem Relation Age of Onset  . COPD Mother   . Breast cancer Mother   . Alzheimer's disease Father   . Lung cancer Father        smoker  . Prostate cancer Father 81       died age 75  . Heart disease Father        age 10  . Colon cancer Neg Hx    Social History   Socioeconomic History  . Marital status: Single    Spouse name: Not on file  . Number of children: 1  . Years of education: Not on file  . Highest education level: Not on file  Occupational History  . Occupation: Environmental education officer: [employer]  Tobacco Use  . Smoking status: Former Smoker    Quit date: 09/08/1973    Years since quitting: 46.1  . Smokeless tobacco: Never Used  Substance and Sexual Activity  . Alcohol use: Yes    Comment: 2-3 times week  . Drug use: No  . Sexual activity: Yes    Partners: Female  Other Topics Concern  . Not on file  Social History Narrative   HSG. Married '76- 6 years divorced ; '85. 1 son- '86.    Lost wife 12-2014   Social Determinants of Health   Financial Resource Strain: Low Risk   . Difficulty of Paying Living Expenses: Not hard at all  Food Insecurity: No Food Insecurity  . Worried About Charity fundraiser in the Last Year: Never true  . Ran Out of Food in the Last Year: Never true  Transportation Needs: No Transportation Needs  . Lack of Transportation (Medical): No  . Lack of Transportation (Non-Medical): No  Physical Activity:   . Days of Exercise per Week:   . Minutes of Exercise per Session:   Stress:   . Feeling of Stress :   Social Connections:   . Frequency of Communication with Friends and Family:   . Frequency of Social Gatherings with Friends and Family:   . Attends Religious Services:   . Active Member of Clubs or Organizations:   . Attends Archivist Meetings:   Marland Kitchen  Marital Status:    Tobacco Counseling Counseling given: Not Answered   Clinical Intake:     Pain : No/denies pain    Activities of Daily Living In your present state of health, do you have any difficulty performing the following activities: 10/17/2019 04/19/2019  Hearing? Y N  Comment wears hearing aids -  Vision? N N  Difficulty concentrating or making decisions? N N  Walking or climbing stairs?  N N  Dressing or bathing? N N  Doing errands, shopping? N N  Preparing Food and eating ? N -  Using the Toilet? N -  In the past six months, have you accidently leaked urine? N -  Do you have problems with loss of bowel control? N -  Managing your Medications? N -  Managing your Finances? N -  Housekeeping or managing your Housekeeping? N -  Some recent data might be hidden     Immunizations and Health Maintenance Immunization History  Administered Date(s) Administered  . Fluad Quad(high Dose 65+) 04/19/2019  . Influenza Split 07/03/2014  . Influenza, Seasonal, Injecte, Preservative Fre 04/22/2015  . Influenza,inj,Quad PF,6+ Mos 05/22/2018  . Influenza-Unspecified 04/09/2016, 05/07/2017  . Pneumococcal Conjugate-13 12/29/2013  . Pneumococcal Polysaccharide-23 09/09/2011  . Tdap 09/09/2011  . Zoster 01/08/2015  . Zoster Recombinat (Shingrix) 03/02/2018, 07/04/2018   Health Maintenance Due  Topic Date Due  . COLONOSCOPY  09/24/2016    Patient Care Team: Colon Branch, MD as PCP - General (Internal Medicine) Rozetta Nunnery, MD Harold Hedge, Darrick Grinder, MD (Allergy and Immunology) Marlou Sa Tonna Corner, MD as Consulting Physician (Orthopedic Surgery)  Indicate any recent Medical Services you may have received from other than [facility] providers in the past year (date may be approximate).    Assessment:   This is a routine wellness examination for Yasim. Physical assessment deferred to PCP.  Hearing/Vision screen Unable to assess. This visit is enabled though telemedicine  due to Covid 19.   Dietary issues and exercise activities discussed: Current Exercise Habits: Home exercise routine, Type of exercise: walking(11000-18000 steps per day), Frequency (Times/Week): 5, Intensity: Mild, Exercise limited by: None identified Diet (meal preparation, eat out, water intake, caffeinated beverages, dairy products, fruits and vegetables): 24 hr recall Breakfast: 2 pk cheese crackers Lunch: salad Dinner:  Fried Astronomer    . DIET - EAT MORE FRUITS AND VEGETABLES     And eat less fried food.      Depression Screen PHQ 2/9 Scores 10/17/2019 04/19/2019 03/02/2018 08/17/2016  PHQ - 2 Score 0 0 0 0    Fall Risk Fall Risk  10/17/2019 04/19/2019 03/02/2018 08/17/2016 02/17/2016  Falls in the past year? 0 0 No No No  Number falls in past yr: 0 - - - -  Injury with Fall? 0 - - - -  Follow up Education provided;Falls prevention discussed Falls evaluation completed - - -   Cognitive Function:   Ad8 score reviewed for issues:  Issues making decisions:no  Less interest in hobbies / activities:no  Repeats questions, stories (family complaining):no  Trouble using ordinary gadgets (microwave, computer, phone):no  Forgets the month or year: no  Mismanaging finances: no  Remembering appts:no  Daily problems with thinking and/or memory:no Ad8 score is=0       Screening Tests Health Maintenance  Topic Date Due  . COLONOSCOPY  09/24/2016  . PNA vac Low Risk Adult (2 of 2 - PPSV23) 03/02/2020 (Originally 11/14/2017)  . TETANUS/TDAP  09/08/2021  . INFLUENZA VACCINE  Completed  . Hepatitis C Screening  Completed       Plan:  Please schedule your next medicare wellness visit with me in 1 yr.  Continue to eat heart healthy diet (full of fruits, vegetables, whole grains, lean protein, water--limit salt, fat, and sugar intake) and increase physical activity as tolerated.  Bring a copy of your living will and/or healthcare power of attorney to your next  office visit.  I have ordered your colonoscopy. Please schedule.    I have personally reviewed and noted the following in the patient's chart:   . Medical and social history . Use of alcohol, tobacco or illicit drugs  . Current medications and supplements . Functional ability and status . Nutritional status . Physical activity . Advanced directives . List of other physicians . Hospitalizations, surgeries, and ER visits in previous 12 months . Vitals . Screenings to include cognitive, depression, and falls . Referrals and appointments  In addition, I have reviewed and discussed with patient certain preventive protocols, quality metrics, and best practice recommendations. A written personalized care plan for preventive services as well as general preventive health recommendations were provided to patient.     Shela Nevin, [place]   10/17/2019

## 2019-10-17 ENCOUNTER — Other Ambulatory Visit: Payer: Self-pay

## 2019-10-17 ENCOUNTER — Encounter: Payer: Self-pay | Admitting: *Deleted

## 2019-10-17 ENCOUNTER — Ambulatory Visit (INDEPENDENT_AMBULATORY_CARE_PROVIDER_SITE_OTHER): Payer: Medicare Other | Admitting: *Deleted

## 2019-10-17 VITALS — BP 110/51 | Wt 190.0 lb

## 2019-10-17 DIAGNOSIS — Z1211 Encounter for screening for malignant neoplasm of colon: Secondary | ICD-10-CM

## 2019-10-17 DIAGNOSIS — Z0000 Encounter for general adult medical examination without abnormal findings: Secondary | ICD-10-CM

## 2019-10-17 NOTE — Patient Instructions (Signed)
Please schedule your next medicare wellness visit with me in 1 yr.  Continue to eat heart healthy diet (full of fruits, vegetables, whole grains, lean protein, water--limit salt, fat, and sugar intake) and increase physical activity as tolerated.  Bring a copy of your living will and/or healthcare power of attorney to your next office visit.  I have ordered your colonoscopy. Please schedule.    [name] , Thank you for taking time to come for your Medicare Wellness Visit. I appreciate your ongoing commitment to your health goals. Please review the following plan we discussed and let me know if I can assist you in the future.   These are the goals we discussed: Goals    . DIET - EAT MORE FRUITS AND VEGETABLES     And eat less fried food.       This is a list of the screening recommended for you and due dates:  Health Maintenance  Topic Date Due  . Colon Cancer Screening  09/24/2016  . Pneumonia vaccines (2 of 2 - PPSV23) 03/02/2020*  . Tetanus Vaccine  09/08/2021  . Flu Shot  Completed  .  Hepatitis C: One time screening is recommended by Center for Disease Control  (CDC) for  adults born from [birth year] through 1963.   Completed  *Topic was postponed. The date shown is not the original due date.    Preventive Care 45 Years and Older, Male Preventive care refers to lifestyle choices and visits with your health care provider that can promote health and wellness. This includes:  A yearly physical exam. This is also called an annual well check.  Regular dental and eye exams.  Immunizations.  Screening for certain conditions.  Healthy lifestyle choices, such as diet and exercise. What can I expect for my preventive care visit? Physical exam Your health care provider will check:  Height and weight. These may be used to calculate body mass index (BMI), which is a measurement that tells if you are at a healthy weight.  Heart rate and blood pressure.  Your skin for abnormal  spots. Counseling Your health care provider may ask you questions about:  Alcohol, tobacco, and drug use.  Emotional well-being.  Home and relationship well-being.  Sexual activity.  Eating habits.  History of falls.  Memory and ability to understand (cognition).  Work and work Statistician. What immunizations do I need?  Influenza (flu) vaccine  This is recommended every year. Tetanus, diphtheria, and pertussis (Tdap) vaccine  You may need a Td booster every 10 years. Varicella (chickenpox) vaccine  You may need this vaccine if you have not already been vaccinated. Zoster (shingles) vaccine  You may need this after age 38. Pneumococcal conjugate (PCV13) vaccine  One dose is recommended after age 31. Pneumococcal polysaccharide (PPSV23) vaccine  One dose is recommended after age 29. Measles, mumps, and rubella (MMR) vaccine  You may need at least one dose of MMR if you were born in 1957 or later. You may also need a second dose. Meningococcal conjugate (MenACWY) vaccine  You may need this if you have certain conditions. Hepatitis A vaccine  You may need this if you have certain conditions or if you travel or work in places where you may be exposed to hepatitis A. Hepatitis B vaccine  You may need this if you have certain conditions or if you travel or work in places where you may be exposed to hepatitis B. Haemophilus influenzae type b (Hib) vaccine  You may need  this if you have certain conditions. You may receive vaccines as individual doses or as more than one vaccine together in one shot (combination vaccines). Talk with your health care provider about the risks and benefits of combination vaccines. What tests do I need? Blood tests  Lipid and cholesterol levels. These may be checked every 5 years, or more frequently depending on your overall health.  Hepatitis C test.  Hepatitis B test. Screening  Lung cancer screening. You may have this screening  every year starting at age 2 if you have a 30-pack-year history of smoking and currently smoke or have quit within the past 15 years.  Colorectal cancer screening. All adults should have this screening starting at age 51 and continuing until age 69. Your health care provider may recommend screening at age 40 if you are at increased risk. You will have tests every 1-10 years, depending on your results and the type of screening test.  Prostate cancer screening. Recommendations will vary depending on your family history and other risks.  Diabetes screening. This is done by checking your blood sugar (glucose) after you have not eaten for a while (fasting). You may have this done every 1-3 years.  Abdominal aortic aneurysm (AAA) screening. You may need this if you are a current or former smoker.  Sexually transmitted disease (STD) testing. Follow these instructions at home: Eating and drinking  Eat a diet that includes fresh fruits and vegetables, whole grains, lean protein, and low-fat dairy products. Limit your intake of foods with high amounts of sugar, saturated fats, and salt.  Take vitamin and mineral supplements as recommended by your health care provider.  Do not drink alcohol if your health care provider tells you not to drink.  If you drink alcohol: ? Limit how much you have to 0-2 drinks a day. ? Be aware of how much alcohol is in your drink. In the [place] one drink equals one 12 oz bottle of beer (355 mL), one 5 oz glass of wine (148 mL), or one 1 oz glass of hard liquor (44 mL). Lifestyle  Take daily care of your teeth and gums.  Stay active. Exercise for at least 30 minutes on 5 or more days each week.  Do not use any products that contain nicotine or tobacco, such as cigarettes, e-cigarettes, and chewing tobacco. If you need help quitting, ask your health care provider.  If you are sexually active, practice safe sex. Use a condom or other form of protection to prevent STIs  (sexually transmitted infections).  Talk with your health care provider about taking a low-dose aspirin or statin. What's next?  Visit your health care provider once a year for a well check visit.  Ask your health care provider how often you should have your eyes and teeth checked.  Stay up to date on all vaccines. This information is not intended to replace advice given to you by your health care provider. Make sure you discuss any questions you have with your health care provider. Document Revised: 07/14/2018 Document Reviewed: 07/14/2018 Elsevier Patient Education  2020 [employer].

## 2019-10-18 ENCOUNTER — Ambulatory Visit (INDEPENDENT_AMBULATORY_CARE_PROVIDER_SITE_OTHER): Payer: Medicare Other | Admitting: Internal Medicine

## 2019-10-18 ENCOUNTER — Other Ambulatory Visit: Payer: Self-pay

## 2019-10-18 ENCOUNTER — Encounter: Payer: Self-pay | Admitting: Internal Medicine

## 2019-10-18 ENCOUNTER — Encounter: Payer: Self-pay | Admitting: Gastroenterology

## 2019-10-18 VITALS — BP 146/58 | HR 51 | Temp 97.1°F | Resp 16 | Ht 70.0 in | Wt 191.1 lb

## 2019-10-18 DIAGNOSIS — J4520 Mild intermittent asthma, uncomplicated: Secondary | ICD-10-CM | POA: Diagnosis not present

## 2019-10-18 DIAGNOSIS — E785 Hyperlipidemia, unspecified: Secondary | ICD-10-CM | POA: Diagnosis not present

## 2019-10-18 LAB — COMPREHENSIVE METABOLIC PANEL
ALT: 19 U/L (ref 0–53)
AST: 18 U/L (ref 0–37)
Albumin: 3.8 g/dL (ref 3.5–5.2)
Alkaline Phosphatase: 51 U/L (ref 39–117)
BUN: 26 mg/dL — ABNORMAL HIGH (ref 6–23)
CO2: 30 mEq/L (ref 19–32)
Calcium: 9 mg/dL (ref 8.4–10.5)
Chloride: 105 mEq/L (ref 96–112)
Creatinine, Ser: 1.19 mg/dL (ref 0.40–1.50)
GFR: 60.99 mL/min (ref >60.00)
Glucose, Bld: 109 mg/dL — ABNORMAL HIGH (ref 70–99)
Potassium: 4.8 mEq/L (ref 3.5–5.1)
Sodium: 139 mEq/L (ref 135–145)
Total Bilirubin: 0.5 mg/dL (ref 0.2–1.2)
Total Protein: 6.5 g/dL (ref 6.0–8.3)

## 2019-10-18 LAB — LIPID PANEL
Cholesterol: 149 mg/dL (ref 0–200)
HDL: 49.3 mg/dL (ref >39.00)
LDL Cholesterol: 86 mg/dL (ref 0–99)
NonHDL: 99.64
Total CHOL/HDL Ratio: 3
Triglycerides: 70 mg/dL (ref 0.0–149.0)
VLDL: 14 mg/dL (ref 0.0–40.0)

## 2019-10-18 NOTE — Progress Notes (Signed)
Pre visit review using our clinic review tool, if applicable. No additional management support is needed unless otherwise documented below in the visit note. 

## 2019-10-18 NOTE — Progress Notes (Signed)
Subjective:    Patient ID: Brett Berry, male    DOB: 11/04/1950, 69 y.o.   MRN: [id number]  DOS:  10/18/2019 Type of visit - description: Follow-up Started taking Lipitor a few months ago, no apparent side effects. We review his ambulatory BPs Discussed treatment of asthma. Complaint of 1 day history of right ear "gurgling"    Review of Systems Denies fever chills. Mild sinus and nose congestion consistent with history of previous allergies Some nasal discharge in the last few days, occasionally discharge is green, again he thinks is related to allergies.  Past Medical History:  Diagnosis Date  . Allergic rhinitis   . Asthma   . Bilateral knee pain 2016   x-ray showed medial compartment arthritis, worse on right than left  . Erectile dysfunction 07/30/2014  . GERD (gastroesophageal reflux disease)   . Hearing difficulty    has aids  . History of kidney stones   . Hyperlipidemia    intolerant to zocor  . Pneumonia   . Sinusitis, chronic     Past Surgical History:  Procedure Laterality Date  . CARPAL TUNNEL RELEASE Right 06/2018  . CATARACT EXTRACTION Right 2016  . MASTOIDECTOMY     right   . right hand surg     for infection in '07  . SINUS ENDO WITH FUSION    . TOE SURGERY     Left Great toe  . TOTAL KNEE ARTHROPLASTY Bilateral 08/30/2018  . TOTAL KNEE ARTHROPLASTY Bilateral 08/30/2018   Procedure: BILATERAL TOTAL KNEE ARTHROPLASTY;  Surgeon: Meredith Pel, MD;  Location: [place];  Service: Orthopedics;  Laterality: Bilateral;    Allergies as of 10/18/2019   No Known Allergies     Medication List       Accurate as of October 18, 2019 11:59 PM. If you have any questions, ask your nurse or doctor.        albuterol (2.5 MG/3ML) 0.083% nebulizer solution Commonly known as: PROVENTIL Take 3 mLs (2.5 mg total) by nebulization every 4 (four) hours as needed for wheezing or shortness of breath.   aspirin EC 81 MG tablet Take 1 tablet (81 mg total) by  mouth daily.   atorvastatin 10 MG tablet Commonly known as: LIPITOR Take 1 tablet (10 mg total) by mouth at bedtime.   budesonide-formoterol 160-4.5 MCG/ACT inhaler Commonly known as: Symbicort Inhale 2 puffs into the lungs 2 (two) times daily.   celecoxib 100 MG capsule Commonly known as: CELEBREX Take 1 capsule (100 mg total) by mouth 2 (two) times daily as needed for moderate pain.   CO Q 10 PO Take 1 capsule by mouth daily.   esomeprazole 40 MG capsule Commonly known as: NEXIUM Take 1 capsule (40 mg total) by mouth 2 (two) times daily before a meal.   Fish Oil 1000 MG Caps Take 1,000 mg by mouth 2 (two) times daily.   fluticasone 50 MCG/ACT nasal spray Commonly known as: FLONASE Place 2 sprays into the nose daily.   levalbuterol 45 MCG/ACT inhaler Commonly known as: XOPENEX HFA Inhale 1-2 puffs into the lungs every 4 (four) hours as needed for wheezing or shortness of breath.   magnesium oxide 400 MG tablet Commonly known as: MAG-OX Take 400 mg by mouth daily.   methocarbamol 500 MG tablet Commonly known as: Robaxin Take 1 tablet (500 mg total) by mouth every 8 (eight) hours as needed.   multivitamin with minerals Tabs tablet Take 1 tablet by mouth daily.  Objective:   Physical Exam BP (!) 146/58 (BP Location: Left Arm, Patient Position: Sitting, Cuff Size: Normal)   Pulse (!) 51   Temp (!) 97.1 F (36.2 C) (Temporal)   Resp 16   Ht 5\' 10"  (1.778 m)   Wt 191 lb 2 oz (86.7 kg)   SpO2 99%   BMI 27.42 kg/m  General:   Well developed, NAD, BMI noted. HEENT:  Normocephalic . Face symmetric, atraumatic Nose slightly congested. Right ear: Canal normal, he has a clean perforation of the TM without discharge, redness. Sinuses no TTP Lungs:  CTA B Normal respiratory effort, no intercostal retractions, no accessory muscle use. Heart: RRR,  no murmur.  Lower extremities: no pretibial edema bilaterally  Skin: Not pale. Not jaundice Neurologic:   alert & oriented X3.  Speech normal, gait appropriate for age and unassisted Psych--  Cognition and judgment appear intact.  Cooperative with normal attention span and concentration.  Behavior appropriate. No anxious or depressed appearing.      Assessment       Assessment Hyperlipidemia ---intolerant to simvastatin Asthma GERD E.D. 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  Check a CMP and FLP Asthma: Good compliance with Symbicort, using the rescue inhaler once or twice a month. DJD: On daily Celebrex, checking kidney function. R Perforated TM: Used to have a tube there but now was told to leave the area alone.  No evidence of infection. Had a CXR 08/25/2018: I could not find official report but I contacted  Dr. Delbert Phenix, he look at the imagings and the previous pneumonia completely resolved. Elevated BP: Check BPs every other day, BPs range from 110/57-127/61 RTC 6 months CPX   This visit occurred during the SARS-CoV-2 public health emergency.  Safety protocols were in place, including screening questions prior to the visit, additional usage of staff PPE, and extensive cleaning of exam room while observing appropriate contact time as indicated for disinfecting solutions.

## 2019-10-18 NOTE — Patient Instructions (Addendum)
GO TO THE LAB : Get the blood work     GO TO [place] Come back for a physical exam in 6 months , please make an appointment

## 2019-10-19 NOTE — Assessment & Plan Note (Signed)
Hyperlipidemia: Since the last visit he started atorvastatin, good compliance and tolerance.  Check a CMP and FLP Asthma: Good compliance with Symbicort, using the rescue inhaler once or twice a month. DJD: On daily Celebrex, checking kidney function. R Perforated TM: Used to have a tube there but now was told to leave the area alone.  No evidence of infection. Had a CXR 08/25/2018: I could not find official report but I contacted  Dr. Delbert Phenix, he look at the imagings and the previous pneumonia completely resolved. Elevated BP: Check BPs every other day, BPs range from 110/57-127/61 RTC 6 months CPX

## 2019-10-30 ENCOUNTER — Other Ambulatory Visit: Payer: Self-pay

## 2019-10-30 ENCOUNTER — Ambulatory Visit (AMBULATORY_SURGERY_CENTER): Payer: Self-pay | Admitting: *Deleted

## 2019-10-30 VITALS — Temp 97.3°F | Ht 70.0 in | Wt 190.0 lb

## 2019-10-30 DIAGNOSIS — Z86010 Personal history of colonic polyps: Secondary | ICD-10-CM

## 2019-10-30 DIAGNOSIS — Z01818 Encounter for other preprocedural examination: Secondary | ICD-10-CM

## 2019-10-30 NOTE — Progress Notes (Signed)
Patient is here in-person for PV. Patient denies any allergies to eggs or soy. Patient denies any problems with anesthesia/sedation. Patient denies any oxygen use at home. Patient denies taking any diet/weight loss medications or blood thinners. Patient is not being treated for MRSA or C-diff. EMMI education assisgned to the patient for the procedure, this was explained and instructions given to patient. COVID-19 screening test is on 4/22, the pt is aware.  Patient is aware of our care-partner policy and 0000000 safety protocol.

## 2019-11-23 ENCOUNTER — Other Ambulatory Visit: Payer: Self-pay | Admitting: Gastroenterology

## 2019-11-23 ENCOUNTER — Ambulatory Visit (INDEPENDENT_AMBULATORY_CARE_PROVIDER_SITE_OTHER): Payer: Medicare Other

## 2019-11-23 ENCOUNTER — Encounter: Payer: Self-pay | Admitting: Gastroenterology

## 2019-11-23 DIAGNOSIS — Z1159 Encounter for screening for other viral diseases: Secondary | ICD-10-CM

## 2019-11-24 LAB — SARS CORONAVIRUS 2 (TAT 6-24 HRS): SARS Coronavirus 2: NEGATIVE

## 2019-11-27 ENCOUNTER — Encounter: Payer: Self-pay | Admitting: Gastroenterology

## 2019-11-27 ENCOUNTER — Ambulatory Visit (AMBULATORY_SURGERY_CENTER): Payer: Medicare Other | Admitting: Gastroenterology

## 2019-11-27 ENCOUNTER — Other Ambulatory Visit: Payer: Self-pay

## 2019-11-27 VITALS — BP 109/64 | HR 50 | Temp 97.1°F | Resp 9 | Ht 70.0 in | Wt 190.0 lb

## 2019-11-27 DIAGNOSIS — Z86010 Personal history of colonic polyps: Secondary | ICD-10-CM

## 2019-11-27 DIAGNOSIS — D120 Benign neoplasm of cecum: Secondary | ICD-10-CM | POA: Diagnosis not present

## 2019-11-27 MED ORDER — SODIUM CHLORIDE 0.9 % IV SOLN
500.0000 mL | Freq: Once | INTRAVENOUS | Status: DC
Start: 2019-11-27 — End: 2019-11-27

## 2019-11-27 NOTE — Progress Notes (Signed)
PT taken to PACU. Monitors in place. VSS. Report given to RN. 

## 2019-11-27 NOTE — Op Note (Signed)
[facility] Patient Name: Brett Berry Procedure Date: 11/27/2019 11:23 AM MRN: [id number] Endoscopist: Milus Banister , MD Age: 69 Referring MD:  Date of Birth: [birth year] Gender: Male Account #: [id number] Procedure:                Colonoscopy Indications:              High risk colon cancer surveillance: Personal                            history of colonic polyps: Colonoscopy 2013 single                            subCM adenoma Medicines:                Monitored Anesthesia Care Procedure:                Pre-Anesthesia Assessment:                           - Prior to the procedure, a History and Physical                            was performed, and patient medications and                            allergies were reviewed. The patient's tolerance of                            previous anesthesia was also reviewed. The risks                            and benefits of the procedure and the sedation                            options and risks were discussed with the patient.                            All questions were answered, and informed consent                            was obtained. Prior Anticoagulants: The patient has                            taken no previous anticoagulant or antiplatelet                            agents. ASA Grade Assessment: II - A patient with                            mild systemic disease. After reviewing the risks                            and benefits, the patient was deemed in  satisfactory condition to undergo the procedure.                           After obtaining informed consent, the colonoscope                            was passed under direct vision. Throughout the                            procedure, the patient's blood pressure, pulse, and                            oxygen saturations were monitored continuously. The                            Colonoscope was introduced through the anus and                           advanced to the the cecum, identified by                            appendiceal orifice and ileocecal valve. The                            colonoscopy was performed without difficulty. The                            patient tolerated the procedure well. The quality                            of the bowel preparation was good. The ileocecal                            valve, appendiceal orifice, and rectum were                            photographed. Scope In: 11:25:21 AM Scope Out: 11:38:32 AM Scope Withdrawal Time: 0 hours 10 minutes 29 seconds  Total Procedure Duration: 0 hours 13 minutes 11 seconds  Findings:                 A 3 mm polyp was found in the cecum. The polyp was                            sessile. The polyp was removed with a cold snare.                            Resection and retrieval were complete.                           The exam was otherwise without abnormality on                            direct and retroflexion views. Complications:  No immediate complications. Estimated blood loss:                            None. Estimated Blood Loss:     Estimated blood loss: none. Impression:               - One 3 mm polyp in the cecum, removed with a cold                            snare. Resected and retrieved.                           - The examination was otherwise normal on direct                            and retroflexion views. Recommendation:           - Patient has a contact number available for                            emergencies. The signs and symptoms of potential                            delayed complications were discussed with the                            patient. Return to normal activities tomorrow.                            Written discharge instructions were provided to the                            patient.                           - Resume previous diet.                           - Continue present  medications.                           - Await pathology results. Milus Banister, MD 11/27/2019 11:40:32 AM This report has been signed electronically.

## 2019-11-27 NOTE — Progress Notes (Signed)
Temp by JB Vitals by DT  Pt's states no medical or surgical changes since previsit or office visit. 

## 2019-11-27 NOTE — Patient Instructions (Signed)
HANDOUTS PROVIDED ON: POLYPS  The polyp removed today have been sent for pathology.  The results can take 1-3 weeks to receive.  When your next colonoscopy should occur will be based on the pathology results.    You may resume your previous diet and medication schedule.  Thank you for allowing us to care for you today!!!   YOU HAD AN ENDOSCOPIC PROCEDURE TODAY AT THE [facility]:   Refer to the procedure report that was given to you for any specific questions about what was found during the examination.  If the procedure report does not answer your questions, please call your gastroenterologist to clarify.  If you requested that your care partner not be given the details of your procedure findings, then the procedure report has been included in a sealed envelope for you to review at your convenience later.  YOU SHOULD EXPECT: Some feelings of bloating in the abdomen. Passage of more gas than usual.  Walking can help get rid of the air that was put into your GI tract during the procedure and reduce the bloating. If you had a lower endoscopy (such as a colonoscopy or flexible sigmoidoscopy) you may notice spotting of blood in your stool or on the toilet paper. If you underwent a bowel prep for your procedure, you may not have a normal bowel movement for a few days.  Please Note:  You might notice some irritation and congestion in your nose or some drainage.  This is from the oxygen used during your procedure.  There is no need for concern and it should clear up in a day or so.  SYMPTOMS TO REPORT IMMEDIATELY:   Following lower endoscopy (colonoscopy or flexible sigmoidoscopy):  Excessive amounts of blood in the stool  Significant tenderness or worsening of abdominal pains  Swelling of the abdomen that is new, acute  Fever of 100F or higher  For urgent or emergent issues, a gastroenterologist can be reached at any hour by calling [phone]. Do not use MyChart messaging for  urgent concerns.    DIET:  We do recommend a small meal at first, but then you may proceed to your regular diet.  Drink plenty of fluids but you should avoid alcoholic beverages for 24 hours.  ACTIVITY:  You should plan to take it easy for the rest of today and you should NOT DRIVE or use heavy machinery until tomorrow (because of the sedation medicines used during the test).    FOLLOW UP: Our staff will call the number listed on your records 48-72 hours following your procedure to check on you and address any questions or concerns that you may have regarding the information given to you following your procedure. If we do not reach you, we will leave a message.  We will attempt to reach you two times.  During this call, we will ask if you have developed any symptoms of COVID 19. If you develop any symptoms (ie: fever, flu-like symptoms, shortness of breath, cough etc.) before then, please call [phone].  If you test positive for Covid 19 in the 2 weeks post procedure, please call and report this information to us.    If any biopsies were taken you will be contacted by phone or by letter within the next 1-3 weeks.  Please call us at [phone] if you have not heard about the biopsies in 3 weeks.    SIGNATURES/CONFIDENTIALITY: You and/or your care partner have signed paperwork which will be entered into   your electronic medical record.  These signatures attest to the fact that that the information above on your After Visit Summary has been reviewed and is understood.  Full responsibility of the confidentiality of this discharge information lies with you and/or your care-partner.

## 2019-11-27 NOTE — Progress Notes (Signed)
Called to room to assist during endoscopic procedure.  Patient ID and intended procedure confirmed with present staff. Received instructions for my participation in the procedure from the performing physician.  

## 2019-11-29 ENCOUNTER — Telehealth: Payer: Self-pay

## 2019-11-29 NOTE — Telephone Encounter (Signed)
Got it, thanks!

## 2019-11-29 NOTE — Telephone Encounter (Signed)
I did ask pt is he had any SOB, cough, or difficulty breathing.  Pt said he had COVID previously, any no COVID sx noted per pt.  I left that off the first note. maw

## 2019-11-29 NOTE — Telephone Encounter (Signed)
  Follow up Call-  Call back number 11/27/2019  Post procedure Call Back phone  # [phone]  Permission to leave phone message Yes  Some recent data might be hidden     Patient questions:  Do you have a fever, pain , or abdominal swelling? Yes.   Pain Score  0 *  Have you tolerated food without any problems? Yes.    Have you been able to return to your normal activities? Yes.    Do you have any questions about your discharge instructions: Diet   No. Medications  No. Follow up visit  No.  Do you have questions or concerns about your Care? Yes.    Actions: * If pain score is 4 or above: No action needed, pain <4.  1. Have you developed a fever since your procedure? no  2.   Have you had an respiratory symptoms (SOB or cough) since your procedure? no  3.   Have you tested positive for COVID 19 since your procedure no  4.   Have you had any family members/close contacts diagnosed with the COVID 19 since your procedure?  no   If yes to any of these questions please route to Joylene John, RN and Erenest Rasher, RN   Pt reported approximately 2 hours after getting home from Colonoscopy he had 99.7 temp, nausea and vomiting.  His reported his son called and spoke with "someone" and they asked if he had any anti-nausea med at his home.  Pt had phenergan, which he took and it did help his symptoms.  Yesterday evening he was able to eat a cheese sandwich and it stayed down.  He is back to normal this am and at work.  Pt to call us back if any other sx arises.  maw

## 2019-12-01 ENCOUNTER — Encounter: Payer: Self-pay | Admitting: Gastroenterology

## 2020-01-08 ENCOUNTER — Other Ambulatory Visit: Payer: Self-pay | Admitting: Surgical

## 2020-01-08 NOTE — Telephone Encounter (Signed)
Pls advise.  

## 2020-02-21 ENCOUNTER — Other Ambulatory Visit: Payer: Self-pay | Admitting: Internal Medicine

## 2020-02-21 DIAGNOSIS — J310 Chronic rhinitis: Secondary | ICD-10-CM

## 2020-04-18 ENCOUNTER — Other Ambulatory Visit: Payer: Self-pay | Admitting: Internal Medicine

## 2020-04-22 ENCOUNTER — Encounter: Payer: Self-pay | Admitting: Internal Medicine

## 2020-04-22 ENCOUNTER — Other Ambulatory Visit: Payer: Self-pay

## 2020-04-22 ENCOUNTER — Ambulatory Visit (INDEPENDENT_AMBULATORY_CARE_PROVIDER_SITE_OTHER): Payer: Medicare Other | Admitting: Internal Medicine

## 2020-04-22 VITALS — BP 126/80 | HR 53 | Temp 97.8°F | Resp 16 | Ht 70.0 in | Wt 194.1 lb

## 2020-04-22 DIAGNOSIS — Z23 Encounter for immunization: Secondary | ICD-10-CM

## 2020-04-22 DIAGNOSIS — E785 Hyperlipidemia, unspecified: Secondary | ICD-10-CM | POA: Diagnosis not present

## 2020-04-22 DIAGNOSIS — Z0000 Encounter for general adult medical examination without abnormal findings: Secondary | ICD-10-CM | POA: Diagnosis not present

## 2020-04-22 DIAGNOSIS — R7989 Other specified abnormal findings of blood chemistry: Secondary | ICD-10-CM

## 2020-04-22 NOTE — Progress Notes (Signed)
Pre visit review using our clinic review tool, if applicable. No additional management support is needed unless otherwise documented below in the visit note. 

## 2020-04-22 NOTE — Progress Notes (Signed)
Subjective:    Patient ID: Brett Berry, male    DOB: 03/02/1951, 69 y.o.   MRN: [id number]  DOS:  04/22/2020 Type of visit - description: CPX In general feeling well. No major concerns except that sometimes he has cramps -- only on the right calf. I ask about back pain that he does have occasional low back pain but denies any lower extremity paresthesias.  Review of Systems  Other than above, a 14 point review of systems is negative      Past Medical History:  Diagnosis Date  . Allergic rhinitis   . Asthma   . Bilateral knee pain 2016   x-ray showed medial compartment arthritis, worse on right than left  . Erectile dysfunction 07/30/2014  . GERD (gastroesophageal reflux disease)   . Hearing difficulty    has aids  . History of kidney stones   . Hyperlipidemia    intolerant to zocor  . Pneumonia   . Sinusitis, chronic     Past Surgical History:  Procedure Laterality Date  . CARPAL TUNNEL RELEASE Right 06/2018  . CATARACT EXTRACTION Right 2016  . COLONOSCOPY  09/25/2011  . MASTOIDECTOMY     right   . POLYPECTOMY    . right hand surg     for infection in '07  . SINUS ENDO WITH FUSION    . TOE SURGERY     Left Great toe  . TOTAL KNEE ARTHROPLASTY Bilateral 08/30/2018  . TOTAL KNEE ARTHROPLASTY Bilateral 08/30/2018   Procedure: BILATERAL TOTAL KNEE ARTHROPLASTY;  Surgeon: Meredith Pel, MD;  Location: [facility];  Service: Orthopedics;  Laterality: Bilateral;    Allergies as of 04/22/2020   No Known Allergies     Medication List       Accurate as of April 22, 2020 11:59 PM. If you have any questions, ask your nurse or doctor.        albuterol (2.5 MG/3ML) 0.083% nebulizer solution Commonly known as: PROVENTIL Take 3 mLs (2.5 mg total) by nebulization every 4 (four) hours as needed for wheezing or shortness of breath.   aspirin 81 MG EC tablet TAKE 1 TABLET BY MOUTH EVERY DAY   atorvastatin 10 MG tablet Commonly known as: LIPITOR Take 1 tablet  (10 mg total) by mouth at bedtime.   budesonide-formoterol 160-4.5 MCG/ACT inhaler Commonly known as: Symbicort Inhale 2 puffs into the lungs in the morning and at bedtime.   celecoxib 100 MG capsule Commonly known as: CELEBREX Take 1 capsule (100 mg total) by mouth 2 (two) times daily as needed for moderate pain.   CO Q 10 PO Take 1 capsule by mouth daily.   esomeprazole 40 MG capsule Commonly known as: NEXIUM Take 1 capsule (40 mg total) by mouth 2 (two) times daily before a meal.   Fish Oil 1000 MG Caps Take 1,000 mg by mouth 2 (two) times daily.   fluticasone 50 MCG/ACT nasal spray Commonly known as: FLONASE Place 2 sprays into the nose daily.   levalbuterol 45 MCG/ACT inhaler Commonly known as: XOPENEX HFA Inhale 1-2 puffs into the lungs every 4 (four) hours as needed for wheezing or shortness of breath.   magnesium oxide 400 MG tablet Commonly known as: MAG-OX Take 400 mg by mouth daily.   methocarbamol 500 MG tablet Commonly known as: Robaxin Take 1 tablet (500 mg total) by mouth every 8 (eight) hours as needed.   multivitamin with minerals Tabs tablet Take 1 tablet by mouth daily.  Objective:   Physical Exam BP 126/80 (BP Location: Left Arm)   Pulse (!) 53   Temp 97.8 F (36.6 C) (Oral)   Resp 16   Ht 5\' 10"  (1.778 m)   Wt 194 lb 2 oz (88.1 kg)   SpO2 99%   BMI 27.85 kg/m  General: Well developed, NAD, BMI noted Neck: No  thyromegaly  HEENT:  Normocephalic . Face symmetric, atraumatic Lungs:  CTA B Normal respiratory effort, no intercostal retractions, no accessory muscle use. Heart: RRR,  no murmur.  Abdomen:  Not distended, soft, non-tender. No rebound or rigidity.   Lower extremities: no pretibial edema bilaterally.  Good pedal pulses DRE: Normal sphincter tone, normal prostate, brown stools. Skin: Exposed areas without rash. Not pale. Not jaundice Neurologic:  alert & oriented X3.  Speech normal, gait appropriate for age and  unassisted Strength symmetric and appropriate for age.  Psych: Cognition and judgment appear intact.  Cooperative with normal attention span and concentration.  Behavior appropriate. No anxious or depressed appearing.     Assessment      Assessment Hyperlipidemia ---intolerant to simvastatin Asthma GERD E.D. 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  BP recheck 126/80. Hyperlipidemia: Last FLP very good on Lipitor, recheck labs Asthma: On Symbicort regularly, hardly ever uses rescue inhalers DJD: Takes Celebrex sporadically. Right calf cramps: Nocturnal, normal pedal pulses, recommend stretching before bedtime. RTC 1 year as long as he is feeling well and BPs are normal.   This visit occurred during the SARS-CoV-2 public health emergency.  Safety protocols were in place, including screening questions prior to the visit, additional usage of staff PPE, and extensive cleaning of exam room while observing appropriate contact time as indicated for disinfecting solutions.

## 2020-04-22 NOTE — Patient Instructions (Signed)
Check the  blood pressure regularly °BP GOAL is between 110/65 and  135/85. °If it is consistently higher or lower, let me know °  ° °GO TO THE LAB : Get the blood work   ° ° °GO TO THE FRONT DESK, PLEASE SCHEDULE YOUR APPOINTMENTS °Come back for   a physical exam in 1 year °

## 2020-04-23 ENCOUNTER — Encounter: Payer: Self-pay | Admitting: Internal Medicine

## 2020-04-23 LAB — CBC WITH DIFFERENTIAL/PLATELET
Absolute Monocytes: 672 cells/uL (ref 200–950)
Basophils Absolute: 60 cells/uL (ref 0–200)
Basophils Relative: 0.7 %
Eosinophils Absolute: 765 cells/uL — ABNORMAL HIGH (ref 15–500)
Eosinophils Relative: 9 %
HCT: 40.3 % (ref 38.5–50.0)
Hemoglobin: 13.7 g/dL (ref 13.2–17.1)
Lymphs Abs: 2967 cells/uL (ref 850–3900)
MCH: 33.1 pg — ABNORMAL HIGH (ref 27.0–33.0)
MCHC: 34 g/dL (ref 32.0–36.0)
MCV: 97.3 fL (ref 80.0–100.0)
MPV: 10.6 fL (ref 7.5–12.5)
Monocytes Relative: 7.9 %
Neutro Abs: 4038 cells/uL (ref 1500–7800)
Neutrophils Relative %: 47.5 %
Platelets: 273 10*3/uL (ref 140–400)
RBC: 4.14 10*6/uL — ABNORMAL LOW (ref 4.20–5.80)
RDW: 12.3 % (ref 11.0–15.0)
Total Lymphocyte: 34.9 %
WBC: 8.5 10*3/uL (ref 3.8–10.8)

## 2020-04-23 LAB — COMPREHENSIVE METABOLIC PANEL
AG Ratio: 1.3 (calc) (ref 1.0–2.5)
ALT: 27 U/L (ref 9–46)
AST: 28 U/L (ref 10–35)
Albumin: 4.3 g/dL (ref 3.6–5.1)
Alkaline phosphatase (APISO): 61 U/L (ref 35–144)
BUN/Creatinine Ratio: 21 (calc) (ref 6–22)
BUN: 28 mg/dL — ABNORMAL HIGH (ref 7–25)
CO2: 28 mmol/L (ref 20–32)
Calcium: 9.6 mg/dL (ref 8.6–10.3)
Chloride: 103 mmol/L (ref 98–110)
Creat: 1.32 mg/dL — ABNORMAL HIGH (ref 0.70–1.25)
Globulin: 3.2 g/dL (calc) (ref 1.9–3.7)
Glucose, Bld: 105 mg/dL — ABNORMAL HIGH (ref 65–99)
Potassium: 4.7 mmol/L (ref 3.5–5.3)
Sodium: 139 mmol/L (ref 135–146)
Total Bilirubin: 0.5 mg/dL (ref 0.2–1.2)
Total Protein: 7.5 g/dL (ref 6.1–8.1)

## 2020-04-23 LAB — LIPID PANEL
Cholesterol: 169 mg/dL (ref <200)
HDL: 47 mg/dL (ref >=40)
LDL Cholesterol (Calc): 97 mg/dL (calc)
Non-HDL Cholesterol (Calc): 122 mg/dL (calc) (ref <130)
Total CHOL/HDL Ratio: 3.6 (calc) (ref <5.0)
Triglycerides: 150 mg/dL — ABNORMAL HIGH (ref <150)

## 2020-04-23 LAB — PSA: PSA: 1.45 ng/mL (ref <=4.0)

## 2020-04-23 NOTE — Assessment & Plan Note (Signed)
Here for CPX BP elevated today upon arrival: 165/78, he checks daily, this morning at home 132/75.  BP recheck 126/80. Hyperlipidemia: Last FLP very good on Lipitor, recheck labs Asthma: On Symbicort regularly, hardly ever uses rescue inhalers DJD: Takes Celebrex sporadically. Right calf cramps: Nocturnal, normal pedal pulses, recommend stretching before bedtime. RTC 1 year as long as he is feeling well and BPs are normal.

## 2020-04-23 NOTE — Assessment & Plan Note (Signed)
-  Td 2013 - PNM shot 2013; PNM 13:  2015 -  zostavax 2016;  S/p  shingrex x 2 2019 - Covid vaccine hesitancy: Patient educated, pro >> cons - flu shot today -[facility]- 09/25/11-adenomatous polyps and diverticulosis; C-scope 11-2019, next per GI -DRE- PSA 2019:  [facility]  -Labs: CMP, FLP, CBC, TSH -Diet -exercise discussed, he is doing well

## 2020-04-25 NOTE — Addendum Note (Signed)
Addended byDamita Dunnings D on: 04/25/2020 01:14 PM   Modules accepted: Orders

## 2020-05-06 DIAGNOSIS — Z961 Presence of intraocular lens: Secondary | ICD-10-CM | POA: Diagnosis not present

## 2020-05-06 DIAGNOSIS — H2512 Age-related nuclear cataract, left eye: Secondary | ICD-10-CM | POA: Diagnosis not present

## 2020-05-06 DIAGNOSIS — H40013 Open angle with borderline findings, low risk, bilateral: Secondary | ICD-10-CM | POA: Diagnosis not present

## 2020-05-30 ENCOUNTER — Telehealth: Payer: Self-pay | Admitting: Internal Medicine

## 2020-05-30 NOTE — Telephone Encounter (Signed)
Due for a BMP, please arrange

## 2020-05-31 NOTE — Telephone Encounter (Signed)
Reminder mailed to Pt.

## 2020-08-02 ENCOUNTER — Other Ambulatory Visit: Payer: Self-pay | Admitting: Internal Medicine

## 2020-08-04 ENCOUNTER — Other Ambulatory Visit: Payer: Self-pay | Admitting: Internal Medicine

## 2020-09-23 ENCOUNTER — Other Ambulatory Visit (INDEPENDENT_AMBULATORY_CARE_PROVIDER_SITE_OTHER): Payer: Medicare HMO

## 2020-09-23 ENCOUNTER — Other Ambulatory Visit: Payer: Self-pay

## 2020-09-23 DIAGNOSIS — R7989 Other specified abnormal findings of blood chemistry: Secondary | ICD-10-CM | POA: Diagnosis not present

## 2020-09-23 LAB — BASIC METABOLIC PANEL
BUN: 21 mg/dL (ref 6–23)
CO2: 30 mEq/L (ref 19–32)
Calcium: 9.3 mg/dL (ref 8.4–10.5)
Chloride: 104 mEq/L (ref 96–112)
Creatinine, Ser: 1.16 mg/dL (ref 0.40–1.50)
GFR: 65.01 mL/min (ref >60.00)
Glucose, Bld: 107 mg/dL — ABNORMAL HIGH (ref 70–99)
Potassium: 4.3 mEq/L (ref 3.5–5.1)
Sodium: 139 mEq/L (ref 135–145)

## 2020-11-04 ENCOUNTER — Other Ambulatory Visit: Payer: Self-pay | Admitting: Internal Medicine

## 2021-02-12 ENCOUNTER — Ambulatory Visit (HOSPITAL_COMMUNITY)
Admission: EM | Admit: 2021-02-12 | Discharge: 2021-02-12 | Disposition: A | Discharge status: Home / Self Care | Payer: Medicare HMO | Attending: Emergency Medicine | Admitting: Emergency Medicine

## 2021-02-12 ENCOUNTER — Other Ambulatory Visit: Payer: Self-pay

## 2021-02-12 ENCOUNTER — Encounter (HOSPITAL_COMMUNITY): Payer: Self-pay

## 2021-02-12 DIAGNOSIS — J069 Acute upper respiratory infection, unspecified: Secondary | ICD-10-CM

## 2021-02-12 MED ORDER — ALBUTEROL SULFATE HFA 108 (90 BASE) MCG/ACT IN AERS: 2.0000 | INHALATION_SPRAY | RESPIRATORY_TRACT | 0 refills | Status: DC | PRN

## 2021-02-12 NOTE — ED Triage Notes (Signed)
Pt presents with chills, nasal congestion, and sore throat X 3 days.

## 2021-02-12 NOTE — ED Provider Notes (Signed)
[place]    CSN: [id number] Arrival date & time: 02/12/21  1051      History   Chief Complaint Chief Complaint  Patient presents with   URI    HPI Brett Berry is a 70 y.o. male.   Patient here for evaluation chills, congestion, and sore throat that has been ongoing for the past 3 days.  Reports taking an at home COVID test that was negative.  Reports taking OTC medications with relief.  Reports initially having some facial pain that has improved.  Denies any recent sick contacts. Denies any trauma, injury, or other precipitating event.  Denies any specific alleviating or aggravating factors.  Denies any fevers, chest pain, shortness of breath, N/V/D, numbness, tingling, weakness, abdominal pain, or headaches.     The history is provided by the patient.  URI Presenting symptoms: congestion, cough and sore throat    Past Medical History:  Diagnosis Date   Allergic rhinitis    Asthma    Bilateral knee pain 2016   x-ray showed medial compartment arthritis, worse on right than left   Erectile dysfunction 07/30/2014   GERD (gastroesophageal reflux disease)    Hearing difficulty    has aids   History of kidney stones    Hyperlipidemia    intolerant to zocor   Pneumonia    Sinusitis, chronic     Patient Active Problem List   Diagnosis Date Noted   Unilateral primary osteoarthritis, left knee    Unilateral primary osteoarthritis, right knee    Arthritis of knee 08/30/2018   GERD with esophagitis 08/19/2016   PCP NOTES >>>>>>>>>>>>>>>>>> 02/18/2016   DJD (knees, celebrex, ortho Dr Sharol Given) 01/08/2015   Erectile dysfunction 07/30/2014   Annual physical exam 12/29/2013   Non-functioning tympanostomy tube 09/10/2011   Dysphagia 09/10/2011   Chronic sinusitis 02/05/2010   HEADACHE 02/05/2010   Anxiety -insomnia 01/04/2009   Hyperlipidemia 01/04/2009   ALLERGIC RHINITIS 10/27/2007   Asthma 10/27/2007    Past Surgical History:  Procedure Laterality Date    CARPAL TUNNEL RELEASE Right 06/2018   CATARACT EXTRACTION Right 2016   COLONOSCOPY  09/25/2011   MASTOIDECTOMY     right    POLYPECTOMY     right hand surg     for infection in [place]     TOE SURGERY     Left Great toe   TOTAL KNEE ARTHROPLASTY Bilateral 08/30/2018   TOTAL KNEE ARTHROPLASTY Bilateral 08/30/2018   Procedure: BILATERAL TOTAL KNEE ARTHROPLASTY;  Surgeon: Meredith Pel, MD;  Location: [facility];  Service: Orthopedics;  Laterality: Bilateral;       Home Medications    Prior to Admission medications   Medication Sig Start Date End Date Taking? Authorizing Provider  albuterol (VENTOLIN HFA) 108 (90 Base) MCG/ACT inhaler Inhale 2 puffs into the lungs every 4 (four) hours as needed for wheezing or shortness of breath. 02/12/21  Yes [name], NP  esomeprazole (NEXIUM) 40 MG capsule Take 1 capsule (40 mg total) by mouth 2 (two) times daily before a meal. 08/05/20   Colon Branch, MD  aspirin 81 MG EC tablet TAKE 1 TABLET BY MOUTH EVERY DAY 01/08/20   [name], [name], PA-C  atorvastatin (LIPITOR) 10 MG tablet Take 1 tablet (10 mg total) by mouth at bedtime. 08/05/20   Colon Branch, MD  budesonide-formoterol [facility]) 160-4.5 MCG/ACT inhaler Inhale 2 puffs into the lungs in the morning and at bedtime. 02/22/20  Colon Branch, MD  celecoxib (CELEBREX) 100 MG capsule Take 1 capsule (100 mg total) by mouth 2 (two) times daily as needed for moderate pain. 11/04/20   Colon Branch, MD  Coenzyme Q10 (CO Q 10 PO) Take 1 capsule by mouth daily.     [provider]  fluticasone (FLONASE) 50 MCG/ACT nasal spray Place 2 sprays into the nose daily.      [provider]  levalbuterol Penne Lash HFA) 45 MCG/ACT inhaler Inhale 1-2 puffs into the lungs every 4 (four) hours as needed for wheezing or shortness of breath. 05/25/19   Colon Branch, MD  magnesium oxide (MAG-OX) 400 MG tablet Take 400 mg by mouth daily.    [provider]  methocarbamol  (ROBAXIN) 500 MG tablet Take 1 tablet (500 mg total) by mouth every 8 (eight) hours as needed. 09/11/19   [name], [name], PA-C  Multiple Vitamin (MULTIVITAMIN WITH MINERALS) TABS tablet Take 1 tablet by mouth daily.    [provider]  Omega-3 Fatty Acids (FISH OIL) 1000 MG CAPS Take 1,000 mg by mouth 2 (two) times daily.     [provider]    Family History Family History  Problem Relation Age of Onset   COPD Mother    Breast cancer Mother    Colon polyps Mother    Alzheimer's disease Father    Lung cancer Father        smoker   Prostate cancer Father 28       died age 80   Heart disease Father        age 19   Colon cancer Neg Hx    Esophageal cancer Neg Hx    Rectal cancer Neg Hx    Stomach cancer Neg Hx     Social History Social History   Tobacco Use   Smoking status: Former    Pack years: 0.00    Types: Cigarettes    Quit date: 09/08/1973    Years since quitting: 47.4   Smokeless tobacco: Never  Vaping Use   Vaping Use: Never used  Substance Use Topics   Alcohol use: Yes    Alcohol/week: 3.0 standard drinks    Types: 3 Cans of beer per week   Drug use: No     Allergies   Patient has no known allergies.   Review of Systems Review of Systems  HENT:  Positive for congestion, sinus pressure and sore throat. Negative for trouble swallowing and voice change.   Respiratory:  Positive for cough.     Physical Exam Triage Vital Signs ED Triage Vitals  Enc Vitals Group     BP 02/12/21 1141 (!) 149/87     Pulse Rate 02/12/21 1141 69     Resp 02/12/21 1141 20     Temp 02/12/21 1141 98.7 F (37.1 C)     Temp Source 02/12/21 1141 Oral     SpO2 02/12/21 1141 100 %     Weight --      Height --      Head Circumference --      Peak Flow --      Pain Score 02/12/21 1142 6     Pain Loc --      Pain Edu? --      Excl. in [place]? --    No data found.  Updated Vital Signs BP (!) 149/87 (BP Location: Right Arm)   Pulse 69   Temp 98.7 F (37.1  C) (Oral)  Resp 20   SpO2 100%   Visual Acuity Right Eye Distance:   Left Eye Distance:   Bilateral Distance:    Right Eye Near:   Left Eye Near:    Bilateral Near:     Physical Exam Vitals and nursing note reviewed.  Constitutional:      General: He is not in acute distress.    Appearance: Normal appearance. He is not ill-appearing, toxic-appearing or diaphoretic.  HENT:     Head: Normocephalic and atraumatic.     Nose: Congestion present.     Right Turbinates: Swollen.     Left Turbinates: Swollen.     Right Sinus: No maxillary sinus tenderness or frontal sinus tenderness.     Left Sinus: No maxillary sinus tenderness or frontal sinus tenderness.     Mouth/Throat:     Mouth: Mucous membranes are moist.     Pharynx: Oropharynx is clear. No oropharyngeal exudate or posterior oropharyngeal erythema.  Eyes:     Conjunctiva/sclera: Conjunctivae normal.  Cardiovascular:     Rate and Rhythm: Normal rate and regular rhythm.     Pulses: Normal pulses.     Heart sounds: Normal heart sounds.  Pulmonary:     Effort: Pulmonary effort is normal.     Breath sounds: Normal breath sounds.  Abdominal:     General: Abdomen is flat.  Musculoskeletal:        General: Normal range of motion.     Cervical back: Normal range of motion.  Skin:    General: Skin is warm and dry.  Neurological:     General: No focal deficit present.     Mental Status: He is alert and oriented to person, place, and time.  Psychiatric:        Mood and Affect: Mood normal.     UC Treatments / Results  Labs (all labs ordered are listed, but only abnormal results are displayed) Labs Reviewed - No data to display  EKG   Radiology No results found.  Procedures Procedures (including critical care time)  Medications Ordered in UC Medications - No data to display  Initial Impression / Assessment and Plan / UC Course  I have reviewed the triage vital signs and the nursing notes.  Pertinent labs &  imaging results that were available during my care of the patient were reviewed by me and considered in my medical decision making (see chart for details).    Assessment negative for red flags or concerns.  This is likely a viral URI.  Patient declines COVID test at this time as his at home test was negative.  Offered to prescribe medications for cough relief but declines.  Patient does have a history of asthma and reports needing a new inhaler which will be sent in.  Tylenol and/or ibuprofen as needed for fever and pain.  Discussed conservative symptom management as described in discharge instructions.  Follow-up with primary care as needed. Final Clinical Impressions(s) / UC Diagnoses   Final diagnoses:  Viral upper respiratory tract infection     Discharge Instructions      You can take Tylenol and/or Ibuprofen as needed for fever reduction and pain relief.   For cough: honey 1/2 to 1 teaspoon (you can dilute the honey in water or another fluid).  You can also use guaifenesin and dextromethorphan for cough. You can use a humidifier for chest congestion and cough.  If you don't have a humidifier, you can sit in the bathroom with the hot  shower running.     For sore throat: try warm salt water gargles, cepacol lozenges, throat spray, warm tea or water with lemon/honey, popsicles or ice, or OTC cold relief medicine for throat discomfort.    For congestion: take a daily anti-histamine like Zyrtec, Claritin, and a oral decongestant, such as pseudoephedrine.  You can also use Flonase 1-2 sprays in each nostril daily.    It is important to stay hydrated: drink plenty of fluids (water, gatorade/powerade/pedialyte, juices, or teas) to keep your throat moisturized and help further relieve irritation/discomfort.   Return or go to the Emergency Department if symptoms worsen or do not improve in the next few days.      ED Prescriptions     Medication Sig Dispense Auth. Provider   albuterol  (VENTOLIN HFA) 108 (90 Base) MCG/ACT inhaler Inhale 2 puffs into the lungs every 4 (four) hours as needed for wheezing or shortness of breath. 18 g [name], NP      PDMP not reviewed this encounter.   [name], NP 02/12/21 [phone]

## 2021-02-12 NOTE — Discharge Instructions (Addendum)
You can take Tylenol and/or Ibuprofen as needed for fever reduction and pain relief.   For cough: honey 1/2 to 1 teaspoon (you can dilute the honey in water or another fluid).  You can also use guaifenesin and dextromethorphan for cough. You can use a humidifier for chest congestion and cough.  If you don't have a humidifier, you can sit in the bathroom with the hot shower running.     For sore throat: try warm salt water gargles, cepacol lozenges, throat spray, warm tea or water with lemon/honey, popsicles or ice, or OTC cold relief medicine for throat discomfort.    For congestion: take a daily anti-histamine like Zyrtec, Claritin, and a oral decongestant, such as pseudoephedrine.  You can also use Flonase 1-2 sprays in each nostril daily.    It is important to stay hydrated: drink plenty of fluids (water, gatorade/powerade/pedialyte, juices, or teas) to keep your throat moisturized and help further relieve irritation/discomfort.   Return or go to the Emergency Department if symptoms worsen or do not improve in the next few days.

## 2021-02-15 ENCOUNTER — Encounter (HOSPITAL_BASED_OUTPATIENT_CLINIC_OR_DEPARTMENT_OTHER): Payer: Self-pay

## 2021-02-15 ENCOUNTER — Other Ambulatory Visit: Payer: Self-pay

## 2021-02-15 ENCOUNTER — Emergency Department (HOSPITAL_BASED_OUTPATIENT_CLINIC_OR_DEPARTMENT_OTHER)
Admission: EM | Admit: 2021-02-15 | Discharge: 2021-02-15 | Disposition: A | Discharge status: Home / Self Care | Payer: Medicare HMO | Attending: Emergency Medicine | Admitting: Emergency Medicine

## 2021-02-15 ENCOUNTER — Emergency Department (HOSPITAL_BASED_OUTPATIENT_CLINIC_OR_DEPARTMENT_OTHER): Payer: Medicare HMO

## 2021-02-15 DIAGNOSIS — Z87891 Personal history of nicotine dependence: Secondary | ICD-10-CM | POA: Insufficient documentation

## 2021-02-15 DIAGNOSIS — R059 Cough, unspecified: Secondary | ICD-10-CM | POA: Diagnosis present

## 2021-02-15 DIAGNOSIS — Z20822 Contact with and (suspected) exposure to covid-19: Secondary | ICD-10-CM | POA: Diagnosis not present

## 2021-02-15 DIAGNOSIS — J0110 Acute frontal sinusitis, unspecified: Secondary | ICD-10-CM | POA: Diagnosis not present

## 2021-02-15 DIAGNOSIS — J45909 Unspecified asthma, uncomplicated: Secondary | ICD-10-CM | POA: Insufficient documentation

## 2021-02-15 DIAGNOSIS — Z7951 Long term (current) use of inhaled steroids: Secondary | ICD-10-CM | POA: Insufficient documentation

## 2021-02-15 DIAGNOSIS — R918 Other nonspecific abnormal finding of lung field: Secondary | ICD-10-CM | POA: Diagnosis not present

## 2021-02-15 DIAGNOSIS — Z96653 Presence of artificial knee joint, bilateral: Secondary | ICD-10-CM | POA: Diagnosis not present

## 2021-02-15 DIAGNOSIS — J189 Pneumonia, unspecified organism: Secondary | ICD-10-CM

## 2021-02-15 LAB — RESP PANEL BY RT-PCR (FLU A&B, COVID) ARPGX2
Influenza A by PCR: NEGATIVE
Influenza B by PCR: NEGATIVE
SARS Coronavirus 2 by RT PCR: NEGATIVE

## 2021-02-15 MED ORDER — FLUTICASONE PROPIONATE 50 MCG/ACT NA SUSP: 2.0000 | Freq: Every day | NASAL | 0 refills | Status: AC

## 2021-02-15 MED ORDER — BENZONATATE 100 MG PO CAPS: 100.0000 mg | ORAL_CAPSULE | Freq: Three times a day (TID) | ORAL | 0 refills | Status: DC

## 2021-02-15 MED ORDER — ACETAMINOPHEN 500 MG PO TABS
1000.0000 mg | ORAL_TABLET | Freq: Once | ORAL | Status: AC
  Administered 2021-02-15: 1000 mg via ORAL
  Filled 2021-02-15: qty 2

## 2021-02-15 MED ORDER — AMOXICILLIN-POT CLAVULANATE 875-125 MG PO TABS: 1.0000 | ORAL_TABLET | Freq: Two times a day (BID) | ORAL | 0 refills | Status: DC

## 2021-02-15 NOTE — ED Triage Notes (Signed)
"  Cough, congestion, fever, chest pain since Monday, was seen at Urgent care on Wednesday and was diagnosed with sinus infection and I'm not getting better" per pt

## 2021-02-15 NOTE — ED Provider Notes (Signed)
[facility] EMERGENCY DEPARTMENT Provider Note   CSN: [id number] Arrival date & time: 02/15/21  1142     History Chief Complaint  Patient presents with   Facial Pain   Nasal Congestion    Brett Berry is a 70 y.o. male.  HPI Patient is a 70 year old gentleman with past medical history significant for allergic rhinitis, asthma, reflux, HLD, pneumonia, sinusitis  Patient states that he has had cough congestion sinus pressure fever of 102, dry cough which is recently become productive of some greenish sputum   He states that his symptoms began Monday which is 6 days ago.  He states that his symptoms are gradually worsened.  He was seen at an urgent care on Wednesday and was diagnosed with a sinus infection and told to take Tylenol and ibuprofen.  Denies any nausea or vomiting states that he does have a occasional sharp or burning sensation in his chest when he coughs but has no other chest pains denies any vomiting.  States that he has had some diaphoresis when he is febrile but states that after taking Tylenol his fever resolved and he is not diaphoretic.  Denies any abdominal pain, changes in bowel movements.  Denies any lightheadedness or dizziness  No recent surgeries, hospitalization, long travel, hemoptysis, estrogen containing OCP, cancer history.  No unilateral leg swelling.  No history of PE or VTE.     Past Medical History:  Diagnosis Date   Allergic rhinitis    Asthma    Bilateral knee pain 2016   x-ray showed medial compartment arthritis, worse on right than left   Erectile dysfunction 07/30/2014   GERD (gastroesophageal reflux disease)    Hearing difficulty    has aids   History of kidney stones    Hyperlipidemia    intolerant to zocor   Pneumonia    Sinusitis, chronic     Patient Active Problem List   Diagnosis Date Noted   Unilateral primary osteoarthritis, left knee    Unilateral primary osteoarthritis, right knee    Arthritis of knee  08/30/2018   GERD with esophagitis 08/19/2016   PCP NOTES >>>>>>>>>>>>>>>>>> 02/18/2016   DJD (knees, celebrex, ortho Dr Sharol Given) 01/08/2015   Erectile dysfunction 07/30/2014   Annual physical exam 12/29/2013   Non-functioning tympanostomy tube 09/10/2011   Dysphagia 09/10/2011   Chronic sinusitis 02/05/2010   HEADACHE 02/05/2010   Anxiety -insomnia 01/04/2009   Hyperlipidemia 01/04/2009   ALLERGIC RHINITIS 10/27/2007   Asthma 10/27/2007    Past Surgical History:  Procedure Laterality Date   CARPAL TUNNEL RELEASE Right 06/2018   CATARACT EXTRACTION Right 2016   COLONOSCOPY  09/25/2011   MASTOIDECTOMY     right    POLYPECTOMY     right hand surg     for infection in [place]     TOE SURGERY     Left Great toe   TOTAL KNEE ARTHROPLASTY Bilateral 08/30/2018   TOTAL KNEE ARTHROPLASTY Bilateral 08/30/2018   Procedure: BILATERAL TOTAL KNEE ARTHROPLASTY;  Surgeon: Meredith Pel, MD;  Location: [facility];  Service: Orthopedics;  Laterality: Bilateral;       Family History  Problem Relation Age of Onset   COPD Mother    Breast cancer Mother    Colon polyps Mother    Alzheimer's disease Father    Lung cancer Father        smoker   Prostate cancer Father 60       died age  3   Heart disease Father        age 31   Colon cancer Neg Hx    Esophageal cancer Neg Hx    Rectal cancer Neg Hx    Stomach cancer Neg Hx     Social History   Tobacco Use   Smoking status: Former    Types: Cigarettes    Quit date: 09/08/1973    Years since quitting: 47.4   Smokeless tobacco: Never  Vaping Use   Vaping Use: Never used  Substance Use Topics   Alcohol use: Yes    Alcohol/week: 3.0 standard drinks    Types: 3 Cans of beer per week   Drug use: No    Home Medications Prior to Admission medications   Medication Sig Start Date End Date Taking? Authorizing Provider  amoxicillin-clavulanate (AUGMENTIN) 875-125 MG tablet Take 1 tablet by mouth every 12 (twelve)  hours. 02/15/21  Yes [name] S, PA  benzonatate (TESSALON) 100 MG capsule Take 1 capsule (100 mg total) by mouth every 8 (eight) hours. 02/15/21  Yes [name], Ova Freshwater S, PA  esomeprazole (NEXIUM) 40 MG capsule Take 1 capsule (40 mg total) by mouth 2 (two) times daily before a meal. 08/05/20   Paz, [name], MD  fluticasone [facility]) 50 MCG/ACT nasal spray Place 2 sprays into both nostrils daily for 14 days. 02/15/21 03/01/21 Yes [name] S, PA  albuterol (VENTOLIN HFA) 108 (90 Base) MCG/ACT inhaler Inhale 2 puffs into the lungs every 4 (four) hours as needed for wheezing or shortness of breath. 02/12/21   [name], NP  aspirin 81 MG EC tablet TAKE 1 TABLET BY MOUTH EVERY DAY 01/08/20   [name], [name], PA-C  atorvastatin (LIPITOR) 10 MG tablet Take 1 tablet (10 mg total) by mouth at bedtime. 08/05/20   Colon Branch, MD  budesonide-formoterol [facility]) 160-4.5 MCG/ACT inhaler Inhale 2 puffs into the lungs in the morning and at bedtime. 02/22/20   Colon Branch, MD  celecoxib (CELEBREX) 100 MG capsule Take 1 capsule (100 mg total) by mouth 2 (two) times daily as needed for moderate pain. 11/04/20   Colon Branch, MD  Coenzyme Q10 (CO Q 10 PO) Take 1 capsule by mouth daily.     [provider]  levalbuterol Penne Lash HFA) 45 MCG/ACT inhaler Inhale 1-2 puffs into the lungs every 4 (four) hours as needed for wheezing or shortness of breath. 05/25/19   Colon Branch, MD  magnesium oxide (MAG-OX) 400 MG tablet Take 400 mg by mouth daily.    [provider]  methocarbamol (ROBAXIN) 500 MG tablet Take 1 tablet (500 mg total) by mouth every 8 (eight) hours as needed. 09/11/19   [name], [name], PA-C  Multiple Vitamin (MULTIVITAMIN WITH MINERALS) TABS tablet Take 1 tablet by mouth daily.    [provider]  Omega-3 Fatty Acids (FISH OIL) 1000 MG CAPS Take 1,000 mg by mouth 2 (two) times daily.     [provider]    Allergies    Patient has no known allergies.  Review of  Systems   Review of Systems  Constitutional:  Positive for chills, fatigue and fever.  HENT:  Positive for congestion.   Eyes:  Negative for pain.  Respiratory:  Positive for cough. Negative for shortness of breath.   Cardiovascular:  Positive for chest pain (with coughing). Negative for leg swelling.  Gastrointestinal:  Negative for abdominal pain, diarrhea, nausea and vomiting.  Genitourinary:  Negative for  dysuria.  Musculoskeletal:  Negative for myalgias.  Skin:  Negative for rash.  Neurological:  Negative for dizziness and headaches.   Physical Exam Updated Vital Signs BP (!) 189/66 (BP Location: Right Arm)   Pulse 74   Temp 100 F (37.8 C) (Oral)   Resp (!) 22   Ht 6' (1.829 m)   Wt 86.2 kg   SpO2 96%   BMI 25.77 kg/m   Physical Exam Vitals and nursing note reviewed.  Constitutional:      General: He is not in acute distress.    Comments: Pleasant well-appearing 70 year old.  In no acute distress.  Sitting comfortably in bed.  Able answer questions appropriately follow commands. No increased work of breathing. Speaking in full sentences.  HENT:     Head: Normocephalic and atraumatic.     Nose: Nose normal.  Eyes:     General: No scleral icterus. Cardiovascular:     Rate and Rhythm: Normal rate and regular rhythm.     Pulses: Normal pulses.     Heart sounds: Normal heart sounds.  Pulmonary:     Effort: Pulmonary effort is normal. No respiratory distress.     Breath sounds: No wheezing.     Comments: No wheezing.  Lungs are clear to auscultation all fields.  Speaking in full sentences.  No tachypnea. Chest:     Chest wall: Tenderness present.  Abdominal:     Palpations: Abdomen is soft.     Tenderness: There is no abdominal tenderness.  Musculoskeletal:     Cervical back: Normal range of motion.     Right lower leg: No edema.     Left lower leg: No edema.  Skin:    General: Skin is warm and dry.     Capillary Refill: Capillary refill takes less than 2  seconds.  Neurological:     Mental Status: He is alert. Mental status is at baseline.  Psychiatric:        Mood and Affect: Mood normal.        Behavior: Behavior normal.    ED Results / Procedures / Treatments   Labs (all labs ordered are listed, but only abnormal results are displayed) Labs Reviewed  RESP PANEL BY RT-PCR (FLU A&B, COVID) ARPGX2    EKG None  Radiology No results found.  Procedures Procedures   Medications Ordered in ED Medications  acetaminophen (TYLENOL) tablet 1,000 mg (has no administration in time range)    ED Course  I have reviewed the triage vital signs and the nursing notes.  Pertinent labs & imaging results that were available during my care of the patient were reviewed by me and considered in my medical decision making (see chart for details).    MDM Rules/Calculators/A&P                          Patient is a 70 year old male With medical history detailed in HPI  Physical exam is relatively unremarkable apart from tenderness to palpation of the sinuses  Patient has had fever 102 at home has had ongoing symptoms now for 6 days and is significantly tender over the frontal sinuses.  Given his fever and significant symptoms we will treat with Augmentin.  This also covers patient for community-acquired pneumonia I did obtain a chest x-ray which showed a small left upper lobe opacity concerning for pneumonia he understands that he will need to have a follow-up appoint with his PCP scheduled so that he  can have repeat chest x-ray to exclude malignancy  He is not a smoker although he did smoke previously he states about 20 years ago or more.  I did offer patient cardiac work-up given his pain in his chest with coughing however he declines further work-up and given my relatively low suspicion for ACS, PE, dissection, tamponade, myo-pericarditis or pericarditis.   EKG without ischemia.  No ST-T wave abnormalities of note.  Reviewed by attending  physician.  Mild left axis deviation.  Patient is well-appearing.  Her vital signs within normal limits apart from hypertension is not tachypneic or tachycardic.  Low-grade elevated temperature here in ER but has had high grade fever at home. Will test for covid but ultimately treat for sinusitis presumed bacterial given high fever.  Also treating for pneumonia.  Return precautions given.  He states he understand these.  He also understands need for close follow-up with PCP and follow-up chest x-ray.  NOACH CALVILLO was evaluated in Emergency Department on 02/15/2021 for the symptoms described in the history of present illness. He was evaluated in the context of the global COVID-19 pandemic, which necessitated consideration that the patient might be at risk for infection with the SARS-CoV-2 virus that causes COVID-19. Institutional protocols and algorithms that pertain to the evaluation of patients at risk for COVID-19 are in a state of rapid change based on information released by regulatory bodies including the CDC and federal and state organizations. These policies and algorithms were followed during the patient's care in the ED.   Final Clinical Impression(s) / ED Diagnoses Final diagnoses:  Acute frontal sinusitis, recurrence not specified    Rx / DC Orders ED Discharge Orders          Ordered    fluticasone (FLONASE) 50 MCG/ACT nasal spray  Daily        02/15/21 1233    benzonatate (TESSALON) 100 MG capsule  Every 8 hours        02/15/21 1233    amoxicillin-clavulanate (AUGMENTIN) 875-125 MG tablet  Every 12 hours        02/15/21 1233             Tedd Sias, [place] 02/15/21 1534    Blanchie Dessert, MD 02/16/21 2218

## 2021-02-15 NOTE — Discharge Instructions (Addendum)
Please take Augmentin the antibiotic as prescribed for the entire course.  Please use Flonase 2 sprays in each nostril twice daily.  Please drink plenty of water.  Your COVID test will result in 2 to 3 hours.  He may follow-up on the result of this on MyChart app.  Please schedule an appointment with your primary care provider to follow-up on today's visit.   I recommend doing sinus rinses with a Nettie pot or similar product.  Warm compresses to the face may help as well.  Use Tylenol and ibuprofen as instructed below.  I also prescribed you benzonatate as a cough medicine however clear her sinuses will ultimately help with your cough more than anything.

## 2021-02-15 NOTE — ED Notes (Signed)
PO fluid given, tolerated well. No n/v.

## 2021-02-15 NOTE — ED Notes (Signed)
Pt provided discharge instructions and prescription information. Pt was given the opportunity to ask questions and questions were answered. Discharge signature not obtained in the setting of the COVID-19 pandemic in order to reduce high touch surfaces.  ° °

## 2021-04-22 ENCOUNTER — Other Ambulatory Visit: Payer: Self-pay | Admitting: Internal Medicine

## 2021-04-22 DIAGNOSIS — J310 Chronic rhinitis: Secondary | ICD-10-CM

## 2021-04-24 ENCOUNTER — Encounter: Payer: Medicare Other | Admitting: Internal Medicine

## 2021-05-19 ENCOUNTER — Other Ambulatory Visit: Payer: Self-pay

## 2021-05-19 ENCOUNTER — Ambulatory Visit (HOSPITAL_BASED_OUTPATIENT_CLINIC_OR_DEPARTMENT_OTHER)
Admission: RE | Admit: 2021-05-19 | Discharge: 2021-05-19 | Disposition: A | Discharge status: Home / Self Care | Payer: Medicare HMO | Source: Ambulatory Visit | Attending: Internal Medicine | Admitting: Internal Medicine

## 2021-05-19 ENCOUNTER — Ambulatory Visit (INDEPENDENT_AMBULATORY_CARE_PROVIDER_SITE_OTHER): Payer: Medicare HMO | Admitting: Internal Medicine

## 2021-05-19 ENCOUNTER — Encounter: Payer: Self-pay | Admitting: Internal Medicine

## 2021-05-19 VITALS — BP 138/70 | HR 49 | Temp 98.2°F | Resp 16 | Ht 70.0 in | Wt 190.5 lb

## 2021-05-19 DIAGNOSIS — E785 Hyperlipidemia, unspecified: Secondary | ICD-10-CM

## 2021-05-19 DIAGNOSIS — M47814 Spondylosis without myelopathy or radiculopathy, thoracic region: Secondary | ICD-10-CM | POA: Diagnosis not present

## 2021-05-19 DIAGNOSIS — Z0000 Encounter for general adult medical examination without abnormal findings: Secondary | ICD-10-CM

## 2021-05-19 DIAGNOSIS — J4520 Mild intermittent asthma, uncomplicated: Secondary | ICD-10-CM | POA: Diagnosis not present

## 2021-05-19 DIAGNOSIS — R739 Hyperglycemia, unspecified: Secondary | ICD-10-CM

## 2021-05-19 DIAGNOSIS — Z23 Encounter for immunization: Secondary | ICD-10-CM

## 2021-05-19 DIAGNOSIS — J189 Pneumonia, unspecified organism: Secondary | ICD-10-CM | POA: Insufficient documentation

## 2021-05-19 DIAGNOSIS — Z0001 Encounter for general adult medical examination with abnormal findings: Secondary | ICD-10-CM

## 2021-05-19 NOTE — Progress Notes (Signed)
Subjective:    Patient ID: Brett Berry, male    DOB: 11-27-1950, 70 y.o.   MRN: [id number]  DOS:  05/19/2021 Type of visit - description: CPX  Here for CPX. Doing well. Has no major concerns. He is noted to be bradycardic but denies weakness, dizziness.   Does have some symptoms consistent with CTS on the L and, plans to see Ortho  Review of Systems  Other than above, a 14 point review of systems is negative       Past Medical History:  Diagnosis Date   Allergic rhinitis    Asthma    Bilateral knee pain 2016   x-ray showed medial compartment arthritis, worse on right than left   Erectile dysfunction 07/30/2014   GERD (gastroesophageal reflux disease)    Hearing difficulty    has aids   History of kidney stones    Hyperlipidemia    intolerant to zocor   Pneumonia    Sinusitis, chronic     Past Surgical History:  Procedure Laterality Date   CARPAL TUNNEL RELEASE Right 06/2018   CATARACT EXTRACTION Right 2016   COLONOSCOPY  09/25/2011   MASTOIDECTOMY     right    POLYPECTOMY     right hand surg     for infection in [place]     TOE SURGERY     Left Great toe   TOTAL KNEE ARTHROPLASTY Bilateral 08/30/2018   TOTAL KNEE ARTHROPLASTY Bilateral 08/30/2018   Procedure: BILATERAL TOTAL KNEE ARTHROPLASTY;  Surgeon: Meredith Pel, MD;  Location: [facility];  Service: Orthopedics;  Laterality: Bilateral;   Social History   Socioeconomic History   Marital status: Widowed    Spouse name: Not on file   Number of children: 1   Years of education: Not on file   Highest education level: Not on file  Occupational History   Occupation: Heritage manager dept    Employer: [employer]  Tobacco Use   Smoking status: Former    Types: Cigarettes    Quit date: 09/08/1973    Years since quitting: 47.7   Smokeless tobacco: Never   Tobacco comments:    Quit I the 1980s  Vaping Use   Vaping Use: Never used  Substance and Sexual Activity    Alcohol use: Yes    Alcohol/week: 3.0 standard drinks    Types: 3 Cans of beer per week   Drug use: No   Sexual activity: Yes    Partners: Female  Other Topics Concern   Not on file  Social History Narrative   HSG. Married '76- 6 years divorced ; '85. 1 son- '86.    Lost wife 12-2014   Social Determinants of Health   Financial Resource Strain: Not on file  Food Insecurity: Not on file  Transportation Needs: Not on file  Physical Activity: Not on file  Stress: Not on file  Social Connections: Not on file  Intimate Partner Violence: Not on file    Allergies as of 05/19/2021   No Known Allergies      Medication List        Accurate as of May 19, 2021  9:09 PM. If you have any questions, ask your nurse or doctor.          STOP taking these medications    amoxicillin-clavulanate 875-125 MG tablet Commonly known as: AUGMENTIN Stopped by: Kathlene November, MD   aspirin 81 MG EC tablet Stopped by:  Kathlene November, MD   benzonatate 100 MG capsule Commonly known as: TESSALON Stopped by: Kathlene November, MD   methocarbamol 500 MG tablet Commonly known as: Robaxin Stopped by: Kathlene November, MD       TAKE these medications    albuterol 108 (90 Base) MCG/ACT inhaler Commonly known as: VENTOLIN HFA Inhale 2 puffs into the lungs every 4 (four) hours as needed for wheezing or shortness of breath.   atorvastatin 10 MG tablet Commonly known as: LIPITOR TAKE 1 TABLET BY MOUTH EVERYDAY AT BEDTIME   budesonide-formoterol 160-4.5 MCG/ACT inhaler Commonly known as: Symbicort Inhale 2 puffs into the lungs 2 (two) times daily.   celecoxib 100 MG capsule Commonly known as: CELEBREX Take 1 capsule (100 mg total) by mouth 2 (two) times daily as needed for moderate pain.   CO Q 10 PO Take 1 capsule by mouth daily.   esomeprazole 40 MG capsule Commonly known as: NEXIUM Take 1 capsule (40 mg total) by mouth 2 (two) times daily before a meal.   Fish Oil 1000 MG Caps Take 1,000 mg by mouth  2 (two) times daily.   fluticasone 50 MCG/ACT nasal spray Commonly known as: FLONASE Place 2 sprays into both nostrils daily for 14 days.   levalbuterol 45 MCG/ACT inhaler Commonly known as: XOPENEX HFA Inhale 1-2 puffs into the lungs every 4 (four) hours as needed for wheezing or shortness of breath.   magnesium oxide 400 MG tablet Commonly known as: MAG-OX Take 400 mg by mouth daily.   multivitamin with minerals Tabs tablet Take 1 tablet by mouth daily.           Objective:   Physical Exam BP 138/70 (BP Location: Left Arm, Patient Position: Sitting, Cuff Size: Small)   Pulse (!) 49   Temp 98.2 F (36.8 C) (Oral)   Resp 16   Ht 5\' 10"  (1.778 m)   Wt 190 lb 8 oz (86.4 kg)   SpO2 98%   BMI 27.33 kg/m  General: Well developed, NAD, BMI noted Neck: No  thyromegaly  HEENT:  Normocephalic . Face symmetric, atraumatic Lungs:  CTA B Normal respiratory effort, no intercostal retractions, no accessory muscle use. Heart: Bradycardic, no murmur.  Abdomen:  Not distended, soft, non-tender. No rebound or rigidity.   Lower extremities: no pretibial edema bilaterally  Skin: Exposed areas without rash. Not pale. Not jaundice Neurologic:  alert & oriented X3.  Speech normal, gait appropriate for age and unassisted Strength symmetric and appropriate for age.  Psych: Cognition and judgment appear intact.  Cooperative with normal attention span and concentration.  Behavior appropriate. No anxious or depressed appearing.     Assessment      Assessment Hyperlipidemia ---intolerant to simvastatin Asthma GERD E.D. 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 Bradycardia: Has on and off bradycardia, no sxs, we agreed on observation, last EKG was 01-2021 Pneumonia: Dx at the ER 3 months ago, follow-up chest x-ray ordered  Elevated BP: Ambulatory BPs normal per patient RTC 1 year    In addition to CPX, I review his chronic medical problems, also recommended a chest x-ray to follow-up pneumonia. This visit occurred during the SARS-CoV-2 public health emergency.  Safety protocols  were in place, including screening questions prior to the visit, additional usage of staff PPE, and extensive cleaning of exam room while observing appropriate contact time as indicated for disinfecting solutions.

## 2021-05-19 NOTE — Assessment & Plan Note (Signed)
-  Td 2013: Recommended - PNM shot 2013; PNM 13:  2015 -  zostavax 2016;  S/p  shingrex x 2 2019 - Covid vaccine : Discussed and recommended.  Declined  - flu shot today -[facility]- 09/25/11-adenomatous polyps and diverticulosis; C-scope 11-2019, next per GI -DRE and PSA 04-2020 within normal.  + FH, check PSA -Labs: CMP, FLP, CBC, A1c, PSA -Diet -exercise discussed, he is doing well -POA discussed

## 2021-05-19 NOTE — Assessment & Plan Note (Signed)
Here for CPX Hyperlipidemia: On atorvastatin, checking labs Asthma: On Symbicort twice daily, albuterol is used rarely GERD: Well-controlled MSK: Having symptoms of CTS on the L hand, plans to see Ortho. Bradycardia: Has on and off bradycardia, no sxs, we agreed on observation, last EKG was 01-2021 Pneumonia: Dx at the ER 3 months ago, follow-up chest x-ray ordered  Elevated BP: Ambulatory BPs normal per patient RTC 1 year

## 2021-05-19 NOTE — Patient Instructions (Addendum)
Recommend to proceed with the following vaccines at your pharmacy:  Tdap (tetanus)  Check your blood pressure monthly BP GOAL is between 110/65 and  135/85. If it is consistently higher or lower, let me know  GO TO THE LAB : Get the blood work    [place], [place] back for a physical exam in 1 year  STOP BY THE FIRST FLOOR:  get the XR      "Living will", "[place] of attorney": Advanced care planning  (If you already have a living will or healthcare power of attorney, please bring the copy to be scanned in your chart.)  Advance care planning is a process that supports adults in  understanding and sharing their preferences regarding future medical care.   The patient's preferences are recorded in documents called Advance Directives.    Advanced directives are completed (and can be modified at any time) while the patient is in full mental capacity.   The documentation should be available at all times to the patient, the family and the healthcare providers.  Bring in a copy to be scanned in your chart is an excellent idea and is recommended   This legal documents direct treatment decision making and/or appoint a surrogate to make the decision if the patient is not capable to do so.    Advance directives can be documented in many types of formats,  documents have names such as:  Lliving will  Durable power of attorney for healthcare (healthcare proxy or healthcare power of attorney)  Combined directives  Physician orders for life-sustaining treatment    More information at:  [email or web address]

## 2021-05-20 LAB — CBC WITH DIFFERENTIAL/PLATELET
Basophils Absolute: 0.1 10*3/uL (ref 0.0–0.1)
Basophils Relative: 1 % (ref 0.0–3.0)
Eosinophils Absolute: 0.4 10*3/uL (ref 0.0–0.7)
Eosinophils Relative: 6.1 % — ABNORMAL HIGH (ref 0.0–5.0)
HCT: 39 % (ref 39.0–52.0)
Hemoglobin: 13 g/dL (ref 13.0–17.0)
Lymphocytes Relative: 33.2 % (ref 12.0–46.0)
Lymphs Abs: 2.4 10*3/uL (ref 0.7–4.0)
MCHC: 33.2 g/dL (ref 30.0–36.0)
MCV: 97.4 fl (ref 78.0–100.0)
Monocytes Absolute: 0.6 10*3/uL (ref 0.1–1.0)
Monocytes Relative: 8.6 % (ref 3.0–12.0)
Neutro Abs: 3.7 10*3/uL (ref 1.4–7.7)
Neutrophils Relative %: 51.1 % (ref 43.0–77.0)
Platelets: 250 10*3/uL (ref 150.0–400.0)
RBC: 4.01 Mil/uL — ABNORMAL LOW (ref 4.22–5.81)
RDW: 13.6 % (ref 11.5–15.5)
WBC: 7.2 10*3/uL (ref 4.0–10.5)

## 2021-05-20 LAB — HEMOGLOBIN A1C: Hgb A1c MFr Bld: 5.6 % (ref 4.6–6.5)

## 2021-05-20 LAB — COMPREHENSIVE METABOLIC PANEL WITH GFR
ALT: 16 U/L (ref 0–53)
AST: 20 U/L (ref 0–37)
Albumin: 4.4 g/dL (ref 3.5–5.2)
Alkaline Phosphatase: 59 U/L (ref 39–117)
BUN: 20 mg/dL (ref 6–23)
CO2: 28 meq/L (ref 19–32)
Calcium: 9.5 mg/dL (ref 8.4–10.5)
Chloride: 102 meq/L (ref 96–112)
Creatinine, Ser: 1.19 mg/dL (ref 0.40–1.50)
GFR: 62.76 mL/min
Glucose, Bld: 87 mg/dL (ref 70–99)
Potassium: 4.9 meq/L (ref 3.5–5.1)
Sodium: 137 meq/L (ref 135–145)
Total Bilirubin: 0.9 mg/dL (ref 0.2–1.2)
Total Protein: 7.1 g/dL (ref 6.0–8.3)

## 2021-05-20 LAB — LIPID PANEL
Cholesterol: 173 mg/dL (ref 0–200)
HDL: 52.6 mg/dL
LDL Cholesterol: 107 mg/dL — ABNORMAL HIGH (ref 0–99)
NonHDL: 120.02
Total CHOL/HDL Ratio: 3
Triglycerides: 65 mg/dL (ref 0.0–149.0)
VLDL: 13 mg/dL (ref 0.0–40.0)

## 2021-05-20 LAB — PSA: PSA: 1.81 ng/mL (ref 0.10–4.00)

## 2021-05-23 ENCOUNTER — Encounter: Payer: Self-pay | Admitting: Internal Medicine

## 2021-10-15 ENCOUNTER — Other Ambulatory Visit: Payer: Self-pay | Admitting: Internal Medicine

## 2021-10-15 DIAGNOSIS — J310 Chronic rhinitis: Secondary | ICD-10-CM

## 2021-10-27 ENCOUNTER — Ambulatory Visit (INDEPENDENT_AMBULATORY_CARE_PROVIDER_SITE_OTHER): Payer: Medicare HMO

## 2021-10-27 VITALS — Ht 70.0 in | Wt 190.0 lb

## 2021-10-27 DIAGNOSIS — Z0000 Encounter for general adult medical examination without abnormal findings: Secondary | ICD-10-CM | POA: Diagnosis not present

## 2021-10-27 NOTE — Patient Instructions (Signed)
Brett Berry , ?Thank you for taking time to complete your Medicare Wellness Visit. I appreciate your ongoing commitment to your health goals. Please review the following plan we discussed and let me know if I can assist you in the future.  ? ?Screening recommendations/referrals: ?Colonoscopy: Completed 11/27/2019-Due 11/27/2026 ?Recommended yearly ophthalmology/optometry visit for glaucoma screening and checkup ?Recommended yearly dental visit for hygiene and checkup ? ?Vaccinations: ?Influenza vaccine: Up to date ?Pneumococcal vaccine: Up to date ?Tdap vaccine: Up to date ?Shingles vaccine: Completed vaccines   ?Covid-19: Declined ? ?Advanced directives: May obtain forms at our office. ? ?Conditions/risks identified: See problem list ? ?Next appointment: Follow up in one year for your annual wellness visit.  ? ?Preventive Care 102 Years and Older, Male ?Preventive care refers to lifestyle choices and visits with your health care provider that can promote health and wellness. ?What does preventive care include? ?A yearly physical exam. This is also called an annual well check. ?Dental exams once or twice a year. ?Routine eye exams. Ask your health care provider how often you should have your eyes checked. ?Personal lifestyle choices, including: ?Daily care of your teeth and gums. ?Regular physical activity. ?Eating a healthy diet. ?Avoiding tobacco and drug use. ?Limiting alcohol use. ?Practicing safe sex. ?Taking low doses of aspirin every day. ?Taking vitamin and mineral supplements as recommended by your health care provider. ?What happens during an annual well check? ?The services and screenings done by your health care provider during your annual well check will depend on your age, overall health, lifestyle risk factors, and family history of disease. ?Counseling  ?Your health care provider may ask you questions about your: ?Alcohol use. ?Tobacco use. ?Drug use. ?Emotional well-being. ?Home and relationship  well-being. ?Sexual activity. ?Eating habits. ?History of falls. ?Memory and ability to understand (cognition). ?Work and work Statistician. ?Screening  ?You may have the following tests or measurements: ?Height, weight, and BMI. ?Blood pressure. ?Lipid and cholesterol levels. These may be checked every 5 years, or more frequently if you are over 62 years old. ?Skin check. ?Lung cancer screening. You may have this screening every year starting at age 13 if you have a 30-pack-year history of smoking and currently smoke or have quit within the past 15 years. ?Fecal occult blood test (FOBT) of the stool. You may have this test every year starting at age 85. ?Flexible sigmoidoscopy or colonoscopy. You may have a sigmoidoscopy every 5 years or a colonoscopy every 10 years starting at age 69. ?Prostate cancer screening. Recommendations will vary depending on your family history and other risks. ?Hepatitis C blood test. ?Hepatitis B blood test. ?Sexually transmitted disease (STD) testing. ?Diabetes screening. This is done by checking your blood sugar (glucose) after you have not eaten for a while (fasting). You may have this done every 1-3 years. ?Abdominal aortic aneurysm (AAA) screening. You may need this if you are a current or former smoker. ?Osteoporosis. You may be screened starting at age 55 if you are at high risk. ?Talk with your health care provider about your test results, treatment options, and if necessary, the need for more tests. ?Vaccines  ?Your health care provider may recommend certain vaccines, such as: ?Influenza vaccine. This is recommended every year. ?Tetanus, diphtheria, and acellular pertussis (Tdap, Td) vaccine. You may need a Td booster every 10 years. ?Zoster vaccine. You may need this after age 84. ?Pneumococcal 13-valent conjugate (PCV13) vaccine. One dose is recommended after age 31. ?Pneumococcal polysaccharide (PPSV23) vaccine. One dose is recommended  after age 32. ?Talk to your health care  provider about which screenings and vaccines you need and how often you need them. ?This information is not intended to replace advice given to you by your health care provider. Make sure you discuss any questions you have with your health care provider. ?Document Released: 08/16/2015 Document Revised: 04/08/2016 Document Reviewed: 05/21/2015 ?Elsevier Interactive Patient Education ? 2017 [place]. ? ?Fall Prevention in the Home ?Falls can cause injuries. They can happen to people of all ages. There are many things you can do to make your home safe and to help prevent falls. ?What can I do on the outside of my home? ?Regularly fix the edges of walkways and driveways and fix any cracks. ?Remove anything that might make you trip as you walk through a door, such as a raised step or threshold. ?Trim any bushes or trees on the path to your home. ?Use bright outdoor lighting. ?Clear any walking paths of anything that might make someone trip, such as rocks or tools. ?Regularly check to see if handrails are loose or broken. Make sure that both sides of any steps have handrails. ?Any raised decks and porches should have guardrails on the edges. ?Have any leaves, snow, or ice cleared regularly. ?Use sand or salt on walking paths during winter. ?Clean up any spills in your garage right away. This includes oil or grease spills. ?What can I do in the bathroom? ?Use night lights. ?Install grab bars by the toilet and in the tub and shower. Do not use towel bars as grab bars. ?Use non-skid mats or decals in the tub or shower. ?If you need to sit down in the shower, use a plastic, non-slip stool. ?Keep the floor dry. Clean up any water that spills on the floor as soon as it happens. ?Remove soap buildup in the tub or shower regularly. ?Attach bath mats securely with double-sided non-slip rug tape. ?Do not have throw rugs and other things on the floor that can make you trip. ?What can I do in the bedroom? ?Use night lights. ?Make  sure that you have a light by your bed that is easy to reach. ?Do not use any sheets or blankets that are too big for your bed. They should not hang down onto the floor. ?Have a firm chair that has side arms. You can use this for support while you get dressed. ?Do not have throw rugs and other things on the floor that can make you trip. ?What can I do in the kitchen? ?Clean up any spills right away. ?Avoid walking on wet floors. ?Keep items that you use a lot in easy-to-reach places. ?If you need to reach something above you, use a strong step stool that has a grab bar. ?Keep electrical cords out of the way. ?Do not use floor polish or wax that makes floors slippery. If you must use wax, use non-skid floor wax. ?Do not have throw rugs and other things on the floor that can make you trip. ?What can I do with my stairs? ?Do not leave any items on the stairs. ?Make sure that there are handrails on both sides of the stairs and use them. Fix handrails that are broken or loose. Make sure that handrails are as long as the stairways. ?Check any carpeting to make sure that it is firmly attached to the stairs. Fix any carpet that is loose or worn. ?Avoid having throw rugs at the top or bottom of the stairs. If you  do have throw rugs, attach them to the floor with carpet tape. ?Make sure that you have a light switch at the top of the stairs and the bottom of the stairs. If you do not have them, ask someone to add them for you. ?What else can I do to help prevent falls? ?Wear shoes that: ?Do not have high heels. ?Have rubber bottoms. ?Are comfortable and fit you well. ?Are closed at the toe. Do not wear sandals. ?If you use a stepladder: ?Make sure that it is fully opened. Do not climb a closed stepladder. ?Make sure that both sides of the stepladder are locked into place. ?Ask someone to hold it for you, if possible. ?Clearly mark and make sure that you can see: ?Any grab bars or handrails. ?First and last steps. ?Where the  edge of each step is. ?Use tools that help you move around (mobility aids) if they are needed. These include: ?Canes. ?Walkers. ?Scooters. ?Crutches. ?Turn on the lights when you go into a dark area. Replace an

## 2021-10-27 NOTE — Progress Notes (Addendum)
? ?Subjective:  ? Brett Berry is a 71 y.o. male who presents for Medicare Annual/Subsequent preventive examination. ? ?I connected with Dagan today by telephone and verified that I am speaking with the correct person using two identifiers. ?Location patient: home ?Location provider: work ?Persons participating in the virtual visit: patient, nurse.  ?  ?I discussed the limitations, risks, security and privacy concerns of performing an evaluation and management service by telephone and the availability of in person appointments. I also discussed with the patient that there may be a patient responsible charge related to this service. The patient expressed understanding and verbally consented to this telephonic visit.  ?  ?Interactive audio and video telecommunications were attempted between this provider and patient, however failed, due to patient having technical difficulties OR patient did not have access to video capability.  We continued and completed visit with audio only. ? ?Some vital signs may be absent or patient reported.  ? ?Time Spent with patient on telephone encounter: 20 minutes ? ? ?Review of Systems    ? ?Cardiac Risk Factors include: advanced age (>49mn, >>72women);male gender;dyslipidemia ? ?   ?Objective:  ?  ?Today's Vitals  ? 10/27/21 1332  ?Weight: 190 lb (86.2 kg)  ?Height: '5\' 10"'$  (1.778 m)  ? ?Body mass index is 27.26 kg/m?. ? ? ?  10/27/2021  ?  1:35 PM 02/15/2021  ? 11:58 AM 10/17/2019  ?  8:12 AM 09/29/2018  ? 11:08 AM 08/31/2018  ? 11:12 AM 08/24/2018  ?  3:21 PM 07/24/2018  ? 11:07 AM  ?Advanced Directives  ?Does Patient Have a Medical Advance Directive? No No No No No No No  ?Would patient like information on creating a medical advance directive?  No - Patient declined No - Patient declined No - Patient declined No - Patient declined No - Patient declined No - Patient declined  ? ? ?Current Medications (verified) ?Outpatient Encounter Medications as of 10/27/2021  ?Medication Sig  ?  albuterol (VENTOLIN HFA) 108 (90 Base) MCG/ACT inhaler Inhale 2 puffs into the lungs every 4 (four) hours as needed for wheezing or shortness of breath.  ? atorvastatin (LIPITOR) 10 MG tablet TAKE 1 TABLET BY MOUTH EVERYDAY AT BEDTIME  ? budesonide-formoterol (SYMBICORT) 160-4.5 MCG/ACT inhaler INHALE 2 PUFFS INTO THE LUNGS TWICE A DAY  ? celecoxib (CELEBREX) 100 MG capsule Take 1 capsule (100 mg total) by mouth 2 (two) times daily as needed for moderate pain.  ? Coenzyme Q10 (CO Q 10 PO) Take 1 capsule by mouth daily.   ? esomeprazole (NEXIUM) 40 MG capsule Take 1 capsule (40 mg total) by mouth 2 (two) times daily before a meal.  ? fluticasone (FLONASE) 50 MCG/ACT nasal spray Place 2 sprays into both nostrils daily for 14 days.  ? levalbuterol (XOPENEX HFA) 45 MCG/ACT inhaler Inhale 1-2 puffs into the lungs every 4 (four) hours as needed for wheezing or shortness of breath.  ? magnesium oxide (MAG-OX) 400 MG tablet Take 400 mg by mouth daily.  ? Multiple Vitamin (MULTIVITAMIN WITH MINERALS) TABS tablet Take 1 tablet by mouth daily.  ? Omega-3 Fatty Acids (FISH OIL) 1000 MG CAPS Take 1,000 mg by mouth 2 (two) times daily.   ? ?No facility-administered encounter medications on file as of 10/27/2021.  ? ? ?Allergies (verified) ?Patient has no known allergies.  ? ?History: ?Past Medical History:  ?Diagnosis Date  ? Allergic rhinitis   ? Asthma   ? Bilateral knee pain 2016  ? x-ray showed  medial compartment arthritis, worse on right than left  ? Erectile dysfunction 07/30/2014  ? GERD (gastroesophageal reflux disease)   ? Hearing difficulty   ? has aids  ? History of kidney stones   ? Hyperlipidemia   ? intolerant to zocor  ? Pneumonia   ? Sinusitis, chronic   ? ?Past Surgical History:  ?Procedure Laterality Date  ? CARPAL TUNNEL RELEASE Right 06/2018  ? CATARACT EXTRACTION Right 2016  ? COLONOSCOPY  09/25/2011  ? MASTOIDECTOMY    ? right   ? POLYPECTOMY    ? right hand surg    ? for infection in '07  ? SINUS ENDO WITH  FUSION    ? TOE SURGERY    ? Left Great toe  ? TOTAL KNEE ARTHROPLASTY Bilateral 08/30/2018  ? TOTAL KNEE ARTHROPLASTY Bilateral 08/30/2018  ? Procedure: BILATERAL TOTAL KNEE ARTHROPLASTY;  Surgeon: Meredith Pel, MD;  Location: [place];  Service: Orthopedics;  Laterality: Bilateral;  ? ?Family History  ?Problem Relation Age of Onset  ? COPD Mother   ? Breast cancer Mother   ? Colon polyps Mother   ? Alzheimer's disease Father   ? Lung cancer Father   ?     smoker  ? Prostate cancer Father 45  ?     died age 14  ? Heart disease Father   ?     age 88  ? Colon cancer Neg Hx   ? Esophageal cancer Neg Hx   ? Rectal cancer Neg Hx   ? Stomach cancer Neg Hx   ? ?Social History  ? ?Socioeconomic History  ? Marital status: Widowed  ?  Spouse name: Not on file  ? Number of children: 1  ? Years of education: Not on file  ? Highest education level: Not on file  ?Occupational History  ? Occupation: Visual merchandiser  ?  Employer: [employer]  ?Tobacco Use  ? Smoking status: Former  ?  Types: Cigarettes  ?  Quit date: 09/08/1973  ?  Years since quitting: 48.1  ? Smokeless tobacco: Never  ? Tobacco comments:  ?  Quit I the 1980s  ?Vaping Use  ? Vaping Use: Never used  ?Substance and Sexual Activity  ? Alcohol use: Yes  ?  Alcohol/week: 3.0 standard drinks  ?  Types: 3 Cans of beer per week  ? Drug use: No  ? Sexual activity: Yes  ?  Partners: Female  ?Other Topics Concern  ? Not on file  ?Social History Narrative  ? [employer]. Married '76- 6 years divorced ; '85. 1 son- '86.   ? Lost wife 12-2014  ? ?Social Determinants of Health  ? ?Financial Resource Strain: Low Risk   ? Difficulty of Paying Living Expenses: Not hard at all  ?Food Insecurity: No Food Insecurity  ? Worried About Charity fundraiser in the Last Year: Never true  ? Ran Out of Food in the Last Year: Never true  ?Transportation Needs: No Transportation Needs  ? Lack of Transportation (Medical): No  ? Lack of Transportation (Non-Medical): No  ?Physical  Activity: Sufficiently Active  ? Days of Exercise per Week: 7 days  ? Minutes of Exercise per Session: 50 min  ?Stress: No Stress Concern Present  ? Feeling of Stress : Not at all  ?Social Connections: Moderately Isolated  ? Frequency of Communication with Friends and Family: More than three times a week  ? Frequency of Social Gatherings with Friends and Family: More  than three times a week  ? Attends Religious Services: More than 4 times per year  ? Active Member of Clubs or Organizations: No  ? Attends Archivist Meetings: Never  ? Marital Status: Widowed  ? ? ?Tobacco Counseling ?Counseling given: Not Answered ?Tobacco comments: Quit I the 1980s ? ? ?Clinical Intake: ? ?Pre-visit preparation completed: Yes ? ?Pain : No/denies pain ? ?  ? ?BMI - recorded: 27.26 ?Nutritional Status: BMI 25 -29 Overweight ?Nutritional Risks: None ?Diabetes: No ? ?How often do you need to have someone help you when you read instructions, pamphlets, or other written materials from your doctor or pharmacy?: 1 - Never ? ?Diabetic?No ? ?Interpreter Needed?: No ? ?Information entered by :: Caroleen Hamman LPN ? ? ?Activities of Daily Living ? ?  10/27/2021  ?  1:37 PM 05/19/2021  ?  1:13 PM  ?In your present state of health, do you have any difficulty performing the following activities:  ?Hearing? 0 0  ?Vision? 0 0  ?Difficulty concentrating or making decisions? 0 0  ?Walking or climbing stairs? 0 0  ?Dressing or bathing? 0 0  ?Doing errands, shopping? 0 0  ?Preparing Food and eating ? N   ?Using the Toilet? N   ?In the past six months, have you accidently leaked urine? N   ?Do you have problems with loss of bowel control? N   ?Managing your Medications? N   ?Managing your Finances? N   ?Housekeeping or managing your Housekeeping? N   ? ? ?Patient Care Team: ?Colon Branch, MD as PCP - General (Internal Medicine) ?Rozetta Nunnery, MD (Inactive) ?Harold Hedge, Darrick Grinder, MD (Allergy and Immunology) ?Meredith Pel, MD as  Consulting Physician (Orthopedic Surgery) ? ?Indicate any recent Medical Services you may have received from other than [facility] providers in the past year (date may be approximate). ? ?   ?Assessment:  ? This is a rout

## 2021-12-25 ENCOUNTER — Other Ambulatory Visit: Payer: Self-pay | Admitting: Internal Medicine

## 2022-01-27 ENCOUNTER — Emergency Department (HOSPITAL_BASED_OUTPATIENT_CLINIC_OR_DEPARTMENT_OTHER)
Admission: EM | Admit: 2022-01-27 | Discharge: 2022-01-27 | Disposition: A | Discharge status: Home / Self Care | Payer: Medicare HMO | Attending: Emergency Medicine | Admitting: Emergency Medicine

## 2022-01-27 ENCOUNTER — Encounter (HOSPITAL_BASED_OUTPATIENT_CLINIC_OR_DEPARTMENT_OTHER): Payer: Self-pay | Admitting: Urology

## 2022-01-27 ENCOUNTER — Other Ambulatory Visit: Payer: Self-pay

## 2022-01-27 DIAGNOSIS — L02422 Furuncle of left axilla: Secondary | ICD-10-CM | POA: Diagnosis not present

## 2022-01-27 DIAGNOSIS — L739 Follicular disorder, unspecified: Secondary | ICD-10-CM

## 2022-01-27 DIAGNOSIS — L02414 Cutaneous abscess of left upper limb: Secondary | ICD-10-CM | POA: Diagnosis present

## 2022-01-27 MED ORDER — CEPHALEXIN 500 MG PO CAPS: 500.0000 mg | ORAL_CAPSULE | Freq: Two times a day (BID) | ORAL | 0 refills | Status: AC

## 2022-01-27 MED ORDER — LIDOCAINE HCL (PF) 1 % IJ SOLN
5.0000 mL | Freq: Once | INTRAMUSCULAR | Status: AC
  Administered 2022-01-27: 5 mL via INTRADERMAL
  Filled 2022-01-27: qty 5

## 2022-01-27 NOTE — ED Provider Notes (Signed)
[facility] EMERGENCY DEPARTMENT Provider Note   CSN: [id number] Arrival date & time: 01/27/22  1648     History  Chief Complaint  Patient presents with   Abscess    Brett Berry is a 71 y.o. male presenting to ED with concern for a boil under his left armpit, which began about a week ago.  He denies shaving his armpit.  Denies history of MRSA.  Denies fevers or chills.  Is a single spot under left armpit.  It began draining a small amount today  HPI     Home Medications Prior to Admission medications   Medication Sig Start Date End Date Taking? Authorizing Provider  cephALEXin (KEFLEX) 500 MG capsule Take 1 capsule (500 mg total) by mouth 2 (two) times daily for 7 days. 01/27/22 02/03/22 Yes Ivor Kishi, Kermit Balo, MD  albuterol (VENTOLIN HFA) 108 (90 Base) MCG/ACT inhaler Inhale 2 puffs into the lungs every 4 (four) hours as needed for wheezing or shortness of breath. 02/12/21   Ivette Loyal, NP  atorvastatin (LIPITOR) 10 MG tablet TAKE 1 TABLET BY MOUTH EVERYDAY AT BEDTIME 12/25/21   Wanda Plump, MD  budesonide-formoterol [facility]) 160-4.5 MCG/ACT inhaler INHALE 2 PUFFS INTO THE LUNGS TWICE A DAY 10/15/21   Wanda Plump, MD  celecoxib (CELEBREX) 100 MG capsule Take 1 capsule (100 mg total) by mouth 2 (two) times daily as needed for moderate pain. 11/04/20   Wanda Plump, MD  Coenzyme Q10 (CO Q 10 PO) Take 1 capsule by mouth daily.     [provider]  esomeprazole (NEXIUM) 40 MG capsule Take 1 capsule (40 mg total) by mouth 2 (two) times daily before a meal. 08/05/20   Paz, Nolon Rod, MD  fluticasone [facility]) 50 MCG/ACT nasal spray Place 2 sprays into both nostrils daily for 14 days. 02/15/21   Gailen Shelter, PA  levalbuterol (XOPENEX HFA) 45 MCG/ACT inhaler Inhale 1-2 puffs into the lungs every 4 (four) hours as needed for wheezing or shortness of breath. 05/25/19   Wanda Plump, MD  magnesium oxide (MAG-OX) 400 MG tablet Take 400 mg by mouth daily.    [provider]  Multiple Vitamin (MULTIVITAMIN WITH MINERALS) TABS tablet Take 1 tablet by mouth daily.    [provider]  Omega-3 Fatty Acids (FISH OIL) 1000 MG CAPS Take 1,000 mg by mouth 2 (two) times daily.     [provider]      Allergies    Patient has no known allergies.    Review of Systems   Review of Systems  Physical Exam Updated Vital Signs BP (!) 158/72 (BP Location: Right Arm)   Pulse (!) 51   Temp 97.8 F (36.6 C) (Oral)   Resp 18   Ht 5\' 10"  (1.778 m)   Wt 88.5 kg   SpO2 98%   BMI 27.98 kg/m  Physical Exam Constitutional:      General: He is not in acute distress. HENT:     Head: Normocephalic and atraumatic.  Eyes:     Conjunctiva/sclera: Conjunctivae normal.     Pupils: Pupils are equal, round, and reactive to light.  Cardiovascular:     Rate and Rhythm: Normal rate and regular rhythm.  Pulmonary:     Effort: Pulmonary effort is normal. No respiratory distress.  Skin:    General: Skin is warm and dry.     Comments: Small superficial 1 cm fluctuant pocket palpable in the left axilla, small  amount of purulent drainage.  Reactive lymphadenopathy noted around this.  Neurological:     General: No focal deficit present.     Mental Status: He is alert. Mental status is at baseline.  Psychiatric:        Mood and Affect: Mood normal.        Behavior: Behavior normal.     ED Results / Procedures / Treatments   Labs (all labs ordered are listed, but only abnormal results are displayed) Labs Reviewed - No data to display  EKG None  Radiology No results found.  Procedures .Marland KitchenIncision and Drainage  Date/Time: 01/27/2022 6:47 PM  Performed by: Terald Sleeper, MD Authorized by: Terald Sleeper, MD   Consent:    Consent obtained:  Verbal   Consent given by:  Patient   Risks, benefits, and alternatives were discussed: yes     Risks discussed:  Bleeding, incomplete drainage, infection and pain   Alternatives discussed:  No  treatment and observation Universal protocol:    Procedure explained and questions answered to patient or proxy's satisfaction: yes     Immediately prior to procedure, a time out was called: yes     Patient identity confirmed:  Arm band Location:    Type:  Abscess   Size:  1 cm   Location:  Upper extremity   Upper extremity location:  Shoulder   Shoulder location:  L shoulder (axilla) Pre-procedure details:    Skin preparation:  Povidone-iodine Sedation:    Sedation type:  None Anesthesia:    Anesthesia method:  Local infiltration   Local anesthetic:  Lidocaine 1% w/o epi Procedure type:    Complexity:  Simple Procedure details:    Ultrasound guidance: yes     Needle aspiration: no     Incision types:  Stab incision   Drainage:  Bloody   Drainage amount:  Scant   Wound treatment:  Wound left open   Packing materials:  None Post-procedure details:    Procedure completion:  Tolerated well, no immediate complications Comments:     Small amount of spontaneous purulent drainage from abscess site prior to I&D - scant purulence on incision and probing/deloculation, moderate bleeding, controlled with pressure     Medications Ordered in ED Medications  lidocaine (PF) (XYLOCAINE) 1 % injection 5 mL (5 mLs Intradermal Given 01/27/22 1818)    ED Course/ Medical Decision Making/ A&P                           Medical Decision Making Risk Prescription drug management.   Patient is here with a small left abscess versus folliculitis.  Bedside ultrasound shows a 1 x 1 cm pocket directly under the skin.  This does not appear to be a lymph node per ultrasound review.  He does have some reactive lymphadenopathy surrounding this.  Patient was consented for irrigation and drainage, a small linear incision was made to allow drainage of the abscess site.  I do not see evidence of overlying infection at this time.  I do not see clinical indication for antibiotics.  Advised cleaning with soap  and water at home.  The wound was too small for packing.  He verbalized understanding and agreement.        Final Clinical Impression(s) / ED Diagnoses Final diagnoses:  Folliculitis    Rx / DC Orders ED Discharge Orders          Ordered    cephALEXin (KEFLEX)  500 MG capsule  2 times daily        01/27/22 1753              Terald Sleeper, MD 01/27/22 [phone]

## 2022-05-04 ENCOUNTER — Emergency Department (HOSPITAL_BASED_OUTPATIENT_CLINIC_OR_DEPARTMENT_OTHER)
Admission: EM | Admit: 2022-05-04 | Discharge: 2022-05-04 | Disposition: A | Discharge status: Home / Self Care | Payer: Medicare HMO | Attending: Emergency Medicine | Admitting: Emergency Medicine

## 2022-05-04 ENCOUNTER — Other Ambulatory Visit: Payer: Self-pay

## 2022-05-04 ENCOUNTER — Encounter (HOSPITAL_BASED_OUTPATIENT_CLINIC_OR_DEPARTMENT_OTHER): Payer: Self-pay | Admitting: Pediatrics

## 2022-05-04 DIAGNOSIS — Z79899 Other long term (current) drug therapy: Secondary | ICD-10-CM | POA: Insufficient documentation

## 2022-05-04 DIAGNOSIS — M5459 Other low back pain: Secondary | ICD-10-CM | POA: Diagnosis not present

## 2022-05-04 DIAGNOSIS — M545 Low back pain, unspecified: Secondary | ICD-10-CM | POA: Diagnosis not present

## 2022-05-04 MED ORDER — CYCLOBENZAPRINE HCL 10 MG PO TABS: 10.0000 mg | ORAL_TABLET | Freq: Two times a day (BID) | ORAL | 0 refills | Status: AC | PRN

## 2022-05-04 NOTE — ED Provider Notes (Signed)
[place] EMERGENCY DEPARTMENT Provider Note   CSN: [id number] Arrival date & time: 05/04/22  1435     History  Chief Complaint  Patient presents with   Back Pain    Brett Berry is a 71 y.o. male.  Who presents ED for evaluation of right-sided low back pain.  States problem has been ongoing for the past 4 to 5 months.  States pain worsens with strenuous activity.  He states he was mowing his lawn yesterday and then sat down on the couch for couple hours.  Stated when he stood up the pain was severe and radiated down the right leg.  Describes pain as a dull ache and rates it at a 5 out of 10 currently.  Has tried Aleve at home sporadically moderate relief.  Has also tried lidocaine patches with moderate relief.  Denies urinary or fecal incontinence, saddle paresthesias, fevers, IV drug use, numbness, weakness, tingling.   Back Pain      Home Medications Prior to Admission medications   Medication Sig Start Date End Date Taking? Authorizing Provider  cyclobenzaprine (FLEXERIL) 10 MG tablet Take 1 tablet (10 mg total) by mouth 2 (two) times daily as needed for up to 5 days for muscle spasms. 05/04/22 05/09/22 Yes Nikan Ellingson, Grafton Folk, PA-C  albuterol (VENTOLIN HFA) 108 (90 Base) MCG/ACT inhaler Inhale 2 puffs into the lungs every 4 (four) hours as needed for wheezing or shortness of breath. 02/12/21   [name], NP  atorvastatin (LIPITOR) 10 MG tablet TAKE 1 TABLET BY MOUTH EVERYDAY AT BEDTIME 12/25/21   Colon Branch, MD  budesonide-formoterol [facility]) 160-4.5 MCG/ACT inhaler INHALE 2 PUFFS INTO THE LUNGS TWICE A DAY 10/15/21   Colon Branch, MD  celecoxib (CELEBREX) 100 MG capsule Take 1 capsule (100 mg total) by mouth 2 (two) times daily as needed for moderate pain. 11/04/20   Colon Branch, MD  Coenzyme Q10 (CO Q 10 PO) Take 1 capsule by mouth daily.     [provider]  esomeprazole (NEXIUM) 40 MG capsule Take 1 capsule (40 mg total) by mouth 2 (two) times daily  before a meal. 08/05/20   Paz, [name], MD  fluticasone [facility]) 50 MCG/ACT nasal spray Place 2 sprays into both nostrils daily for 14 days. 02/15/21   Tedd Sias, PA  levalbuterol (XOPENEX HFA) 45 MCG/ACT inhaler Inhale 1-2 puffs into the lungs every 4 (four) hours as needed for wheezing or shortness of breath. 05/25/19   Colon Branch, MD  magnesium oxide (MAG-OX) 400 MG tablet Take 400 mg by mouth daily.    [provider]  Multiple Vitamin (MULTIVITAMIN WITH MINERALS) TABS tablet Take 1 tablet by mouth daily.    [provider]  Omega-3 Fatty Acids (FISH OIL) 1000 MG CAPS Take 1,000 mg by mouth 2 (two) times daily.     [provider]      Allergies    Patient has no known allergies.    Review of Systems   Review of Systems  Musculoskeletal:  Positive for back pain.  All other systems reviewed and are negative.   Physical Exam Updated Vital Signs BP (!) 176/79 (BP Location: Left Arm)   Pulse (!) 48   Temp 98.2 F (36.8 C) (Oral)   Resp 18   Ht '5\' 11"'$  (1.803 m)   Wt 88.5 kg   SpO2 97%   BMI 27.20 kg/m  Physical Exam Vitals and nursing note reviewed.  Constitutional:  General: He is not in acute distress.    Appearance: Normal appearance. He is normal weight. He is not ill-appearing.  HENT:     Head: Normocephalic and atraumatic.  Pulmonary:     Effort: Pulmonary effort is normal. No respiratory distress.  Abdominal:     General: Abdomen is flat.  Musculoskeletal:        General: Normal range of motion.     Cervical back: Normal and neck supple.     Thoracic back: Normal.     Lumbar back: No swelling, signs of trauma, tenderness or bony tenderness. Normal range of motion. Negative right straight leg raise test and negative left straight leg raise test.     Comments: Patient was observed ambulating without difficulty  Skin:    General: Skin is warm and dry.  Neurological:     Mental Status: He is alert and oriented to person, place,  and time.  Psychiatric:        Mood and Affect: Mood normal.        Behavior: Behavior normal.     ED Results / Procedures / Treatments   Labs (all labs ordered are listed, but only abnormal results are displayed) Labs Reviewed - No data to display  EKG None  Radiology No results found.  Procedures Procedures    Medications Ordered in ED Medications - No data to display  ED Course/ Medical Decision Making/ A&P                           Medical Decision Making Risk Prescription drug management.  This patient presents to the ED for concern of back pain, this involves an extensive number of treatment options, and is a complaint that carries with it a high risk of complications and morbidity.   The emergent differential diagnosis for back pain includes but is not limited to fracture, muscle strain, cauda equina, spinal stenosis. DDD, ankylosing spondylitis, acute ligamentous injury, disk herniation, spondylolisthesis, Epidural compression syndrome, metastatic cancer, transverse myelitis, vertebral osteomyelitis, diskitis, kidney stone, pyelonephritis, AAA, Perforated ulcer, Retrocecal appendicitis, pancreatitis, bowel obstruction, retroperitoneal hemorrhage or mass, meningitis.    Additional history obtained from: Nursing notes from this visit.   Afebrile, hemodynamically stable.  Patient is a 71 year old male who presents ED for evaluation of right-sided low back pain.  States this has been an ongoing issue over the past 4 to 5 months.  Worse after strenuous activity.  Reproducible with forward flexion.  Denies numbness, weakness, tingling, saddle paresthesias, fecal or urinary incontinence, fevers, IV drug use.  Physical exam largely unremarkable and straight leg raise negative.  I have low suspicion for cord compression or other emergent back pain diagnoses.  We will send patient a prescription for Flexeril and instructed him to only take it as needed, avoid driving, and to take  it at night.  Instructed patient to continue with lidocaine patches.  Informed patient on appropriate dosing of Tylenol and ibuprofen.  Patient has a physical coming up with his primary care provider and states he will bring it up at that time.  Patient reports he does not like to take pills and was hesitant to accept Flexeril.  Gave strict return precautions.  Stable at discharge.  At this time there does not appear to be any evidence of an acute emergency medical condition and the patient appears stable for discharge with appropriate outpatient follow up. Diagnosis was discussed with patient who verbalizes understanding of care plan  and is agreeable to discharge. I have discussed return precautions with patient who verbalizes understanding. Patient encouraged to follow-up with their PCP within 1 week. All questions answered.  Patient's case discussed with Dr. Sherry Ruffing who agrees with plan to discharge with follow-up.   Note: Portions of this report may have been transcribed using voice recognition software. Every effort was made to ensure accuracy; however, inadvertent computerized transcription errors may still be present.          Final Clinical Impression(s) / ED Diagnoses Final diagnoses:  Acute right-sided low back pain without sciatica    Rx / DC Orders ED Discharge Orders          Ordered    cyclobenzaprine (FLEXERIL) 10 MG tablet  2 times daily PRN        05/04/22 1649              Roylene Reason, PA-C 05/04/22 1703    Tegeler, Gwenyth Allegra, MD 05/05/22 0000

## 2022-05-04 NOTE — Discharge Instructions (Signed)
You have been seen today for your complaint of right-sided low back pain. Your discharge medications include Flexeril.  This is a muscle ask.  You should take it as needed for pain.  You should start by taking it at night because it may cause drowsiness.  You should not drive or operate heavy machinery while taking this medication. Home care instructions are as follows:  You may alternate Tylenol and ibuprofen every 4 hours for pain at home.  You may take up to 800 mg of ibuprofen up to 1000 mg of Tylenol Follow up with: Your primary care provider next week Please seek immediate medical care if you develop any of the following symptoms: You have weakness or numbness in one or both of your legs or feet. You have trouble controlling your bladder or your bowels. You have severe back pain and have any of the following: Nausea or vomiting. Pain in your abdomen. Shortness of breath or you faint. At this time there does not appear to be the presence of an emergent medical condition, however there is always the potential for conditions to change. Please read and follow the below instructions.  Do not take your medicine if  develop an itchy rash, swelling in your mouth or lips, or difficulty breathing; call 911 and seek immediate emergency medical attention if this occurs.  You may review your lab tests and imaging results in their entirety on your MyChart account.  Please discuss all results of fully with your primary care provider and other specialist at your follow-up visit.  Note: Portions of this text may have been transcribed using voice recognition software. Every effort was made to ensure accuracy; however, inadvertent computerized transcription errors may still be present.

## 2022-05-04 NOTE — ED Triage Notes (Signed)
C/O back pain radiates down right leg pain opn and off for 5-6 months.

## 2022-05-14 ENCOUNTER — Encounter (HOSPITAL_COMMUNITY): Payer: Self-pay | Admitting: Emergency Medicine

## 2022-05-14 ENCOUNTER — Ambulatory Visit (INDEPENDENT_AMBULATORY_CARE_PROVIDER_SITE_OTHER): Payer: Medicare HMO

## 2022-05-14 ENCOUNTER — Ambulatory Visit (HOSPITAL_COMMUNITY)
Admission: EM | Admit: 2022-05-14 | Discharge: 2022-05-14 | Disposition: A | Discharge status: Home / Self Care | Payer: Medicare HMO | Attending: Emergency Medicine | Admitting: Emergency Medicine

## 2022-05-14 ENCOUNTER — Other Ambulatory Visit: Payer: Self-pay

## 2022-05-14 DIAGNOSIS — R059 Cough, unspecified: Secondary | ICD-10-CM | POA: Insufficient documentation

## 2022-05-14 DIAGNOSIS — J069 Acute upper respiratory infection, unspecified: Secondary | ICD-10-CM | POA: Insufficient documentation

## 2022-05-14 DIAGNOSIS — Z79899 Other long term (current) drug therapy: Secondary | ICD-10-CM | POA: Diagnosis not present

## 2022-05-14 DIAGNOSIS — R0989 Other specified symptoms and signs involving the circulatory and respiratory systems: Secondary | ICD-10-CM | POA: Diagnosis not present

## 2022-05-14 DIAGNOSIS — Z1152 Encounter for screening for COVID-19: Secondary | ICD-10-CM | POA: Diagnosis not present

## 2022-05-14 DIAGNOSIS — R519 Headache, unspecified: Secondary | ICD-10-CM | POA: Diagnosis not present

## 2022-05-14 LAB — SARS CORONAVIRUS 2 (TAT 6-24 HRS): SARS Coronavirus 2: NEGATIVE

## 2022-05-14 MED ORDER — ALBUTEROL SULFATE HFA 108 (90 BASE) MCG/ACT IN AERS: 2.0000 | INHALATION_SPRAY | RESPIRATORY_TRACT | 2 refills | Status: DC | PRN

## 2022-05-14 MED ORDER — BENZONATATE 100 MG PO CAPS: 100.0000 mg | ORAL_CAPSULE | Freq: Three times a day (TID) | ORAL | 0 refills | Status: AC | PRN

## 2022-05-14 NOTE — ED Provider Notes (Signed)
[facility]    CSN: [id number] Arrival date & time: 05/14/22  0254      History   Chief Complaint Chief Complaint  Patient presents with   URI    HPI Brett Berry is a 71 y.o. male.  Presents with 4 day history of multiple symptoms Reports headache, cough, chest congestion Cough is productive of yellow/green mucous Chest feels sore when taking deep breath Low grade temp 100  Has tried theraflu and mucinex  History of chronic sinusitis, asthma Ran out of albuterol inhaler yesterday   Last year similar symptoms and had pneumonia  Past Medical History:  Diagnosis Date   Allergic rhinitis    Asthma    Bilateral knee pain 2016   x-ray showed medial compartment arthritis, worse on right than left   Erectile dysfunction 07/30/2014   GERD (gastroesophageal reflux disease)    Hearing difficulty    has aids   History of kidney stones    Hyperlipidemia    intolerant to zocor   Pneumonia    Sinusitis, chronic     Patient Active Problem List   Diagnosis Date Noted   Unilateral primary osteoarthritis, left knee    Unilateral primary osteoarthritis, right knee    Arthritis of knee 08/30/2018   GERD with esophagitis 08/19/2016   PCP NOTES >>>>>>>>>>>>>>>>>> 02/18/2016   DJD (knees, celebrex, ortho Dr Sharol Given) 01/08/2015   Erectile dysfunction 07/30/2014   Annual physical exam 12/29/2013   Non-functioning tympanostomy tube 09/10/2011   Dysphagia 09/10/2011   Chronic sinusitis 02/05/2010   HEADACHE 02/05/2010   Anxiety -insomnia 01/04/2009   Hyperlipidemia 01/04/2009   ALLERGIC RHINITIS 10/27/2007   Asthma 10/27/2007    Past Surgical History:  Procedure Laterality Date   CARPAL TUNNEL RELEASE Right 06/2018   CATARACT EXTRACTION Right 2016   COLONOSCOPY  09/25/2011   MASTOIDECTOMY     right    POLYPECTOMY     right hand surg     for infection in [place]     TOE SURGERY     Left Great toe   TOTAL KNEE ARTHROPLASTY Bilateral  08/30/2018   TOTAL KNEE ARTHROPLASTY Bilateral 08/30/2018   Procedure: BILATERAL TOTAL KNEE ARTHROPLASTY;  Surgeon: Meredith Pel, MD;  Location: [facility];  Service: Orthopedics;  Laterality: Bilateral;       Home Medications    Prior to Admission medications   Medication Sig Start Date End Date Taking? Authorizing Provider  albuterol (VENTOLIN HFA) 108 (90 Base) MCG/ACT inhaler Inhale 2 puffs into the lungs every 4 (four) hours as needed for wheezing or shortness of breath. 05/14/22   Kieon Lawhorn, Wells Guiles, PA-C  atorvastatin (LIPITOR) 10 MG tablet TAKE 1 TABLET BY MOUTH EVERYDAY AT BEDTIME 12/25/21   Paz, [name], MD  benzonatate (TESSALON) 100 MG capsule Take 1 capsule (100 mg total) by mouth 3 (three) times daily as needed for up to 4 days for cough. 05/14/22 05/18/22 Yes Latarra Eagleton, Wells Guiles, PA-C  budesonide-formoterol (SYMBICORT) 160-4.5 MCG/ACT inhaler INHALE 2 PUFFS INTO THE LUNGS TWICE A DAY 10/15/21   Colon Branch, MD  celecoxib (CELEBREX) 100 MG capsule Take 1 capsule (100 mg total) by mouth 2 (two) times daily as needed for moderate pain. 11/04/20   Colon Branch, MD  Coenzyme Q10 (CO Q 10 PO) Take 1 capsule by mouth daily.     [provider]  esomeprazole (NEXIUM) 40 MG capsule Take 1 capsule (40 mg total) by mouth 2 (two)  times daily before a meal. 08/05/20   Paz, [name], MD  fluticasone [facility]) 50 MCG/ACT nasal spray Place 2 sprays into both nostrils daily for 14 days. 02/15/21   Tedd Sias, PA  magnesium oxide (MAG-OX) 400 MG tablet Take 400 mg by mouth daily.    [provider]  Multiple Vitamin (MULTIVITAMIN WITH MINERALS) TABS tablet Take 1 tablet by mouth daily.    [provider]  Omega-3 Fatty Acids (FISH OIL) 1000 MG CAPS Take 1,000 mg by mouth 2 (two) times daily.     [provider]    Family History Family History  Problem Relation Age of Onset   COPD Mother    Breast cancer Mother    Colon polyps Mother    Alzheimer's disease Father     Lung cancer Father        smoker   Prostate cancer Father 72       died age 49   Heart disease Father        age 25   Colon cancer Neg Hx    Esophageal cancer Neg Hx    Rectal cancer Neg Hx    Stomach cancer Neg Hx     Social History Social History   Tobacco Use   Smoking status: Former    Types: Cigarettes    Quit date: 09/08/1973    Years since quitting: 48.7   Smokeless tobacco: Never   Tobacco comments:    Quit I the 1980s  Vaping Use   Vaping Use: Never used  Substance Use Topics   Alcohol use: Yes    Alcohol/week: 3.0 standard drinks of alcohol    Types: 3 Cans of beer per week   Drug use: No     Allergies   Patient has no known allergies.   Review of Systems Review of Systems  Per HPI  Physical Exam Triage Vital Signs ED Triage Vitals  Enc Vitals Group     BP      Pulse      Resp      Temp      Temp src      SpO2      Weight      Height      Head Circumference      Peak Flow      Pain Score      Pain Loc      Pain Edu?      Excl. in [place]?    No data found.  Updated Vital Signs BP (!) 140/80 (BP Location: Right Arm)   Pulse 63   Temp 98.6 F (37 C) (Oral)   Resp 18   SpO2 99%    Physical Exam Vitals and nursing note reviewed.  Constitutional:      General: He is not in acute distress.    Appearance: Normal appearance.  HENT:     Mouth/Throat:     Mouth: Mucous membranes are moist.     Pharynx: Oropharynx is clear.  Eyes:     Conjunctiva/sclera: Conjunctivae normal.  Cardiovascular:     Rate and Rhythm: Normal rate and regular rhythm.     Pulses: Normal pulses.     Heart sounds: Normal heart sounds.  Pulmonary:     Effort: Pulmonary effort is normal.     Breath sounds: No decreased air movement. Decreased breath sounds present. No wheezing.     Comments: Coarse, faint sounds in lower lobes Chest:     Chest  wall: No tenderness.  Skin:    General: Skin is warm and dry.  Neurological:     Mental Status: He is alert and  oriented to person, place, and time.     UC Treatments / Results  Labs (all labs ordered are listed, but only abnormal results are displayed) Labs Reviewed  SARS CORONAVIRUS 2 (TAT 6-24 HRS)    EKG  Radiology DG Chest 2 View  Result Date: 05/14/2022 CLINICAL DATA:  71 year old male with cough and chest congestion. Headache. Former smoker. EXAM: CHEST - 2 VIEW COMPARISON:  Chest radiographs 05/19/2021 and earlier. FINDINGS: Lung volumes and mediastinal contours are stable and within normal limits. Visualized tracheal air column is within normal limits. No pneumothorax, pulmonary edema, pleural effusion or confluent pulmonary opacity. And mild diffuse increased pulmonary interstitial markings appear to be chronic, stable since 2020. No acute osseous abnormality identified. Negative visible bowel gas. IMPRESSION: Chronic probably smoking related pulmonary interstitial changes. No acute cardiopulmonary abnormality. Electronically Signed   By: Genevie Ann M.D.   On: 05/14/2022 09:35    Procedures Procedures (including critical care time)  Medications Ordered in UC Medications - No data to display  Initial Impression / Assessment and Plan / UC Course  I have reviewed the triage vital signs and the nursing notes.  Pertinent labs & imaging results that were available during my care of the patient were reviewed by me and considered in my medical decision making (see chart for details).  Covid test pending - if positive qualifies for antivirals. Will be on day 5 of symptoms when test results. GFR 62  Chest xray showing chronic interstitial changes stable since 2020. No acute cardiopulmonary disease.  Refilled albuterol inhaler to use q6 PRN Discussed continue mucinex 600 mg BID Sent Tessalon 100 mg 3 times daily PRN  Has PCP appointment coming up 10/24, will follow up with them. ED for any worsening symptoms. Patient agrees to plan  Final Clinical Impressions(s) / UC Diagnoses   Final  diagnoses:  Viral URI with cough     Discharge Instructions      Your xray was negative. We will call you if your covid test returns positive, and send the antivirals to your pharmacy.  I recommend to continue the mucinex for the next few days. You can try the tessalon up to three times daily for cough.  I have refilled your albuterol inhaler.  Follow up with your primary care provider. Please go to the emergency department if symptoms worsen.     ED Prescriptions     Medication Sig Dispense Auth. Provider   albuterol (VENTOLIN HFA) 108 (90 Base) MCG/ACT inhaler Inhale 2 puffs into the lungs every 4 (four) hours as needed for wheezing or shortness of breath. 18 g Caiya Bettes, PA-C   benzonatate (TESSALON) 100 MG capsule Take 1 capsule (100 mg total) by mouth 3 (three) times daily as needed for up to 4 days for cough. 12 capsule Kerianna Rawlinson, Wells Guiles, PA-C      PDMP not reviewed this encounter.   Les Pou, [place] 05/14/22 1017

## 2022-05-14 NOTE — ED Triage Notes (Signed)
Reports symptoms started Sunday.  Patient hhas cough, chest congestion, headache, and chest soreness with deep breathing and coughing makes it worse as well.    Has taken theraflu, mucinex.    Has not done a covid test  Ran out of albuterol inhaler yesterday

## 2022-05-14 NOTE — Discharge Instructions (Addendum)
Your xray was negative. We will call you if your covid test returns positive, and send the antivirals to your pharmacy.  I recommend to continue the mucinex for the next few days. You can try the tessalon up to three times daily for cough.  I have refilled your albuterol inhaler.  Follow up with your primary care provider. Please go to the emergency department if symptoms worsen.

## 2022-05-26 ENCOUNTER — Ambulatory Visit (INDEPENDENT_AMBULATORY_CARE_PROVIDER_SITE_OTHER): Payer: Medicare HMO | Admitting: Internal Medicine

## 2022-05-26 ENCOUNTER — Encounter: Payer: Self-pay | Admitting: Internal Medicine

## 2022-05-26 VITALS — BP 136/64 | HR 54 | Temp 98.1°F | Resp 16 | Ht 70.0 in | Wt 190.1 lb

## 2022-05-26 DIAGNOSIS — R739 Hyperglycemia, unspecified: Secondary | ICD-10-CM

## 2022-05-26 DIAGNOSIS — M545 Low back pain, unspecified: Secondary | ICD-10-CM | POA: Diagnosis not present

## 2022-05-26 DIAGNOSIS — D649 Anemia, unspecified: Secondary | ICD-10-CM | POA: Diagnosis not present

## 2022-05-26 DIAGNOSIS — E785 Hyperlipidemia, unspecified: Secondary | ICD-10-CM | POA: Diagnosis not present

## 2022-05-26 DIAGNOSIS — Z125 Encounter for screening for malignant neoplasm of prostate: Secondary | ICD-10-CM

## 2022-05-26 DIAGNOSIS — J310 Chronic rhinitis: Secondary | ICD-10-CM

## 2022-05-26 DIAGNOSIS — Z0000 Encounter for general adult medical examination without abnormal findings: Secondary | ICD-10-CM

## 2022-05-26 DIAGNOSIS — K2100 Gastro-esophageal reflux disease with esophagitis, without bleeding: Secondary | ICD-10-CM

## 2022-05-26 DIAGNOSIS — Z23 Encounter for immunization: Secondary | ICD-10-CM

## 2022-05-26 LAB — CBC WITH DIFFERENTIAL/PLATELET
Basophils Absolute: 0 10*3/uL (ref 0.0–0.1)
Basophils Relative: 0.5 % (ref 0.0–3.0)
Eosinophils Absolute: 0.4 10*3/uL (ref 0.0–0.7)
Eosinophils Relative: 5.1 % — ABNORMAL HIGH (ref 0.0–5.0)
HCT: 35.7 % — ABNORMAL LOW (ref 39.0–52.0)
Hemoglobin: 12 g/dL — ABNORMAL LOW (ref 13.0–17.0)
Lymphocytes Relative: 31.1 % (ref 12.0–46.0)
Lymphs Abs: 2.3 10*3/uL (ref 0.7–4.0)
MCHC: 33.7 g/dL (ref 30.0–36.0)
MCV: 95.9 fl (ref 78.0–100.0)
Monocytes Absolute: 0.6 10*3/uL (ref 0.1–1.0)
Monocytes Relative: 7.9 % (ref 3.0–12.0)
Neutro Abs: 4.1 10*3/uL (ref 1.4–7.7)
Neutrophils Relative %: 55.4 % (ref 43.0–77.0)
Platelets: 337 10*3/uL (ref 150.0–400.0)
RBC: 3.73 Mil/uL — ABNORMAL LOW (ref 4.22–5.81)
RDW: 13.3 % (ref 11.5–15.5)
WBC: 7.5 10*3/uL (ref 4.0–10.5)

## 2022-05-26 LAB — COMPREHENSIVE METABOLIC PANEL
ALT: 14 U/L (ref 0–53)
AST: 17 U/L (ref 0–37)
Albumin: 3.9 g/dL (ref 3.5–5.2)
Alkaline Phosphatase: 60 U/L (ref 39–117)
BUN: 22 mg/dL (ref 6–23)
CO2: 30 mEq/L (ref 19–32)
Calcium: 9 mg/dL (ref 8.4–10.5)
Chloride: 103 mEq/L (ref 96–112)
Creatinine, Ser: 1.14 mg/dL (ref 0.40–1.50)
GFR: 65.61 mL/min (ref >60.00)
Glucose, Bld: 109 mg/dL — ABNORMAL HIGH (ref 70–99)
Potassium: 4.5 mEq/L (ref 3.5–5.1)
Sodium: 138 mEq/L (ref 135–145)
Total Bilirubin: 0.7 mg/dL (ref 0.2–1.2)
Total Protein: 6.8 g/dL (ref 6.0–8.3)

## 2022-05-26 LAB — LIPID PANEL
Cholesterol: 148 mg/dL (ref 0–200)
HDL: 41.2 mg/dL (ref >39.00)
LDL Cholesterol: 90 mg/dL (ref 0–99)
NonHDL: 106.38
Total CHOL/HDL Ratio: 4
Triglycerides: 83 mg/dL (ref 0.0–149.0)
VLDL: 16.6 mg/dL (ref 0.0–40.0)

## 2022-05-26 LAB — PSA: PSA: 2.13 ng/mL (ref 0.10–4.00)

## 2022-05-26 LAB — TSH: TSH: 3.56 u[IU]/mL (ref 0.35–5.50)

## 2022-05-26 MED ORDER — CELECOXIB 100 MG PO CAPS: 100.0000 mg | ORAL_CAPSULE | Freq: Two times a day (BID) | ORAL | 1 refills | Status: DC | PRN

## 2022-05-26 MED ORDER — ATORVASTATIN CALCIUM 10 MG PO TABS: 10.0000 mg | ORAL_TABLET | Freq: Every day | ORAL | 1 refills | Status: DC

## 2022-05-26 MED ORDER — ALBUTEROL SULFATE HFA 108 (90 BASE) MCG/ACT IN AERS: 2.0000 | INHALATION_SPRAY | RESPIRATORY_TRACT | 2 refills | Status: DC | PRN

## 2022-05-26 MED ORDER — ESOMEPRAZOLE MAGNESIUM 40 MG PO CPDR: 40.0000 mg | DELAYED_RELEASE_CAPSULE | Freq: Two times a day (BID) | ORAL | 1 refills | Status: DC

## 2022-05-26 MED ORDER — BUDESONIDE-FORMOTEROL FUMARATE 160-4.5 MCG/ACT IN AERO: 2.0000 | INHALATION_SPRAY | Freq: Two times a day (BID) | RESPIRATORY_TRACT | 1 refills | Status: DC

## 2022-05-26 NOTE — Patient Instructions (Signed)
We will refer you for physical therapy to strengthen your back.     GO TO THE LAB : Get the blood work     [place], [place] Come back for a checkup in 4 months    Advanced care planning:  Do you have a "Living will", "[place] of attorney" ?   If you already have a living will or healthcare power of attorney, is recommended you bring the copy to be scanned in your chart. The document will be available to all the doctors you see in the system.  If you don't have one, please consider create one.      More information at:  [email or web address]

## 2022-05-26 NOTE — Progress Notes (Unsigned)
Subjective:    Patient ID: Brett Berry, male    DOB: November 25, 1950, 71 y.o.   MRN: [id number]  DOS:  05/26/2022 Type of visit - description: CPX  Since the last office visit is doing okay. Reports low back pain since approximately March 2023.  Can be left or right, happens occasionally, when it happens it radiates to the legs. Denies any lower extremity paresthesias, no bladder or bowel incontinence, no fever chills No injury. Takes ibuprofen rarely.   Review of Systems  Other than above, a 14 point review of systems is negative    Past Medical History:  Diagnosis Date   Allergic rhinitis    Asthma    Bilateral knee pain 2016   x-ray showed medial compartment arthritis, worse on right than left   Erectile dysfunction 07/30/2014   GERD (gastroesophageal reflux disease)    Hearing difficulty    has aids   History of kidney stones    Hyperlipidemia    intolerant to zocor   Pneumonia    Sinusitis, chronic     Past Surgical History:  Procedure Laterality Date   CARPAL TUNNEL RELEASE Right 06/2018   CATARACT EXTRACTION Right 2016   COLONOSCOPY  09/25/2011   MASTOIDECTOMY     right    POLYPECTOMY     right hand surg     for infection in [place]     TOE SURGERY     Left Great toe   TOTAL KNEE ARTHROPLASTY Bilateral 08/30/2018   TOTAL KNEE ARTHROPLASTY Bilateral 08/30/2018   Procedure: BILATERAL TOTAL KNEE ARTHROPLASTY;  Surgeon: Meredith Pel, MD;  Location: [facility];  Service: Orthopedics;  Laterality: Bilateral;   Social History   Socioeconomic History   Marital status: Married    Spouse name: Not on file   Number of children: 1   Years of education: Not on file   Highest education level: Not on file  Occupational History   Occupation: Heritage manager dept    Employer: [employer]  Tobacco Use   Smoking status: Former    Types: Cigarettes    Quit date: 09/08/1973    Years since quitting: 48.7   Smokeless tobacco: Never    Tobacco comments:    Quit I the 1980s  Vaping Use   Vaping Use: Never used  Substance and Sexual Activity   Alcohol use: Yes    Alcohol/week: 3.0 standard drinks of alcohol    Types: 3 Cans of beer per week   Drug use: No   Sexual activity: Yes    Partners: Female  Other Topics Concern   Not on file  Social History Narrative   HSG. Married '76- 6 years divorced ; '85. 1 son- '86.    Lost wife 12-2014   Remarried    Social Determinants of Health   Financial Resource Strain: Low Risk  (10/27/2021)   Overall Financial Resource Strain (CARDIA)    Difficulty of Paying Living Expenses: Not hard at all  Food Insecurity: No Food Insecurity (10/27/2021)   Hunger Vital Sign    Worried About Running Out of Food in the Last Year: Never true    Ran Out of Food in the Last Year: Never true  Transportation Needs: No Transportation Needs (10/27/2021)   PRAPARE - Hydrologist (Medical): No    Lack of Transportation (Non-Medical): No  Physical Activity: Sufficiently Active (10/27/2021)   Exercise Vital Sign  Days of Exercise per Week: 7 days    Minutes of Exercise per Session: 50 min  Stress: No Stress Concern Present (10/27/2021)   [place]    Feeling of Stress : Not at all  Social Connections: Moderately Isolated (10/27/2021)   Social Connection and Isolation Panel [NHANES]    Frequency of Communication with Friends and Family: More than three times a week    Frequency of Social Gatherings with Friends and Family: More than three times a week    Attends Religious Services: More than 4 times per year    Active Member of [employer] or Organizations: No    Attends Archivist Meetings: Never    Marital Status: Widowed  Intimate Partner Violence: Not At Risk (10/27/2021)   Humiliation, Afraid, Rape, and Kick questionnaire    Fear of Current or Ex-Partner: No    Emotionally Abused: No     Physically Abused: No    Sexually Abused: No    Current Outpatient Medications  Medication Instructions   albuterol (VENTOLIN HFA) 108 (90 Base) MCG/ACT inhaler 2 puffs, Inhalation, Every 4 hours PRN   atorvastatin (LIPITOR) 10 mg, Oral, Daily at bedtime   budesonide-formoterol (SYMBICORT) 160-4.5 MCG/ACT inhaler 2 puffs, Inhalation, 2 times daily   celecoxib (CELEBREX) 100 mg, Oral, 2 times daily PRN   Coenzyme Q10 (CO Q 10 PO) 1 capsule, Oral, Daily   esomeprazole (NEXIUM) 40 mg, Oral, 2 times daily before meals   Fish Oil 1,000 mg, Oral, 2 times daily   fluticasone (FLONASE) 50 MCG/ACT nasal spray 2 sprays, Each Nare, Daily   magnesium oxide (MAG-OX) 400 mg, Oral, Daily   Multiple Vitamin (MULTIVITAMIN WITH MINERALS) TABS tablet 1 tablet, Oral, Daily       Objective:   Physical Exam BP 136/64   Pulse (!) 54   Temp 98.1 F (36.7 C) (Oral)   Resp 16   Ht '5\' 10"'$  (1.778 m)   Wt 190 lb 2 oz (86.2 kg)   SpO2 97%   BMI 27.28 kg/m  General: Well developed, NAD, BMI noted Neck: No  thyromegaly  HEENT:  Normocephalic . Face symmetric, atraumatic Lungs:  CTA B Normal respiratory effort, no intercostal retractions, no accessory muscle use. Heart: RRR,  no murmur.  Abdomen:  Not distended, soft, non-tender. No rebound or rigidity.   Lower extremities: no pretibial edema bilaterally MSK: No TTP at the lower back Skin: Exposed areas without rash. Not pale. Not jaundice Neurologic:  alert & oriented X3.  Speech normal, gait appropriate for age and unassisted Strength symmetric and appropriate for age. Straight leg test negative Psych: Cognition and judgment appear intact.  Cooperative with normal attention span and concentration.  Behavior appropriate. No anxious or depressed appearing.     Assessment    Assessment Hyperlipidemia ---intolerant to simvastatin Asthma GERD E.D. Chronic sinusitis, Rhinitis  DJD- knees R>>L, wrists  , Dr Sharol Given [facility] has aids Chronic  perforated TM (R) Bradycardia   PLAN: Here for CPX Hyperlipidemia: Continue atorvastatin, check labs. GERD: Symptoms controlled Low back pain: See HPI, no red flags, recommend a round of physical therapy and reassess in 3 months. Patient in agreement. RTC 4 months

## 2022-05-27 ENCOUNTER — Encounter: Payer: Self-pay | Admitting: Internal Medicine

## 2022-05-27 NOTE — Assessment & Plan Note (Signed)
-  Td 2022 - PNM shot 2013; PNM 13: 2015; PNM 20: Today -  zostavax 2016;  S/p  shingrex x 2 2019 -RSV D/W patient - Covid vaccine : Declined  - flu shot today -[facility]- 09/25/11-adenomatous polyps and diverticulosis; C-scope 11-2019, next per GI -Prostate cancer screening: No symptoms, check PSA. - Labs: CMP FLP CBC TSH PSA -Diet -exercise discussed, he is doing well -POA discussed

## 2022-05-27 NOTE — Assessment & Plan Note (Signed)
Here for CPX Hyperlipidemia: Continue atorvastatin, check labs. GERD: Symptoms controlled Low back pain: See HPI, no red flags, recommend a round of physical therapy and reassess in 3 months. Patient in agreement. RTC 4 months

## 2022-05-29 NOTE — Addendum Note (Signed)
Addended byDamita Dunnings D on: 05/29/2022 07:50 AM   Modules accepted: Orders

## 2022-06-02 DIAGNOSIS — R293 Abnormal posture: Secondary | ICD-10-CM | POA: Diagnosis not present

## 2022-06-02 DIAGNOSIS — M2569 Stiffness of other specified joint, not elsewhere classified: Secondary | ICD-10-CM | POA: Diagnosis not present

## 2022-06-02 DIAGNOSIS — R531 Weakness: Secondary | ICD-10-CM | POA: Diagnosis not present

## 2022-06-02 DIAGNOSIS — M5489 Other dorsalgia: Secondary | ICD-10-CM | POA: Diagnosis not present

## 2022-06-05 ENCOUNTER — Telehealth: Payer: Self-pay

## 2022-06-05 DIAGNOSIS — M5489 Other dorsalgia: Secondary | ICD-10-CM | POA: Diagnosis not present

## 2022-06-05 DIAGNOSIS — M2569 Stiffness of other specified joint, not elsewhere classified: Secondary | ICD-10-CM | POA: Diagnosis not present

## 2022-06-05 DIAGNOSIS — R293 Abnormal posture: Secondary | ICD-10-CM | POA: Diagnosis not present

## 2022-06-05 DIAGNOSIS — R531 Weakness: Secondary | ICD-10-CM | POA: Diagnosis not present

## 2022-06-05 NOTE — Telephone Encounter (Signed)
Plan of care signed and faxed back to [facility] at [phone]. Form sent for scanning.

## 2022-06-08 DIAGNOSIS — M2569 Stiffness of other specified joint, not elsewhere classified: Secondary | ICD-10-CM | POA: Diagnosis not present

## 2022-06-08 DIAGNOSIS — M5489 Other dorsalgia: Secondary | ICD-10-CM | POA: Diagnosis not present

## 2022-06-08 DIAGNOSIS — R531 Weakness: Secondary | ICD-10-CM | POA: Diagnosis not present

## 2022-06-08 DIAGNOSIS — R293 Abnormal posture: Secondary | ICD-10-CM | POA: Diagnosis not present

## 2022-06-10 DIAGNOSIS — Z961 Presence of intraocular lens: Secondary | ICD-10-CM | POA: Diagnosis not present

## 2022-06-10 DIAGNOSIS — H40013 Open angle with borderline findings, low risk, bilateral: Secondary | ICD-10-CM | POA: Diagnosis not present

## 2022-06-10 DIAGNOSIS — H2512 Age-related nuclear cataract, left eye: Secondary | ICD-10-CM | POA: Diagnosis not present

## 2022-06-10 DIAGNOSIS — D492 Neoplasm of unspecified behavior of bone, soft tissue, and skin: Secondary | ICD-10-CM | POA: Diagnosis not present

## 2022-06-10 DIAGNOSIS — H43813 Vitreous degeneration, bilateral: Secondary | ICD-10-CM | POA: Diagnosis not present

## 2022-06-12 DIAGNOSIS — R293 Abnormal posture: Secondary | ICD-10-CM | POA: Diagnosis not present

## 2022-06-12 DIAGNOSIS — M5489 Other dorsalgia: Secondary | ICD-10-CM | POA: Diagnosis not present

## 2022-06-12 DIAGNOSIS — R531 Weakness: Secondary | ICD-10-CM | POA: Diagnosis not present

## 2022-06-12 DIAGNOSIS — M2569 Stiffness of other specified joint, not elsewhere classified: Secondary | ICD-10-CM | POA: Diagnosis not present

## 2022-06-15 DIAGNOSIS — M2569 Stiffness of other specified joint, not elsewhere classified: Secondary | ICD-10-CM | POA: Diagnosis not present

## 2022-06-15 DIAGNOSIS — R531 Weakness: Secondary | ICD-10-CM | POA: Diagnosis not present

## 2022-06-15 DIAGNOSIS — R293 Abnormal posture: Secondary | ICD-10-CM | POA: Diagnosis not present

## 2022-06-15 DIAGNOSIS — M5489 Other dorsalgia: Secondary | ICD-10-CM | POA: Diagnosis not present

## 2022-06-22 DIAGNOSIS — R531 Weakness: Secondary | ICD-10-CM | POA: Diagnosis not present

## 2022-06-22 DIAGNOSIS — R293 Abnormal posture: Secondary | ICD-10-CM | POA: Diagnosis not present

## 2022-06-22 DIAGNOSIS — M2569 Stiffness of other specified joint, not elsewhere classified: Secondary | ICD-10-CM | POA: Diagnosis not present

## 2022-06-22 DIAGNOSIS — M5489 Other dorsalgia: Secondary | ICD-10-CM | POA: Diagnosis not present

## 2022-06-29 DIAGNOSIS — M5489 Other dorsalgia: Secondary | ICD-10-CM | POA: Diagnosis not present

## 2022-06-29 DIAGNOSIS — M2569 Stiffness of other specified joint, not elsewhere classified: Secondary | ICD-10-CM | POA: Diagnosis not present

## 2022-06-29 DIAGNOSIS — R293 Abnormal posture: Secondary | ICD-10-CM | POA: Diagnosis not present

## 2022-06-29 DIAGNOSIS — R531 Weakness: Secondary | ICD-10-CM | POA: Diagnosis not present

## 2022-07-02 DIAGNOSIS — M5489 Other dorsalgia: Secondary | ICD-10-CM | POA: Diagnosis not present

## 2022-07-02 DIAGNOSIS — M2569 Stiffness of other specified joint, not elsewhere classified: Secondary | ICD-10-CM | POA: Diagnosis not present

## 2022-07-02 DIAGNOSIS — R531 Weakness: Secondary | ICD-10-CM | POA: Diagnosis not present

## 2022-07-02 DIAGNOSIS — R293 Abnormal posture: Secondary | ICD-10-CM | POA: Diagnosis not present

## 2022-07-06 DIAGNOSIS — R531 Weakness: Secondary | ICD-10-CM | POA: Diagnosis not present

## 2022-07-06 DIAGNOSIS — M2569 Stiffness of other specified joint, not elsewhere classified: Secondary | ICD-10-CM | POA: Diagnosis not present

## 2022-07-06 DIAGNOSIS — R293 Abnormal posture: Secondary | ICD-10-CM | POA: Diagnosis not present

## 2022-07-06 DIAGNOSIS — M5489 Other dorsalgia: Secondary | ICD-10-CM | POA: Diagnosis not present

## 2022-07-08 IMAGING — DX DG CHEST 2V
2 series · 2 of 2 positions shown · non-contrast
Comparison: [DATE] [DATE], [DATE] [DATE] [DATE], [DATE]

CLINICAL DATA: CAP f/u

EXAM:
CHEST - 2 VIEW

[chest pa]
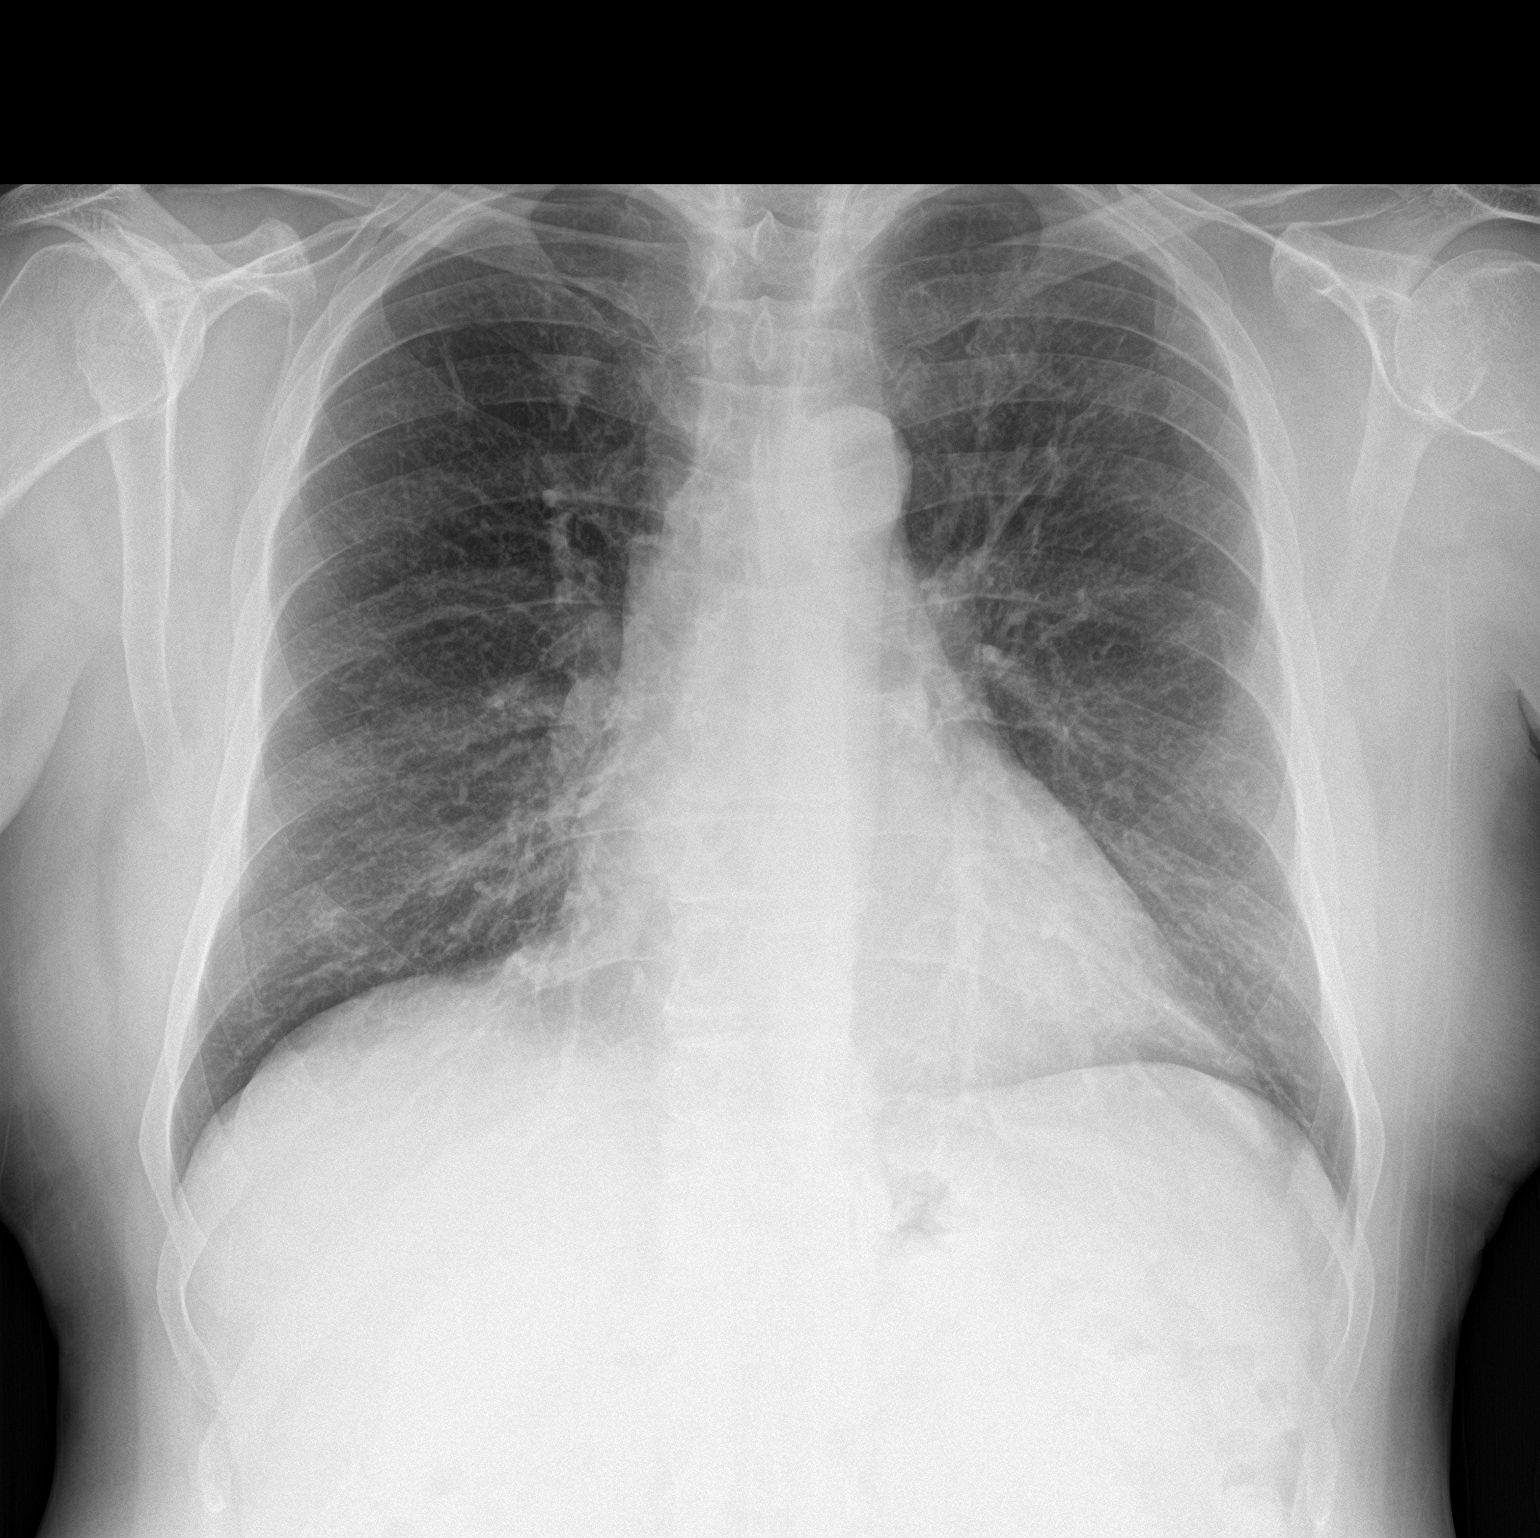

[chest lat]
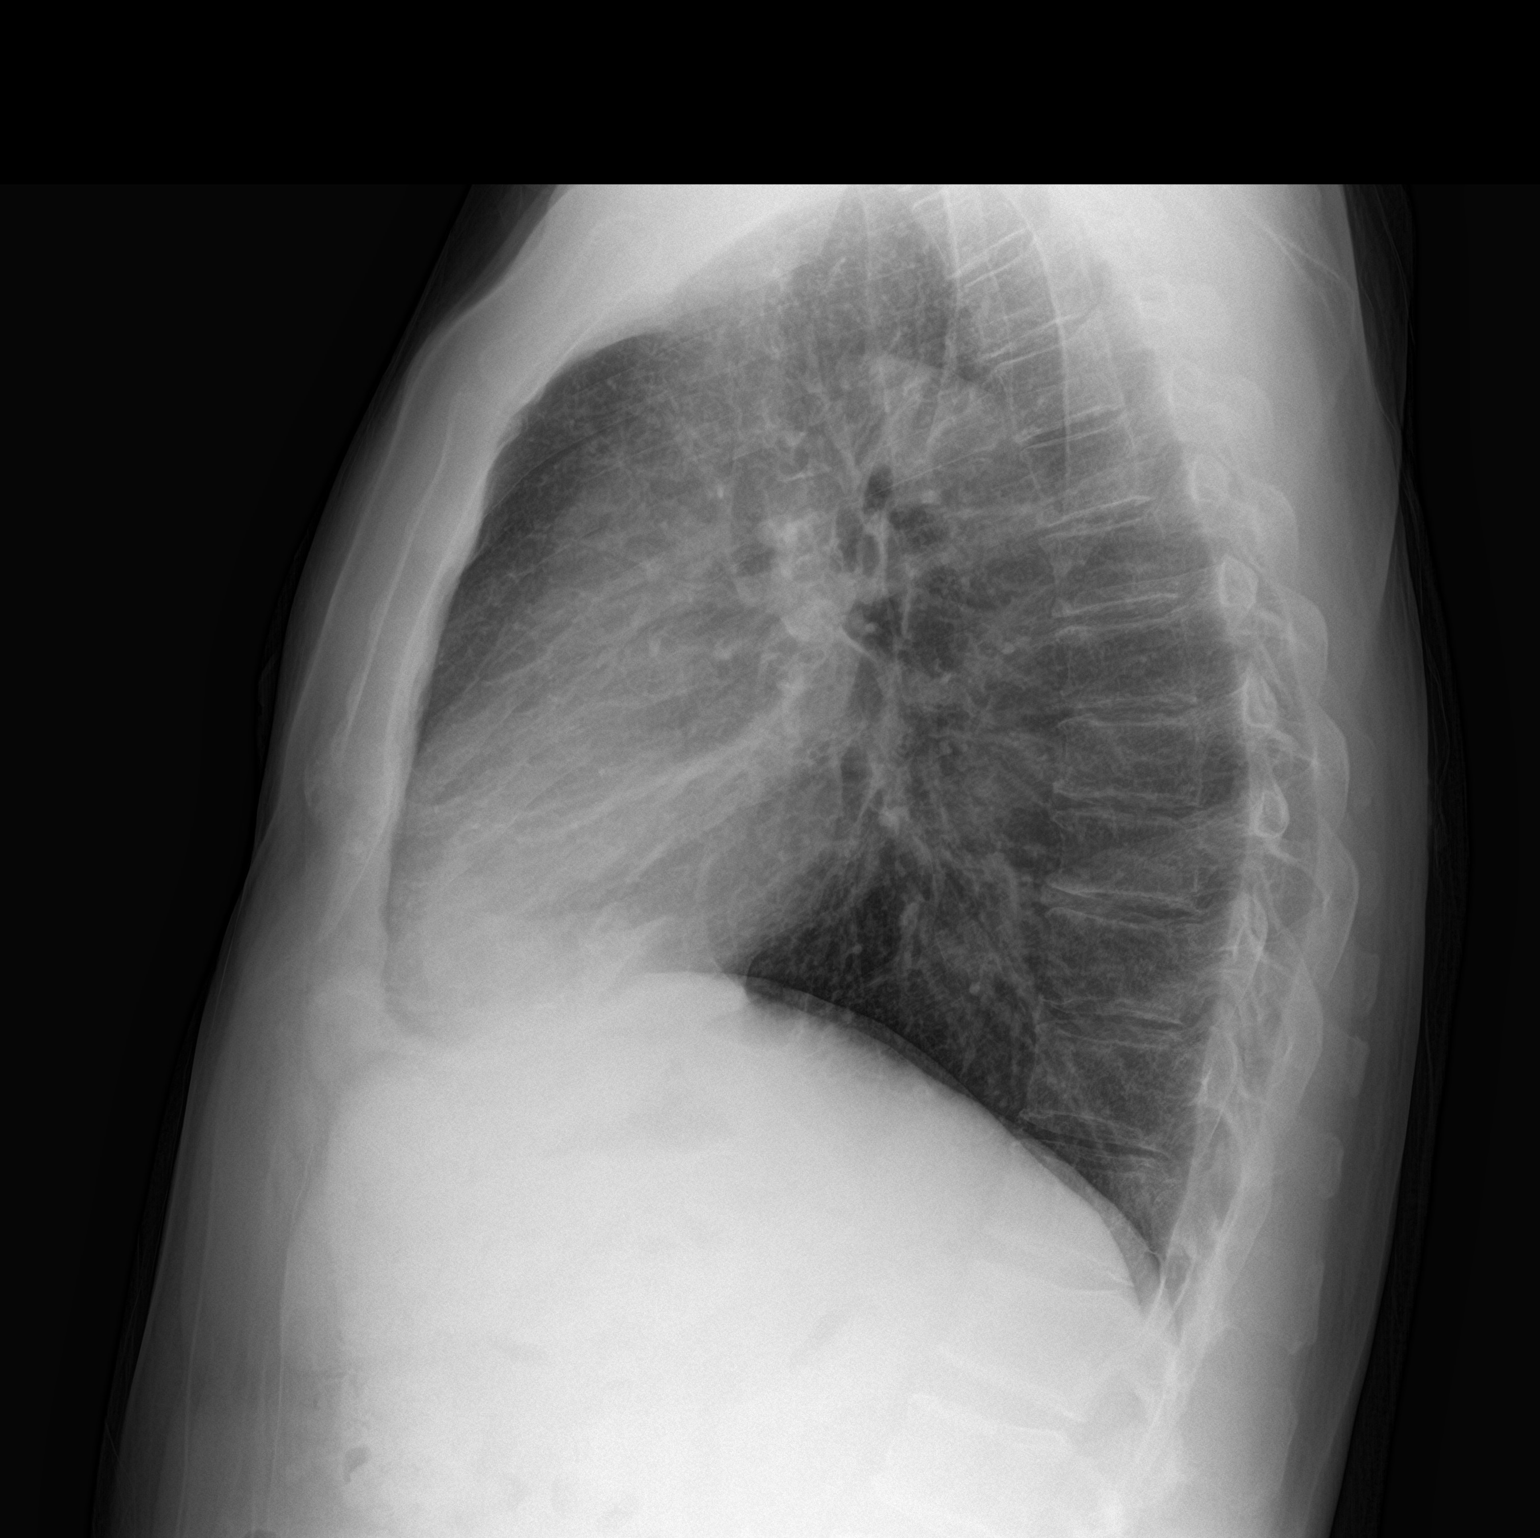

[2 of 2 positions shown; findings below may reference images not displayed]

FINDINGS: The cardiomediastinal silhouette is unchanged in contour.Interval
resolution of a LEFT upper lobe opacity. No pleural effusion. No
pneumothorax. No acute pleuroparenchymal abnormality. Visualized
abdomen is unremarkable. Mild degenerative changes of the thoracic
spine.
IMPRESSION: Interval resolution of the LEFT upper lobe opacity consistent with
improved infection.

## 2022-07-23 DIAGNOSIS — H2512 Age-related nuclear cataract, left eye: Secondary | ICD-10-CM | POA: Diagnosis not present

## 2022-08-06 ENCOUNTER — Encounter: Payer: Self-pay | Admitting: Internal Medicine

## 2022-09-03 HISTORY — PX: VITRECTOMY AND CATARACT: SHX6184

## 2022-09-25 ENCOUNTER — Ambulatory Visit: Payer: Medicare HMO | Admitting: Internal Medicine

## 2022-10-02 ENCOUNTER — Ambulatory Visit (INDEPENDENT_AMBULATORY_CARE_PROVIDER_SITE_OTHER): Payer: Medicare HMO | Admitting: Internal Medicine

## 2022-10-02 VITALS — BP 130/78 | HR 51 | Temp 98.0°F | Resp 12 | Ht 70.0 in | Wt 187.4 lb

## 2022-10-02 DIAGNOSIS — R739 Hyperglycemia, unspecified: Secondary | ICD-10-CM | POA: Diagnosis not present

## 2022-10-02 DIAGNOSIS — Z09 Encounter for follow-up examination after completed treatment for conditions other than malignant neoplasm: Secondary | ICD-10-CM | POA: Diagnosis not present

## 2022-10-02 DIAGNOSIS — D649 Anemia, unspecified: Secondary | ICD-10-CM

## 2022-10-02 NOTE — Progress Notes (Unsigned)
Subjective:    Patient ID: Brett Berry, male    DOB: [birth year], 72 y.o.   MRN: [id number]  DOS:  10/02/2022 Type of visit - description: Follow-up  Routine follow-up Had low back pain, did physical therapy, feeling great. Good med compliance. Most recent hemoglobin was slightly low.  Denies any nausea vomiting.  No abdominal pain, usually on weight loss.  He is not on blood normal. Asthma is well-controlled  Complains of cold hands, from time to time the heat turned pale but never red or bluish. Symptoms are more common in the winter but in the summer if he grips a cold beverage he has no problems.  Review of Systems See above   Past Medical History:  Diagnosis Date   Allergic rhinitis    Asthma    Bilateral knee pain 2016   x-ray showed medial compartment arthritis, worse on right than left   Erectile dysfunction 07/30/2014   GERD (gastroesophageal reflux disease)    Hearing difficulty    has aids   History of kidney stones    Hyperlipidemia    intolerant to zocor   Pneumonia    Sinusitis, chronic     Past Surgical History:  Procedure Laterality Date   CARPAL TUNNEL RELEASE Right 06/2018   CATARACT EXTRACTION Right 2016   COLONOSCOPY  09/25/2011   MASTOIDECTOMY     right    POLYPECTOMY     right hand surg     for infection in [place]     TOE SURGERY     Left Great toe   TOTAL KNEE ARTHROPLASTY Bilateral 08/30/2018   TOTAL KNEE ARTHROPLASTY Bilateral 08/30/2018   Procedure: BILATERAL TOTAL KNEE ARTHROPLASTY;  Surgeon: Meredith Pel, MD;  Location: [facility];  Service: Orthopedics;  Laterality: Bilateral;    Current Outpatient Medications  Medication Instructions   albuterol (VENTOLIN HFA) 108 (90 Base) MCG/ACT inhaler 2 puffs, Inhalation, Every 4 hours PRN   atorvastatin (LIPITOR) 10 mg, Oral, Daily at bedtime   budesonide-formoterol (SYMBICORT) 160-4.5 MCG/ACT inhaler 2 puffs, Inhalation, 2 times daily   celecoxib (CELEBREX) 100 mg,  Oral, 2 times daily PRN   Coenzyme Q10 (CO Q 10 PO) 1 capsule, Oral, Daily   esomeprazole (NEXIUM) 40 mg, Oral, 2 times daily before meals   Fish Oil 1,000 mg, Oral, 2 times daily   fluticasone (FLONASE) 50 MCG/ACT nasal spray 2 sprays, Each Nare, Daily   magnesium oxide (MAG-OX) 400 mg, Oral, Daily   Multiple Vitamin (MULTIVITAMIN WITH MINERALS) TABS tablet 1 tablet, Oral, Daily       Objective:   Physical Exam BP 130/78 (BP Location: Left Arm, Cuff Size: Normal)   Pulse (!) 51   Temp 98 F (36.7 C) (Oral)   Resp 12   Ht '5\' 10"'$  (1.778 m)   Wt 187 lb 6.4 oz (85 kg)   SpO2 92%   BMI 26.89 kg/m  General:   Well developed, NAD, BMI noted. HEENT:  Normocephalic . Face symmetric, atraumatic Lungs:  CTA B Normal respiratory effort, no intercostal retractions, no accessory muscle use. Heart: RRR,  no murmur.  Lower extremities: no pretibial edema bilaterally Hands: Good radial pulses bilaterally, good capillary refill, no ulcers.  Normal skin. Skin: Not pale. Not jaundice Neurologic:  alert & oriented X3.  Speech normal, gait appropriate for age and unassisted Psych--  Cognition and judgment appear intact.  Cooperative with normal attention span and concentration.  Behavior appropriate. No anxious  or depressed appearing.      Assessment    Assessment Hyperlipidemia ---intolerant to simvastatin Asthma GERD E.D. ENT: --Chronic sinusitis, Rhinitis --Chronic hoarseness --Chronic perforated TM (R) --HOH has aids DJD- knees R>>L, wrists  , Dr Sharol Given Bradycardia   PLAN: Hyperlipidemia: On Lipitor, last FLP very good Asthma: Well-controlled with current meds Mild hyperglycemia: Check A1c Anemia: See last visit, no GI symptoms, checking a CBC, iron, ferritin Low back pain: See last visit, did PT, symptoms resolved Cold hands: Reports cold hands and occasionally they turn pale but not red or fluish.  Early Raynaud phenomena?  We agreed on observation O2 sat: When he  arrived O2 sat was 92%, denies shortness of breath, recheck 96%. RTC 05-2019 for CPX   Here for CPX Hyperlipidemia: Continue atorvastatin, check labs. GERD: Symptoms controlled Low back pain: See HPI, no red flags, recommend a round of physical therapy and reassess in 3 months. Patient in agreement. RTC 4 months

## 2022-10-02 NOTE — Patient Instructions (Signed)
    GO TO THE LAB : Get the blood work     [place], PLEASE SCHEDULE YOUR APPOINTMENTS Come back for yearly physical exam by October 2024.  Please reach out if you have any questions or concerns

## 2022-10-03 LAB — CBC
HCT: 37 % — ABNORMAL LOW (ref 38.5–50.0)
Hemoglobin: 12.6 g/dL — ABNORMAL LOW (ref 13.2–17.1)
MCH: 32.5 pg (ref 27.0–33.0)
MCHC: 34.1 g/dL (ref 32.0–36.0)
MCV: 95.4 fL (ref 80.0–100.0)
MPV: 11.5 fL (ref 7.5–12.5)
Platelets: 279 10*3/uL (ref 140–400)
RBC: 3.88 10*6/uL — ABNORMAL LOW (ref 4.20–5.80)
RDW: 12.7 % (ref 11.0–15.0)
WBC: 9.3 10*3/uL (ref 3.8–10.8)

## 2022-10-03 LAB — IRON: Iron: 97 ug/dL (ref 50–180)

## 2022-10-03 LAB — HEMOGLOBIN A1C
Hgb A1c MFr Bld: 5.8 % of total Hgb — ABNORMAL HIGH (ref <5.7)
Mean Plasma Glucose: 120 mg/dL
eAG (mmol/L): 6.6 mmol/L

## 2022-10-03 LAB — FERRITIN: Ferritin: 41 ng/mL (ref 24–380)

## 2022-10-04 NOTE — Assessment & Plan Note (Signed)
Hyperlipidemia: On Lipitor, last FLP very good Asthma: Well-controlled with current meds Mild hyperglycemia: Check A1c Anemia: See last visit, no GI symptoms, checking a CBC, iron, ferritin Low back pain: See last visit, did PT, symptoms resolved Cold hands: Reports cold hands and occasionally they turn pale but not red or fluish. Raynaud's? We agreed on observation O2 sat: When he arrived O2 sat was 92%, denies shortness of breath, recheck 96%. RTC 05-2019 for CPX

## 2022-10-29 ENCOUNTER — Ambulatory Visit (INDEPENDENT_AMBULATORY_CARE_PROVIDER_SITE_OTHER): Payer: Medicare HMO | Admitting: *Deleted

## 2022-10-29 DIAGNOSIS — Z0000 Encounter for general adult medical examination without abnormal findings: Secondary | ICD-10-CM

## 2022-10-29 NOTE — Patient Instructions (Signed)
Brett Berry , Thank you for taking time to come for your Medicare Wellness Visit. I appreciate your ongoing commitment to your health goals. Please review the following plan we discussed and let me know if I can assist you in the future.     This is a list of the screening recommended for you and due dates:  Health Maintenance  Topic Date Due   Medicare Annual Wellness Visit  10/29/2023   Colon Cancer Screening  11/27/2026   DTaP/Tdap/Td vaccine (3 - Td or Tdap) 08/02/2031   Pneumonia Vaccine  Completed   Flu Shot  Completed   Hepatitis C Screening: USPSTF Recommendation to screen - Ages 18-79 yo.  Completed   Zoster (Shingles) Vaccine  Completed   HPV Vaccine  Aged Out   COVID-19 Vaccine  Discontinued    Next appointment: Follow up in one year for your annual wellness visit.   Preventive Care 65 Years and Older, Male Preventive care refers to lifestyle choices and visits with your health care provider that can promote health and wellness. What does preventive care include? A yearly physical exam. This is also called an annual well check. Dental exams once or twice a year. Routine eye exams. Ask your health care provider how often you should have your eyes checked. Personal lifestyle choices, including: Daily care of your teeth and gums. Regular physical activity. Eating a healthy diet. Avoiding tobacco and drug use. Limiting alcohol use. Practicing safe sex. Taking low doses of aspirin every day. Taking vitamin and mineral supplements as recommended by your health care provider. What happens during an annual well check? The services and screenings done by your health care provider during your annual well check will depend on your age, overall health, lifestyle risk factors, and family history of disease. Counseling  Your health care provider may ask you questions about your: Alcohol use. Tobacco use. Drug use. Emotional well-being. Home and relationship well-being. Sexual  activity. Eating habits. History of falls. Memory and ability to understand (cognition). Work and work Statistician. Screening  You may have the following tests or measurements: Height, weight, and BMI. Blood pressure. Lipid and cholesterol levels. These may be checked every 5 years, or more frequently if you are over 49 years old. Skin check. Lung cancer screening. You may have this screening every year starting at age 25 if you have a 30-pack-year history of smoking and currently smoke or have quit within the past 15 years. Fecal occult blood test (FOBT) of the stool. You may have this test every year starting at age 32. Flexible sigmoidoscopy or colonoscopy. You may have a sigmoidoscopy every 5 years or a colonoscopy every 10 years starting at age 64. Prostate cancer screening. Recommendations will vary depending on your family history and other risks. Hepatitis C blood test. Hepatitis B blood test. Sexually transmitted disease (STD) testing. Diabetes screening. This is done by checking your blood sugar (glucose) after you have not eaten for a while (fasting). You may have this done every 1-3 years. Abdominal aortic aneurysm (AAA) screening. You may need this if you are a current or former smoker. Osteoporosis. You may be screened starting at age 2 if you are at high risk. Talk with your health care provider about your test results, treatment options, and if necessary, the need for more tests. Vaccines  Your health care provider may recommend certain vaccines, such as: Influenza vaccine. This is recommended every year. Tetanus, diphtheria, and acellular pertussis (Tdap, Td) vaccine. You may need a  Td booster every 10 years. Zoster vaccine. You may need this after age 86. Pneumococcal 13-valent conjugate (PCV13) vaccine. One dose is recommended after age 13. Pneumococcal polysaccharide (PPSV23) vaccine. One dose is recommended after age 41. Talk to your health care provider about which  screenings and vaccines you need and how often you need them. This information is not intended to replace advice given to you by your health care provider. Make sure you discuss any questions you have with your health care provider. Document Released: 08/16/2015 Document Revised: 04/08/2016 Document Reviewed: 05/21/2015 Elsevier Interactive Patient Education  2017 [employer] Prevention in the Home Falls can cause injuries. They can happen to people of all ages. There are many things you can do to make your home safe and to help prevent falls. What can I do on the outside of my home? Regularly fix the edges of walkways and driveways and fix any cracks. Remove anything that might make you trip as you walk through a door, such as a raised step or threshold. Trim any bushes or trees on the path to your home. Use bright outdoor lighting. Clear any walking paths of anything that might make someone trip, such as rocks or tools. Regularly check to see if handrails are loose or broken. Make sure that both sides of any steps have handrails. Any raised decks and porches should have guardrails on the edges. Have any leaves, snow, or ice cleared regularly. Use sand or salt on walking paths during winter. Clean up any spills in your garage right away. This includes oil or grease spills. What can I do in the bathroom? Use night lights. Install grab bars by the toilet and in the tub and shower. Do not use towel bars as grab bars. Use non-skid mats or decals in the tub or shower. If you need to sit down in the shower, use a plastic, non-slip stool. Keep the floor dry. Clean up any water that spills on the floor as soon as it happens. Remove soap buildup in the tub or shower regularly. Attach bath mats securely with double-sided non-slip rug tape. Do not have throw rugs and other things on the floor that can make you trip. What can I do in the bedroom? Use night lights. Make sure that you have a  light by your bed that is easy to reach. Do not use any sheets or blankets that are too big for your bed. They should not hang down onto the floor. Have a firm chair that has side arms. You can use this for support while you get dressed. Do not have throw rugs and other things on the floor that can make you trip. What can I do in the kitchen? Clean up any spills right away. Avoid walking on wet floors. Keep items that you use a lot in easy-to-reach places. If you need to reach something above you, use a strong step stool that has a grab bar. Keep electrical cords out of the way. Do not use floor polish or wax that makes floors slippery. If you must use wax, use non-skid floor wax. Do not have throw rugs and other things on the floor that can make you trip. What can I do with my stairs? Do not leave any items on the stairs. Make sure that there are handrails on both sides of the stairs and use them. Fix handrails that are broken or loose. Make sure that handrails are as long as the stairways. Check any  carpeting to make sure that it is firmly attached to the stairs. Fix any carpet that is loose or worn. Avoid having throw rugs at the top or bottom of the stairs. If you do have throw rugs, attach them to the floor with carpet tape. Make sure that you have a light switch at the top of the stairs and the bottom of the stairs. If you do not have them, ask someone to add them for you. What else can I do to help prevent falls? Wear shoes that: Do not have high heels. Have rubber bottoms. Are comfortable and fit you well. Are closed at the toe. Do not wear sandals. If you use a stepladder: Make sure that it is fully opened. Do not climb a closed stepladder. Make sure that both sides of the stepladder are locked into place. Ask someone to hold it for you, if possible. Clearly mark and make sure that you can see: Any grab bars or handrails. First and last steps. Where the edge of each step  is. Use tools that help you move around (mobility aids) if they are needed. These include: Canes. Walkers. Scooters. Crutches. Turn on the lights when you go into a dark area. Replace any light bulbs as soon as they burn out. Set up your furniture so you have a clear path. Avoid moving your furniture around. If any of your floors are uneven, fix them. If there are any pets around you, be aware of where they are. Review your medicines with your doctor. Some medicines can make you feel dizzy. This can increase your chance of falling. Ask your doctor what other things that you can do to help prevent falls. This information is not intended to replace advice given to you by your health care provider. Make sure you discuss any questions you have with your health care provider. Document Released: 05/16/2009 Document Revised: 12/26/2015 Document Reviewed: 08/24/2014 Elsevier Interactive Patient Education  2017 [employer].

## 2022-10-29 NOTE — Progress Notes (Signed)
Subjective:   Brett Berry is a 72 y.o. male who presents for Medicare Annual/Subsequent preventive examination.  I connected with  Brett Berry on 10/29/22 by a audio enabled telemedicine application and verified that I am speaking with the correct person using two identifiers.  Patient Location: Home  Provider Location: Office/Clinic  I discussed the limitations of evaluation and management by telemedicine. The patient expressed understanding and agreed to proceed.   Review of Systems     Cardiac Risk Factors include: male gender;advanced age (>39men, >67 women);dyslipidemia     Objective:    There were no vitals filed for this visit. There is no height or weight on file to calculate BMI.     10/29/2022   12:58 PM 05/04/2022    2:59 PM 01/27/2022    4:58 PM 10/27/2021    1:35 PM 02/15/2021   11:58 AM 10/17/2019    8:12 AM 09/29/2018   11:08 AM  Advanced Directives  Does Patient Have a Medical Advance Directive? No No No No No No No  Would patient like information on creating a medical advance directive? No - Patient declined No - Patient declined   No - Patient declined No - Patient declined No - Patient declined    Current Medications (verified) Outpatient Encounter Medications as of 10/29/2022  Medication Sig   albuterol (VENTOLIN HFA) 108 (90 Base) MCG/ACT inhaler Inhale 2 puffs into the lungs every 4 (four) hours as needed for wheezing or shortness of breath.   atorvastatin (LIPITOR) 10 MG tablet Take 1 tablet (10 mg total) by mouth at bedtime.   budesonide-formoterol (SYMBICORT) 160-4.5 MCG/ACT inhaler Inhale 2 puffs into the lungs in the morning and at bedtime.   celecoxib (CELEBREX) 100 MG capsule Take 1 capsule (100 mg total) by mouth 2 (two) times daily as needed for moderate pain.   Coenzyme Q10 (CO Q 10 PO) Take 1 capsule by mouth daily.    esomeprazole (NEXIUM) 40 MG capsule Take 1 capsule (40 mg total) by mouth 2 (two) times daily before a meal.   fluticasone  (FLONASE) 50 MCG/ACT nasal spray Place 2 sprays into both nostrils daily for 14 days.   magnesium oxide (MAG-OX) 400 MG tablet Take 400 mg by mouth daily.   Multiple Vitamin (MULTIVITAMIN WITH MINERALS) TABS tablet Take 1 tablet by mouth daily.   Omega-3 Fatty Acids (FISH OIL) 1000 MG CAPS Take 1,000 mg by mouth 2 (two) times daily.    No facility-administered encounter medications on file as of 10/29/2022.    Allergies (verified) Patient has no known allergies.   History: Past Medical History:  Diagnosis Date   Allergic rhinitis    Asthma    Bilateral knee pain 2016   x-ray showed medial compartment arthritis, worse on right than left   Erectile dysfunction 07/30/2014   GERD (gastroesophageal reflux disease)    Hearing difficulty    has aids   History of kidney stones    Hyperlipidemia    intolerant to zocor   Pneumonia    Sinusitis, chronic    Past Surgical History:  Procedure Laterality Date   CARPAL TUNNEL RELEASE Right 06/2018   CATARACT EXTRACTION Right 2016   COLONOSCOPY  09/25/2011   EYE SURGERY  10-15-2014   Cataract surgery, right eye   JOINT REPLACEMENT  08/30/2018   MASTOIDECTOMY     right    POLYPECTOMY     right hand surg     for infection in '07  SINUS ENDO WITH FUSION     TOE SURGERY     Left Great toe   TOTAL KNEE ARTHROPLASTY Bilateral 08/30/2018   TOTAL KNEE ARTHROPLASTY Bilateral 08/30/2018   Procedure: BILATERAL TOTAL KNEE ARTHROPLASTY;  Surgeon: Meredith Pel, MD;  Location: [place];  Service: Orthopedics;  Laterality: Bilateral;   Family History  Problem Relation Age of Onset   COPD Mother    Breast cancer Mother    Colon polyps Mother    Alzheimer's disease Father    Lung cancer Father        smoker   Prostate cancer Father 85       died age 94   Heart disease Father        age 108   Colon cancer Neg Hx    Esophageal cancer Neg Hx    Rectal cancer Neg Hx    Stomach cancer Neg Hx    Social History   Socioeconomic History    Marital status: Married    Spouse name: Not on file   Number of children: 1   Years of education: Not on file   Highest education level: Not on file  Occupational History   Occupation: Heritage manager dept    Employer: [employer]  Tobacco Use   Smoking status: Former    Types: Cigarettes    Quit date: 09/08/1973    Years since quitting: 49.1   Smokeless tobacco: Never   Tobacco comments:    Quit I the 1980s  Vaping Use   Vaping Use: Never used  Substance and Sexual Activity   Alcohol use: Yes    Alcohol/week: 3.0 standard drinks of alcohol    Types: 3 Cans of beer per week   Drug use: No   Sexual activity: Yes    Partners: Female  Other Topics Concern   Not on file  Social History Narrative   HSG. Married '76- 6 years divorced ; '85. 1 son- '86.    Lost wife 12-2014   Remarried    Social Determinants of Health   Financial Resource Strain: Low Risk  (10/27/2021)   Overall Financial Resource Strain (CARDIA)    Difficulty of Paying Living Expenses: Not hard at all  Food Insecurity: No Food Insecurity (10/29/2022)   Hunger Vital Sign    Worried About Running Out of Food in the Last Year: Never true    Ran Out of Food in the Last Year: Never true  Transportation Needs: No Transportation Needs (10/29/2022)   PRAPARE - Hydrologist (Medical): No    Lack of Transportation (Non-Medical): No  Physical Activity: Sufficiently Active (10/27/2021)   Exercise Vital Sign    Days of Exercise per Week: 7 days    Minutes of Exercise per Session: 50 min  Stress: No Stress Concern Present (10/27/2021)   [place]    Feeling of Stress : Not at all  Social Connections: Moderately Isolated (10/27/2021)   Social Connection and Isolation Panel [NHANES]    Frequency of Communication with Friends and Family: More than three times a week    Frequency of Social Gatherings with Friends and  Family: More than three times a week    Attends Religious Services: More than 4 times per year    Active Member of [employer] or Organizations: No    Attends Archivist Meetings: Never    Marital Status: Widowed    Tobacco Counseling  Counseling given: Not Answered Tobacco comments: Quit I the 1980s   Clinical Intake:  Pre-visit preparation completed: Yes  Pain : No/denies pain  Nutritional Risks: None Diabetes: No  How often do you need to have someone help you when you read instructions, pamphlets, or other written materials from your doctor or pharmacy?: 1 - Never  Activities of Daily Living    10/29/2022    1:02 PM  In your present state of health, do you have any difficulty performing the following activities:  Hearing? 1  Comment wears hearing aids  Vision? 0  Difficulty concentrating or making decisions? 0  Walking or climbing stairs? 0  Dressing or bathing? 0  Doing errands, shopping? 0  Preparing Food and eating ? N  Using the Toilet? N  In the past six months, have you accidently leaked urine? N  Do you have problems with loss of bowel control? N  Managing your Medications? N  Managing your Finances? N  Housekeeping or managing your Housekeeping? N    Patient Care Team: Colon Branch, MD as PCP - General (Internal Medicine) Rozetta Nunnery, MD (Inactive) Harold Hedge, Darrick Grinder, MD (Allergy and Immunology) Marlou Sa Tonna Corner, MD as Consulting Physician (Orthopedic Surgery)  Indicate any recent Medical Services you may have received from other than [facility] providers in the past year (date may be approximate).     Assessment:   This is a routine wellness examination for Shelia.  Hearing/Vision screen No results found.  Dietary issues and exercise activities discussed: Current Exercise Habits: Home exercise routine, Type of exercise: walking;strength training/weights;Other - see comments (yard work), Time (Minutes): 30, Frequency (Times/Week): 7,  Weekly Exercise (Minutes/Week): 210, Intensity: Mild, Exercise limited by: None identified   Goals Addressed   None    Depression Screen    10/29/2022    1:02 PM 05/26/2022    8:04 AM 10/27/2021    1:37 PM 05/19/2021    1:13 PM 10/17/2019    8:15 AM 04/19/2019    2:16 PM 03/02/2018    2:52 PM  PHQ 2/9 Scores  PHQ - 2 Score 0 0 0 0 0 0 0    Fall Risk    10/29/2022    1:00 PM 05/26/2022    8:04 AM 10/27/2021    1:36 PM 05/19/2021    1:13 PM 10/17/2019    8:14 AM  [place] in the past year? 0 0 0 0 0  Number falls in past yr: 0 0 0 0 0  Injury with Fall? 0 0 0 0 0  Risk for fall due to : No Fall Risks      Follow up Falls evaluation completed Falls evaluation completed Falls prevention discussed Falls evaluation completed Education provided;Falls prevention discussed    FALL RISK PREVENTION PERTAINING TO THE HOME:  Any stairs in or around the home? No  Home free of loose throw rugs in walkways, pet beds, electrical cords, etc? Yes  Adequate lighting in your home to reduce risk of falls? Yes   ASSISTIVE DEVICES UTILIZED TO PREVENT FALLS:  Life alert? No  Use of a cane, walker or w/c? No  Grab bars in the bathroom? No  Shower chair or bench in shower? No  Elevated toilet seat or a handicapped toilet? Yes   TIMED UP AND GO:  Was the test performed?  No, audio visit .    Cognitive Function:        10/29/2022    1:06  PM  6CIT Screen  What Year? 0 points  What month? 0 points  What time? 0 points  Count back from 20 0 points  Months in reverse 0 points  Repeat phrase 0 points  Total Score 0 points    Immunizations Immunization History  Administered Date(s) Administered   Fluad Quad(high Dose 65+) 04/19/2019, 04/22/2020, 05/19/2021, 05/26/2022   Influenza Split 07/03/2014   Influenza, Seasonal, Injecte, Preservative Fre 04/22/2015   Influenza,inj,Quad PF,6+ Mos 05/22/2018   Influenza-Unspecified 04/09/2016, 05/07/2017   PNEUMOCOCCAL CONJUGATE-20  05/26/2022   Pneumococcal Conjugate-13 12/29/2013   Pneumococcal Polysaccharide-23 09/09/2011   Tdap 09/09/2011, 08/01/2021   Zoster Recombinat (Shingrix) 03/02/2018, 07/04/2018   Zoster, Live 01/08/2015    TDAP status: Up to date  Flu Vaccine status: Up to date  Pneumococcal vaccine status: Up to date  Covid-19 vaccine status: Declined, Education has been provided regarding the importance of this vaccine but patient still declined. Advised may receive this vaccine at local pharmacy or Health Dept.or vaccine clinic. Aware to provide a copy of the vaccination record if obtained from local pharmacy or Health Dept. Verbalized acceptance and understanding.  Qualifies for Shingles Vaccine? Yes   Zostavax completed Yes   Shingrix Completed?: Yes  Screening Tests Health Maintenance  Topic Date Due   Medicare Annual Wellness (AWV)  10/28/2022   COLONOSCOPY (Pts 45-66yrs Insurance coverage will need to be confirmed)  11/27/2026   DTaP/Tdap/Td (3 - Td or Tdap) 08/02/2031   Pneumonia Vaccine 45+ Years old  Completed   INFLUENZA VACCINE  Completed   Hepatitis C Screening  Completed   Zoster Vaccines- Shingrix  Completed   HPV VACCINES  Aged Out   COVID-19 Vaccine  Discontinued    Health Maintenance  Health Maintenance Due  Topic Date Due   Medicare Annual Wellness (AWV)  10/28/2022    Colorectal cancer screening: Type of screening: Colonoscopy. Completed 11/27/19. Repeat every 7 years  Lung Cancer Screening: (Low Dose CT Chest recommended if Age 68-80 years, 30 pack-year currently smoking OR have quit w/in 15years.) does not qualify.   Additional Screening:  Hepatitis C Screening: does qualify; Completed 02/18/17  Vision Screening: Recommended annual ophthalmology exams for early detection of glaucoma and other disorders of the eye. Is the patient up to date with their annual eye exam?  Yes  Who is the provider or what is the name of the office in which the patient attends annual  eye exams? [facility]. If pt is not established with a provider, would they like to be referred to a provider to establish care? No .   Dental Screening: Recommended annual dental exams for proper oral hygiene  Community Resource Referral / Chronic Care Management: CRR required this visit?  No   CCM required this visit?  No      Plan:     I have personally reviewed and noted the following in the patient's chart:   Medical and social history Use of alcohol, tobacco or illicit drugs  Current medications and supplements including opioid prescriptions. Patient is not currently taking opioid prescriptions. Functional ability and status Nutritional status Physical activity Advanced directives List of other physicians Hospitalizations, surgeries, and ER visits in previous 12 months Vitals Screenings to include cognitive, depression, and falls Referrals and appointments  In addition, I have reviewed and discussed with patient certain preventive protocols, quality metrics, and best practice recommendations. A written personalized care plan for preventive services as well as general preventive health recommendations were provided to patient.  Due to this being a telephonic visit, the after visit summary with patients personalized plan was offered to patient via mail or my-chart. Patient would like to access on my-chart.  Beatris Ship, [place]   10/29/2022   Nurse Notes: None

## 2022-11-06 ENCOUNTER — Ambulatory Visit (INDEPENDENT_AMBULATORY_CARE_PROVIDER_SITE_OTHER): Payer: Medicare HMO | Admitting: Orthopedic Surgery

## 2022-11-06 DIAGNOSIS — G5602 Carpal tunnel syndrome, left upper limb: Secondary | ICD-10-CM

## 2022-11-07 ENCOUNTER — Encounter: Payer: Self-pay | Admitting: Orthopedic Surgery

## 2022-11-07 NOTE — Progress Notes (Signed)
Office Visit Note   Patient: Brett Berry           Date of Birth: [birth year]           MRN: [id number] Visit Date: 11/06/2022 Requested by: Wanda Plump, MD [address] [place],  [place] [address] PCP: Wanda Plump, MD  Subjective: Chief Complaint  Patient presents with   Other     Left hand numbness and tingling    HPI: Brett Berry is a 72 y.o. male who presents to the office reporting left hand numbness and tingling on and off for the past 1 and half years.  Symptoms are getting worse.  Reports numbness in the thumb and index finger.  Describes decreased strength.  Difficult for him to pick things up.  He is dropping things.  Describes some occasional neck pain but the pain also wakes him from sleep at night.  He is right-hand dominant.  Has a history of right carpal tunnel release which she did well with.  Does not do physical work.  Nerve study from 2019 shows moderate left median nerve entrapment at the wrist..                ROS: All systems reviewed are negative as they relate to the chief complaint within the history of present illness.  Patient denies fevers or chills.  Assessment & Plan: Visit Diagnoses:  1. Carpal tunnel syndrome, left upper limb     Plan: Impression is left carpal tunnel syndrome refractory to nonoperative management of 1 and half years duration.  He has classic findings.  Neck range of motion is reasonable and this does not look like clear-cut radiculopathy.  Plan is left carpal tunnel release.  The risk and benefits were discussed including not limited to infection nerve vessel damage hand stiffness as well as decreased grip strength for a period of several months.  Patient understands risk and benefits and wishes to proceed.  All questions answered  Follow-Up Instructions: No follow-ups on file.   Orders:  No orders of the defined types were placed in this encounter.  No orders of the defined types were placed in this  encounter.     Procedures: No procedures performed   Clinical Data: No additional findings.  Objective: Vital Signs: There were no vitals taken for this visit.  Physical Exam:  Constitutional: Patient appears well-developed HEENT:  Head: Normocephalic Eyes:EOM are normal Neck: Normal range of motion Cardiovascular: Normal rate Pulmonary/chest: Effort normal Neurologic: Patient is alert Skin: Skin is warm Psychiatric: Patient has normal mood and affect  Ortho Exam: Ortho exam demonstrates no abductor pollicis brevis wasting on the left.  He has good grip strength.  EPL FPL interosseous intact.  Radial pulse intact.  Positive carpal tunnel compression testing.  Negative Tinel's at the elbow with no subluxation of the ulnar nerve.  Does have some paresthesias on the palmar surface of the hand in the median distribution compared to the dorsal surface.  Neck range of motion full.  No paresthesias in the upper arm on the left-hand side.  Specialty Comments:  No specialty comments available.  Imaging: No results found.   PMFS History: Patient Active Problem List   Diagnosis Date Noted   Unilateral primary osteoarthritis, left knee    Unilateral primary osteoarthritis, right knee    Arthritis of knee 08/30/2018   GERD with esophagitis 08/19/2016   PCP NOTES >>>>>>>>>>>>>>>>>> 02/18/2016   DJD (knees, celebrex, ortho  Dr Lajoyce Corners) 01/08/2015   Erectile dysfunction 07/30/2014   Annual physical exam 12/29/2013   Non-functioning tympanostomy tube 09/10/2011   Dysphagia 09/10/2011   Chronic sinusitis 02/05/2010   HEADACHE 02/05/2010   Anxiety -insomnia 01/04/2009   Hyperlipidemia 01/04/2009   ALLERGIC RHINITIS 10/27/2007   Asthma 10/27/2007   Past Medical History:  Diagnosis Date   Allergic rhinitis    Asthma    Bilateral knee pain 2016   x-ray showed medial compartment arthritis, worse on right than left   Erectile dysfunction 07/30/2014   GERD (gastroesophageal reflux  disease)    Hearing difficulty    has aids   History of kidney stones    Hyperlipidemia    intolerant to zocor   Pneumonia    Sinusitis, chronic     Family History  Problem Relation Age of Onset   COPD Mother    Breast cancer Mother    Colon polyps Mother    Alzheimer's disease Father    Lung cancer Father        smoker   Prostate cancer Father 32       died age 51   Heart disease Father        age 13   Colon cancer Neg Hx    Esophageal cancer Neg Hx    Rectal cancer Neg Hx    Stomach cancer Neg Hx     Past Surgical History:  Procedure Laterality Date   CARPAL TUNNEL RELEASE Right 06/2018   CATARACT EXTRACTION Right 2016   COLONOSCOPY  09/25/2011   EYE SURGERY  10-15-2014   Cataract surgery, right eye   JOINT REPLACEMENT  08/30/2018   MASTOIDECTOMY     right    POLYPECTOMY     right hand surg     for infection in '07   SINUS ENDO WITH FUSION     TOE SURGERY     Left Great toe   TOTAL KNEE ARTHROPLASTY Bilateral 08/30/2018   TOTAL KNEE ARTHROPLASTY Bilateral 08/30/2018   Procedure: BILATERAL TOTAL KNEE ARTHROPLASTY;  Surgeon: Cammy Copa, MD;  Location: [facility] OR;  Service: Orthopedics;  Laterality: Bilateral;   Social History   Occupational History   Occupation: Scientific laboratory technician: [employer]  Tobacco Use   Smoking status: Former    Types: Cigarettes    Quit date: 09/08/1973    Years since quitting: 49.1   Smokeless tobacco: Never   Tobacco comments:    Quit I the 1980s  Vaping Use   Vaping Use: Never used  Substance and Sexual Activity   Alcohol use: Yes    Alcohol/week: 3.0 standard drinks of alcohol    Types: 3 Cans of beer per week   Drug use: No   Sexual activity: Yes    Partners: Female

## 2022-11-22 ENCOUNTER — Other Ambulatory Visit: Payer: Self-pay | Admitting: Internal Medicine

## 2022-11-23 ENCOUNTER — Other Ambulatory Visit: Payer: Self-pay | Admitting: Surgical

## 2022-11-23 DIAGNOSIS — G5602 Carpal tunnel syndrome, left upper limb: Secondary | ICD-10-CM | POA: Diagnosis not present

## 2022-11-23 MED ORDER — TRAMADOL HCL 50 MG PO TABS: 50.0000 mg | ORAL_TABLET | Freq: Four times a day (QID) | ORAL | 0 refills | Status: AC | PRN

## 2022-12-02 ENCOUNTER — Ambulatory Visit (INDEPENDENT_AMBULATORY_CARE_PROVIDER_SITE_OTHER): Payer: Medicare HMO | Admitting: Surgical

## 2022-12-02 DIAGNOSIS — G5602 Carpal tunnel syndrome, left upper limb: Secondary | ICD-10-CM

## 2022-12-06 ENCOUNTER — Encounter: Payer: Self-pay | Admitting: Surgical

## 2022-12-06 NOTE — Progress Notes (Signed)
Post-Op Visit Note   Patient: Brett Berry           Date of Birth: 01/02/1951           MRN: [id number] Visit Date: 12/02/2022 PCP: Wanda Plump, MD   Assessment & Plan:  Chief Complaint:  Chief Complaint  Patient presents with   Left Hand - Routine Post Op    L CTR (surgery date 11-23-22)   Visit Diagnoses:  1. Carpal tunnel syndrome, left upper limb     Plan: Patient is a 72 year old male who presents s/p left carpal tunnel release on 11/23/2022.  Patient states that he is doing well with minimal pain.  He is able to make a fist.  He has a little bit of tingling in the thumb and middle fingers in the tips of the digits but has not really had to take any pain medicine following surgery.  On exam, patient has intact EPL, FPL, finger abduction, grip strength that is excellent.  Able to make full fist.  Incision looks to be healing well without evidence of infection or dehiscence.  He has sutures intact with no expressible drainage.  Palpable radial pulse of the operative extremity.  Plan is to continue with no lifting with the operative hand.  Avoid submersion underwater.  Follow-up next Monday for clinical recheck and suture removal.  Follow-Up Instructions: No follow-ups on file.   Orders:  No orders of the defined types were placed in this encounter.  No orders of the defined types were placed in this encounter.   Imaging: No results found.  PMFS History: Patient Active Problem List   Diagnosis Date Noted   Unilateral primary osteoarthritis, left knee    Unilateral primary osteoarthritis, right knee    Arthritis of knee 08/30/2018   GERD with esophagitis 08/19/2016   PCP NOTES >>>>>>>>>>>>>>>>>> 02/18/2016   DJD (knees, celebrex, ortho Dr Lajoyce Corners) 01/08/2015   Erectile dysfunction 07/30/2014   Annual physical exam 12/29/2013   Non-functioning tympanostomy tube 09/10/2011   Dysphagia 09/10/2011   Chronic sinusitis 02/05/2010   HEADACHE 02/05/2010   Anxiety -insomnia  01/04/2009   Hyperlipidemia 01/04/2009   ALLERGIC RHINITIS 10/27/2007   Asthma 10/27/2007   Past Medical History:  Diagnosis Date   Allergic rhinitis    Asthma    Bilateral knee pain 2016   x-ray showed medial compartment arthritis, worse on right than left   Erectile dysfunction 07/30/2014   GERD (gastroesophageal reflux disease)    Hearing difficulty    has aids   History of kidney stones    Hyperlipidemia    intolerant to zocor   Pneumonia    Sinusitis, chronic     Family History  Problem Relation Age of Onset   COPD Mother    Breast cancer Mother    Colon polyps Mother    Alzheimer's disease Father    Lung cancer Father        smoker   Prostate cancer Father 28       died age 94   Heart disease Father        age 32   Colon cancer Neg Hx    Esophageal cancer Neg Hx    Rectal cancer Neg Hx    Stomach cancer Neg Hx     Past Surgical History:  Procedure Laterality Date   CARPAL TUNNEL RELEASE Right 06/2018   CATARACT EXTRACTION Right 2016   COLONOSCOPY  09/25/2011   EYE SURGERY  10-15-2014   Cataract  surgery, right eye   JOINT REPLACEMENT  08/30/2018   MASTOIDECTOMY     right    POLYPECTOMY     right hand surg     for infection in '07   SINUS ENDO WITH FUSION     TOE SURGERY     Left Great toe   TOTAL KNEE ARTHROPLASTY Bilateral 08/30/2018   TOTAL KNEE ARTHROPLASTY Bilateral 08/30/2018   Procedure: BILATERAL TOTAL KNEE ARTHROPLASTY;  Surgeon: Cammy Copa, MD;  Location: [facility] OR;  Service: Orthopedics;  Laterality: Bilateral;   Social History   Occupational History   Occupation: Scientific laboratory technician: [employer]  Tobacco Use   Smoking status: Former    Types: Cigarettes    Quit date: 09/08/1973    Years since quitting: 49.2   Smokeless tobacco: Never   Tobacco comments:    Quit I the 1980s  Vaping Use   Vaping Use: Never used  Substance and Sexual Activity   Alcohol use: Yes    Alcohol/week: 3.0 standard drinks of  alcohol    Types: 3 Cans of beer per week   Drug use: No   Sexual activity: Yes    Partners: Female

## 2022-12-07 ENCOUNTER — Encounter: Payer: Self-pay | Admitting: Surgical

## 2022-12-07 ENCOUNTER — Ambulatory Visit (INDEPENDENT_AMBULATORY_CARE_PROVIDER_SITE_OTHER): Payer: Medicare HMO | Admitting: Surgical

## 2022-12-07 DIAGNOSIS — G5602 Carpal tunnel syndrome, left upper limb: Secondary | ICD-10-CM

## 2022-12-07 MED ORDER — DOXYCYCLINE HYCLATE 100 MG PO CAPS: 100.0000 mg | ORAL_CAPSULE | Freq: Two times a day (BID) | ORAL | 0 refills | Status: DC

## 2022-12-07 NOTE — Progress Notes (Signed)
Post-Op Visit Note   Patient: Brett Berry           Date of Birth: Sep 04, 1950           MRN: [id number] Visit Date: 12/07/2022 PCP: Wanda Plump, MD   Assessment & Plan:  Chief Complaint:  Chief Complaint  Patient presents with   Left Hand - Follow-up    L CTR (surgery date 11-23-22)   Visit Diagnoses: No diagnosis found.  Plan: Patient is a 72 year old male who presents s/p left carpal tunnel release on 11/23/2022.  He is now 2 weeks out.  He has been doing well and numbness is improving.  Does have occasional tingling in the tips of his index finger primarily that worsens as the day goes on.  Does state that he had a little bit of increase in his pain on Saturday and has noticed some redness around the incision.  No fevers or chills.  He has not noticed any active drainage.  On exam, patient has incision that looks to be healing with sutures intact.  There is some erythema that is surrounding the incision that somewhat improves with elevation of the extremity above the level of his heart.  He has some expressible drainage.  Intact EPL, FPL, finger abduction, grip strength.  2+ radial pulse of the operative extremity.  Plan is suture removal today.  We will do dry dressing changes every day with large Band-Aids and start doxycycline.  See him back in 1 week for incision recheck.  He was given instructions to reach back out to our office if he notices any signs of worsening infection such as fevers, chills, increasing pain, increasing redness, etc.  Follow-Up Instructions: No follow-ups on file.   Orders:  No orders of the defined types were placed in this encounter.  Meds ordered this encounter  Medications   doxycycline (VIBRAMYCIN) 100 MG capsule    Sig: Take 1 capsule (100 mg total) by mouth 2 (two) times daily.    Dispense:  20 capsule    Refill:  0    Imaging: No results found.  PMFS History: Patient Active Problem List   Diagnosis Date Noted   Unilateral primary  osteoarthritis, left knee    Unilateral primary osteoarthritis, right knee    Arthritis of knee 08/30/2018   GERD with esophagitis 08/19/2016   PCP NOTES >>>>>>>>>>>>>>>>>> 02/18/2016   DJD (knees, celebrex, ortho Dr Lajoyce Corners) 01/08/2015   Erectile dysfunction 07/30/2014   Annual physical exam 12/29/2013   Non-functioning tympanostomy tube 09/10/2011   Dysphagia 09/10/2011   Chronic sinusitis 02/05/2010   HEADACHE 02/05/2010   Anxiety -insomnia 01/04/2009   Hyperlipidemia 01/04/2009   ALLERGIC RHINITIS 10/27/2007   Asthma 10/27/2007   Past Medical History:  Diagnosis Date   Allergic rhinitis    Asthma    Bilateral knee pain 2016   x-ray showed medial compartment arthritis, worse on right than left   Erectile dysfunction 07/30/2014   GERD (gastroesophageal reflux disease)    Hearing difficulty    has aids   History of kidney stones    Hyperlipidemia    intolerant to zocor   Pneumonia    Sinusitis, chronic     Family History  Problem Relation Age of Onset   COPD Mother    Breast cancer Mother    Colon polyps Mother    Alzheimer's disease Father    Lung cancer Father        smoker   Prostate cancer Father 40  died age 58   Heart disease Father        age 35   Colon cancer Neg Hx    Esophageal cancer Neg Hx    Rectal cancer Neg Hx    Stomach cancer Neg Hx     Past Surgical History:  Procedure Laterality Date   CARPAL TUNNEL RELEASE Right 06/2018   CATARACT EXTRACTION Right 2016   COLONOSCOPY  09/25/2011   EYE SURGERY  10-15-2014   Cataract surgery, right eye   JOINT REPLACEMENT  08/30/2018   MASTOIDECTOMY     right    POLYPECTOMY     right hand surg     for infection in '07   SINUS ENDO WITH FUSION     TOE SURGERY     Left Great toe   TOTAL KNEE ARTHROPLASTY Bilateral 08/30/2018   TOTAL KNEE ARTHROPLASTY Bilateral 08/30/2018   Procedure: BILATERAL TOTAL KNEE ARTHROPLASTY;  Surgeon: Cammy Copa, MD;  Location: [facility] OR;  Service: Orthopedics;   Laterality: Bilateral;   Social History   Occupational History   Occupation: Scientific laboratory technician: [employer]  Tobacco Use   Smoking status: Former    Types: Cigarettes    Quit date: 09/08/1973    Years since quitting: 49.2   Smokeless tobacco: Never   Tobacco comments:    Quit I the 1980s  Vaping Use   Vaping Use: Never used  Substance and Sexual Activity   Alcohol use: Yes    Alcohol/week: 3.0 standard drinks of alcohol    Types: 3 Cans of beer per week   Drug use: No   Sexual activity: Yes    Partners: Female

## 2022-12-14 ENCOUNTER — Encounter: Payer: Self-pay | Admitting: Surgical

## 2022-12-14 ENCOUNTER — Ambulatory Visit (INDEPENDENT_AMBULATORY_CARE_PROVIDER_SITE_OTHER): Payer: Medicare HMO | Admitting: Surgical

## 2022-12-14 DIAGNOSIS — G5602 Carpal tunnel syndrome, left upper limb: Secondary | ICD-10-CM

## 2022-12-14 NOTE — Progress Notes (Signed)
Post-Op Visit Note   Patient: Brett Berry           Date of Birth: [birth year]           MRN: [id number] Visit Date: 12/14/2022 PCP: Wanda Plump, MD   Assessment & Plan:  Chief Complaint:  Chief Complaint  Patient presents with   Left Hand - Follow-up       L CTR (surgery date 11-23-22)     Visit Diagnoses:  1. Carpal tunnel syndrome, left upper limb     Plan: Patient is a 72 year old male who presents s/p left carpal tunnel release on 11/23/2022.  He had some redness around the incision and some drainage from the incision at his last visit.  He has been taking doxycycline since last visit as directed.  Has not finished his course yet.  He still has a couple days left.  States he has no fevers or chills and has been using dry bandages over top of the incision with minimal drainage at the base of the incision.  Surrounding erythema is much improved.  Continues to have intact EPL, FPL, grip strength, finger abduction.  Palpable radial pulse of the operative extremity.  Really does not have any pain.  Will plan on finishing out the course of antibiotics.  Continue with dry bandages.  Follow-up in 10 days for clinical recheck to see how this is responding once he discontinues antibiotics.  Follow-Up Instructions: No follow-ups on file.   Orders:  No orders of the defined types were placed in this encounter.  No orders of the defined types were placed in this encounter.   Imaging: No results found.  PMFS History: Patient Active Problem List   Diagnosis Date Noted   Unilateral primary osteoarthritis, left knee    Unilateral primary osteoarthritis, right knee    Arthritis of knee 08/30/2018   GERD with esophagitis 08/19/2016   PCP NOTES >>>>>>>>>>>>>>>>>> 02/18/2016   DJD (knees, celebrex, ortho Dr Lajoyce Corners) 01/08/2015   Erectile dysfunction 07/30/2014   Annual physical exam 12/29/2013   Non-functioning tympanostomy tube 09/10/2011   Dysphagia 09/10/2011   Chronic sinusitis  02/05/2010   HEADACHE 02/05/2010   Anxiety -insomnia 01/04/2009   Hyperlipidemia 01/04/2009   ALLERGIC RHINITIS 10/27/2007   Asthma 10/27/2007   Past Medical History:  Diagnosis Date   Allergic rhinitis    Asthma    Bilateral knee pain 2016   x-ray showed medial compartment arthritis, worse on right than left   Erectile dysfunction 07/30/2014   GERD (gastroesophageal reflux disease)    Hearing difficulty    has aids   History of kidney stones    Hyperlipidemia    intolerant to zocor   Pneumonia    Sinusitis, chronic     Family History  Problem Relation Age of Onset   COPD Mother    Breast cancer Mother    Colon polyps Mother    Alzheimer's disease Father    Lung cancer Father        smoker   Prostate cancer Father 46       died age 2   Heart disease Father        age 57   Colon cancer Neg Hx    Esophageal cancer Neg Hx    Rectal cancer Neg Hx    Stomach cancer Neg Hx     Past Surgical History:  Procedure Laterality Date   CARPAL TUNNEL RELEASE Right 06/2018   CATARACT EXTRACTION Right 2016  COLONOSCOPY  09/25/2011   EYE SURGERY  10-15-2014   Cataract surgery, right eye   JOINT REPLACEMENT  08/30/2018   MASTOIDECTOMY     right    POLYPECTOMY     right hand surg     for infection in '07   SINUS ENDO WITH FUSION     TOE SURGERY     Left Great toe   TOTAL KNEE ARTHROPLASTY Bilateral 08/30/2018   TOTAL KNEE ARTHROPLASTY Bilateral 08/30/2018   Procedure: BILATERAL TOTAL KNEE ARTHROPLASTY;  Surgeon: Cammy Copa, MD;  Location: [facility] OR;  Service: Orthopedics;  Laterality: Bilateral;   Social History   Occupational History   Occupation: Scientific laboratory technician: [employer]  Tobacco Use   Smoking status: Former    Types: Cigarettes    Quit date: 09/08/1973    Years since quitting: 49.2   Smokeless tobacco: Never   Tobacco comments:    Quit I the 1980s  Vaping Use   Vaping Use: Never used  Substance and Sexual Activity    Alcohol use: Yes    Alcohol/week: 3.0 standard drinks of alcohol    Types: 3 Cans of beer per week   Drug use: No   Sexual activity: Yes    Partners: Female

## 2022-12-16 ENCOUNTER — Other Ambulatory Visit: Payer: Self-pay | Admitting: Internal Medicine

## 2022-12-23 ENCOUNTER — Encounter: Payer: Self-pay | Admitting: Orthopedic Surgery

## 2022-12-23 ENCOUNTER — Ambulatory Visit (INDEPENDENT_AMBULATORY_CARE_PROVIDER_SITE_OTHER): Payer: Medicare HMO | Admitting: Orthopedic Surgery

## 2022-12-23 DIAGNOSIS — G5602 Carpal tunnel syndrome, left upper limb: Secondary | ICD-10-CM

## 2022-12-23 NOTE — Progress Notes (Unsigned)
Post-Op Visit Note   Patient: Brett Berry           Date of Birth: 1950-11-17           MRN: [id number] Visit Date: 12/23/2022 PCP: Wanda Plump, MD   Assessment & Plan:  Chief Complaint:  Chief Complaint  Patient presents with   Left Wrist - Follow-up    Left carpal tunnel release   Visit Diagnoses: No diagnosis found.  Plan: Doreatha Martin is now about 5 weeks out from left carpal tunnel release.  This was done 11/23/2022.  On examination the incision has healed nicely.  Still 1-2 areas of very small area of granulation tissue but no tissue fluctuance erythema or induration.  He has very good grip strength.  He states that the numbness and tingling is almost gone in the fingertips.  Plan at this time is relatively measured activity for the next 4 weeks then activity as tolerated.  Follow-up with [place] as needed.  Follow-Up Instructions: No follow-ups on file.   Orders:  No orders of the defined types were placed in this encounter.  No orders of the defined types were placed in this encounter.   Imaging: No results found.  PMFS History: Patient Active Problem List   Diagnosis Date Noted   Unilateral primary osteoarthritis, left knee    Unilateral primary osteoarthritis, right knee    Arthritis of knee 08/30/2018   GERD with esophagitis 08/19/2016   PCP NOTES >>>>>>>>>>>>>>>>>> 02/18/2016   DJD (knees, celebrex, ortho Dr Lajoyce Corners) 01/08/2015   Erectile dysfunction 07/30/2014   Annual physical exam 12/29/2013   Non-functioning tympanostomy tube 09/10/2011   Dysphagia 09/10/2011   Chronic sinusitis 02/05/2010   HEADACHE 02/05/2010   Anxiety -insomnia 01/04/2009   Hyperlipidemia 01/04/2009   ALLERGIC RHINITIS 10/27/2007   Asthma 10/27/2007   Past Medical History:  Diagnosis Date   Allergic rhinitis    Asthma    Bilateral knee pain 2016   x-ray showed medial compartment arthritis, worse on right than left   Erectile dysfunction 07/30/2014   GERD (gastroesophageal reflux  disease)    Hearing difficulty    has aids   History of kidney stones    Hyperlipidemia    intolerant to zocor   Pneumonia    Sinusitis, chronic     Family History  Problem Relation Age of Onset   COPD Mother    Breast cancer Mother    Colon polyps Mother    Alzheimer's disease Father    Lung cancer Father        smoker   Prostate cancer Father 27       died age 68   Heart disease Father        age 18   Colon cancer Neg Hx    Esophageal cancer Neg Hx    Rectal cancer Neg Hx    Stomach cancer Neg Hx     Past Surgical History:  Procedure Laterality Date   CARPAL TUNNEL RELEASE Right 06/2018   CATARACT EXTRACTION Right 2016   COLONOSCOPY  09/25/2011   EYE SURGERY  10-15-2014   Cataract surgery, right eye   JOINT REPLACEMENT  08/30/2018   MASTOIDECTOMY     right    POLYPECTOMY     right hand surg     for infection in '07   SINUS ENDO WITH FUSION     TOE SURGERY     Left Great toe   TOTAL KNEE ARTHROPLASTY Bilateral 08/30/2018   TOTAL  KNEE ARTHROPLASTY Bilateral 08/30/2018   Procedure: BILATERAL TOTAL KNEE ARTHROPLASTY;  Surgeon: Cammy Copa, MD;  Location: [facility] OR;  Service: Orthopedics;  Laterality: Bilateral;   Social History   Occupational History   Occupation: Scientific laboratory technician: [employer]  Tobacco Use   Smoking status: Former    Types: Cigarettes    Quit date: 09/08/1973    Years since quitting: 49.3   Smokeless tobacco: Never   Tobacco comments:    Quit I the 1980s  Vaping Use   Vaping Use: Never used  Substance and Sexual Activity   Alcohol use: Yes    Alcohol/week: 3.0 standard drinks of alcohol    Types: 3 Cans of beer per week   Drug use: No   Sexual activity: Yes    Partners: Female

## 2023-02-24 ENCOUNTER — Telehealth: Payer: Self-pay | Admitting: Internal Medicine

## 2023-02-24 DIAGNOSIS — J310 Chronic rhinitis: Secondary | ICD-10-CM

## 2023-02-24 MED ORDER — BUDESONIDE-FORMOTEROL FUMARATE 160-4.5 MCG/ACT IN AERO
2.0000 | INHALATION_SPRAY | Freq: Two times a day (BID) | RESPIRATORY_TRACT | 1 refills | Status: DC
Start: 2023-02-24 — End: 2023-05-28

## 2023-02-24 NOTE — Telephone Encounter (Signed)
Rx sent 

## 2023-02-24 NOTE — Telephone Encounter (Signed)
Prescription Request  02/24/2023  Is this a "Controlled Substance" medicine? No  LOV: 10/02/2022  What is the name of the medication or equipment?   budesonide-formoterol [facility]) 160-4.5 MCG/ACT inhaler [[id number]]   Have you contacted your pharmacy to request a refill? Yes   Which pharmacy would you like this sent to?  [id number] [place], [place] - [address] [place] [address] Phone: [phone] Fax: [phone]    Patient notified that their request is being sent to the clinical staff for review and that they should receive a response within 2 business days.   Please advise at Mobile [phone] (mobile)

## 2023-02-24 NOTE — Addendum Note (Signed)
Addended byConrad [place] D on: 02/24/2023 10:07 AM   Modules accepted: Orders

## 2023-03-01 DIAGNOSIS — D492 Neoplasm of unspecified behavior of bone, soft tissue, and skin: Secondary | ICD-10-CM | POA: Diagnosis not present

## 2023-03-01 DIAGNOSIS — H43813 Vitreous degeneration, bilateral: Secondary | ICD-10-CM | POA: Diagnosis not present

## 2023-03-01 DIAGNOSIS — Z961 Presence of intraocular lens: Secondary | ICD-10-CM | POA: Diagnosis not present

## 2023-03-01 DIAGNOSIS — H40013 Open angle with borderline findings, low risk, bilateral: Secondary | ICD-10-CM | POA: Diagnosis not present

## 2023-04-10 ENCOUNTER — Other Ambulatory Visit: Payer: Self-pay | Admitting: Family

## 2023-05-28 ENCOUNTER — Encounter: Payer: Self-pay | Admitting: Internal Medicine

## 2023-05-28 ENCOUNTER — Ambulatory Visit (INDEPENDENT_AMBULATORY_CARE_PROVIDER_SITE_OTHER): Payer: Medicare HMO | Admitting: Internal Medicine

## 2023-05-28 VITALS — BP 126/70 | HR 59 | Temp 97.4°F | Resp 16 | Ht 70.0 in | Wt 179.5 lb

## 2023-05-28 DIAGNOSIS — E785 Hyperlipidemia, unspecified: Secondary | ICD-10-CM | POA: Diagnosis not present

## 2023-05-28 DIAGNOSIS — Z125 Encounter for screening for malignant neoplasm of prostate: Secondary | ICD-10-CM

## 2023-05-28 DIAGNOSIS — Z0000 Encounter for general adult medical examination without abnormal findings: Secondary | ICD-10-CM | POA: Diagnosis not present

## 2023-05-28 DIAGNOSIS — R739 Hyperglycemia, unspecified: Secondary | ICD-10-CM

## 2023-05-28 DIAGNOSIS — Z23 Encounter for immunization: Secondary | ICD-10-CM

## 2023-05-28 DIAGNOSIS — J310 Chronic rhinitis: Secondary | ICD-10-CM | POA: Diagnosis not present

## 2023-05-28 LAB — BASIC METABOLIC PANEL
BUN: 21 mg/dL (ref 6–23)
CO2: 29 meq/L (ref 19–32)
Calcium: 9.4 mg/dL (ref 8.4–10.5)
Chloride: 102 meq/L (ref 96–112)
Creatinine, Ser: 1.19 mg/dL (ref 0.40–1.50)
GFR: 61.88 mL/min (ref >60.00)
Glucose, Bld: 94 mg/dL (ref 70–99)
Potassium: 4.6 meq/L (ref 3.5–5.1)
Sodium: 139 meq/L (ref 135–145)

## 2023-05-28 LAB — LIPID PANEL
Cholesterol: 165 mg/dL (ref 0–200)
HDL: 51.5 mg/dL (ref >39.00)
LDL Cholesterol: 101 mg/dL — ABNORMAL HIGH (ref 0–99)
NonHDL: 113.17
Total CHOL/HDL Ratio: 3
Triglycerides: 62 mg/dL (ref 0.0–149.0)
VLDL: 12.4 mg/dL (ref 0.0–40.0)

## 2023-05-28 LAB — ALT: ALT: 19 U/L (ref 0–53)

## 2023-05-28 LAB — AST: AST: 22 U/L (ref 0–37)

## 2023-05-28 LAB — PSA: PSA: 2.25 ng/mL (ref 0.10–4.00)

## 2023-05-28 MED ORDER — CELECOXIB 100 MG PO CAPS: 100.0000 mg | ORAL_CAPSULE | Freq: Two times a day (BID) | ORAL | 1 refills | Status: DC | PRN

## 2023-05-28 MED ORDER — ESOMEPRAZOLE MAGNESIUM 40 MG PO CPDR: 40.0000 mg | DELAYED_RELEASE_CAPSULE | Freq: Two times a day (BID) | ORAL | 1 refills | Status: DC

## 2023-05-28 MED ORDER — ATORVASTATIN CALCIUM 10 MG PO TABS: 10.0000 mg | ORAL_TABLET | Freq: Every day | ORAL | 1 refills | Status: DC

## 2023-05-28 MED ORDER — BUDESONIDE-FORMOTEROL FUMARATE 160-4.5 MCG/ACT IN AERO: 2.0000 | INHALATION_SPRAY | Freq: Two times a day (BID) | RESPIRATORY_TRACT | 1 refills | Status: DC

## 2023-05-28 NOTE — Assessment & Plan Note (Signed)
Here for CPX Hyperlipidemia: On atorvastatin, checking labs. Asthma, on Symbicort, hardly ever uses albuterol GERD: Symptoms controlled on Nexium. Multiple refills today RTC 6 months

## 2023-05-28 NOTE — Assessment & Plan Note (Signed)
Here for CPX - Td 2022 - PNM shot 2013; PNM 13: 2015; PNM 20: 2023 -  zostavax 2016;  S/p  shingrex x 2 2019 -RSV D/W patient - Covid vaccine : Declined consistently   - flu shot today -[facility]- 09/25/11-adenomatous polyps and diverticulosis; C-scope 11-2019, next 2028 per GI letter  -Prostate cancer screening: No symptoms, check PSA. - Labs: BMP AST ALT FLP PSA. -Doing well, watching his diet, + weight loss. -Healthcare POA: Information provided, he has been thinking about it.

## 2023-05-28 NOTE — Progress Notes (Signed)
Subjective:    Patient ID: Brett Berry, male    DOB: 17-Oct-1950, 72 y.o.   MRN: [id number]  DOS:  05/28/2023 Type of visit - description: cpx  In general feeling well.  Has no major concerns. He is watching his diet more closely, + weight loss. Denies chest pain or difficulty breathing.   Review of Systems  Other than above, a 14 point review of systems is negative     Past Medical History:  Diagnosis Date   Allergic rhinitis    Asthma    Bilateral knee pain 2016   x-ray showed medial compartment arthritis, worse on right than left   Erectile dysfunction 07/30/2014   GERD (gastroesophageal reflux disease)    Hearing difficulty    has aids   History of kidney stones    Hyperlipidemia    intolerant to zocor   Pneumonia    Sinusitis, chronic     Past Surgical History:  Procedure Laterality Date   CARPAL TUNNEL RELEASE Right 06/2018   CATARACT EXTRACTION Right 2016   COLONOSCOPY  09/25/2011   EYE SURGERY  10-15-2014   Cataract surgery, right eye   JOINT REPLACEMENT  08/30/2018   MASTOIDECTOMY     right    POLYPECTOMY     right hand surg     for infection in '07   SINUS ENDO WITH FUSION     TOE SURGERY     Left Great toe   TOTAL KNEE ARTHROPLASTY Bilateral 08/30/2018   TOTAL KNEE ARTHROPLASTY Bilateral 08/30/2018   Procedure: BILATERAL TOTAL KNEE ARTHROPLASTY;  Surgeon: Cammy Copa, MD;  Location: [facility] OR;  Service: Orthopedics;  Laterality: Bilateral;   VITRECTOMY AND CATARACT Bilateral 09/2022   Social History   Socioeconomic History   Marital status: Married    Spouse name: Not on file   Number of children: 1   Years of education: Not on file   Highest education level: Not on file  Occupational History   Occupation: Chief Technology Officer dept    Employer: [employer]  Tobacco Use   Smoking status: Former    Current packs/day: 0.00    Types: Cigarettes    Quit date: 09/08/1973    Years since quitting: 49.7   Smokeless tobacco: Never    Tobacco comments:    Quit I the 1980s  Vaping Use   Vaping status: Never Used  Substance and Sexual Activity   Alcohol use: Yes    Alcohol/week: 3.0 standard drinks of alcohol    Types: 3 Cans of beer per week   Drug use: No   Sexual activity: Yes    Partners: Female  Other Topics Concern   Not on file  Social History Narrative   HSG. Married '76- 6 years divorced ; '85. 1 son- '86.    Lost wife 12-2014   Remarried    Social Determinants of Health   Financial Resource Strain: Low Risk  (10/27/2021)   Overall Financial Resource Strain (CARDIA)    Difficulty of Paying Living Expenses: Not hard at all  Food Insecurity: No Food Insecurity (10/29/2022)   Hunger Vital Sign    Worried About Running Out of Food in the Last Year: Never true    Ran Out of Food in the Last Year: Never true  Transportation Needs: No Transportation Needs (10/29/2022)   PRAPARE - Administrator, Civil Service (Medical): No    Lack of Transportation (Non-Medical): No  Physical Activity: Sufficiently Active (10/27/2021)  Exercise Vital Sign    Days of Exercise per Week: 7 days    Minutes of Exercise per Session: 50 min  Stress: No Stress Concern Present (10/27/2021)   [employer] of Occupational Health - Occupational Stress Questionnaire    Feeling of Stress : Not at all  Social Connections: Moderately Isolated (10/27/2021)   Social Connection and Isolation Panel [NHANES]    Frequency of Communication with Friends and Family: More than three times a week    Frequency of Social Gatherings with Friends and Family: More than three times a week    Attends Religious Services: More than 4 times per year    Active Member of [employer] or Organizations: No    Attends Banker Meetings: Never    Marital Status: Widowed  Intimate Partner Violence: Not At Risk (10/29/2022)   Humiliation, Afraid, Rape, and Kick questionnaire    Fear of Current or Ex-Partner: No    Emotionally Abused: No     Physically Abused: No    Sexually Abused: No    Current Outpatient Medications  Medication Instructions   albuterol (VENTOLIN HFA) 108 (90 Base) MCG/ACT inhaler 2 puffs, Inhalation, Every 4 hours PRN   atorvastatin (LIPITOR) 10 mg, Oral, Daily at bedtime   budesonide-formoterol (SYMBICORT) 160-4.5 MCG/ACT inhaler 2 puffs, Inhalation, 2 times daily   celecoxib (CELEBREX) 100 mg, Oral, 2 times daily PRN   Coenzyme Q10 (CO Q 10 PO) 1 capsule, Oral, Daily   esomeprazole (NEXIUM) 40 mg, Oral, 2 times daily before meals   Fish Oil 1,000 mg, Oral, 2 times daily   fluticasone (FLONASE) 50 MCG/ACT nasal spray 2 sprays, Each Nare, Daily   magnesium oxide (MAG-OX) 400 mg, Oral, Daily   Multiple Vitamin (MULTIVITAMIN WITH MINERALS) TABS tablet 1 tablet, Oral, Daily   traMADol (ULTRAM) 50 mg, Oral, Every 6 hours PRN       Objective:   Physical Exam BP 126/70   Pulse (!) 59   Temp (!) 97.4 F (36.3 C) (Oral)   Resp 16   Ht 5\' 10"  (1.778 m)   Wt 179 lb 8 oz (81.4 kg)   SpO2 95%   BMI 25.76 kg/m  General: Well developed, NAD, BMI noted HEENT:  Normocephalic . Face symmetric, atraumatic Lungs:  CTA B Normal respiratory effort, no intercostal retractions, no accessory muscle use. Heart: RRR,  no murmur.  Abdomen:  Not distended, soft, non-tender. No rebound or rigidity.   Lower extremities: no pretibial edema bilaterally  Skin: Exposed areas without rash. Not pale. Not jaundice Neurologic:  alert & oriented X3.  Speech normal, gait appropriate for age and unassisted Strength symmetric and appropriate for age.  Psych: Cognition and judgment appear intact.  Cooperative with normal attention span and concentration.  Behavior appropriate. No anxious or depressed appearing.      Assessment   Assessment Hyperlipidemia ---intolerant to simvastatin Asthma GERD E.D. ENT: --Chronic sinusitis, Rhinitis --Chronic hoarseness --Chronic perforated TM (R) --HOH has aids DJD- knees  R>>L, wrists  , Dr Lajoyce Corners Bradycardia   PLAN: Here for CPX - Td 2022 - PNM shot 2013; PNM 13: 2015; PNM 20: 2023 -  zostavax 2016;  S/p  shingrex x 2 2019 -RSV D/W patient - Covid vaccine : Declined consistently   - flu shot today -[facility]- 09/25/11-adenomatous polyps and diverticulosis; C-scope 11-2019, next 2028 per GI letter  -Prostate cancer screening: No symptoms, check PSA. - Labs: BMP AST ALT FLP PSA. -Doing well, watching his diet, + weight loss. -  Healthcare POA: Information provided, he has been thinking about it. Hyperlipidemia: On atorvastatin, checking labs. Asthma, on Symbicort, hardly ever uses albuterol GERD: Symptoms controlled on Nexium. Multiple refills today RTC 6 months

## 2023-05-28 NOTE — Patient Instructions (Addendum)
Vaccines I recommend: RSV vaccine    GO TO THE LAB : Get the blood work     Next visit with me in 6 months for a routine checkup     Please schedule it at the front desk      "Health Care Power of attorney" ,  "Living will" (Advance care planning documents)  If you already have a living will or healthcare power of attorney, is recommended you bring the copy to be scanned in your chart.   The document will be available to all the doctors you see in the system.  Advance care planning is a process that supports adults in  understanding and sharing their preferences regarding future medical care.  The patient's preferences are recorded in documents called Advance Directives and the can be modified at any time while the patient is in full mental capacity.   If you don't have one, please consider create one.      More information at: [email or web address]

## 2023-10-24 ENCOUNTER — Other Ambulatory Visit: Payer: Self-pay

## 2023-10-24 ENCOUNTER — Emergency Department (HOSPITAL_BASED_OUTPATIENT_CLINIC_OR_DEPARTMENT_OTHER)

## 2023-10-24 ENCOUNTER — Encounter (HOSPITAL_BASED_OUTPATIENT_CLINIC_OR_DEPARTMENT_OTHER): Payer: Self-pay | Admitting: Emergency Medicine

## 2023-10-24 ENCOUNTER — Emergency Department (HOSPITAL_BASED_OUTPATIENT_CLINIC_OR_DEPARTMENT_OTHER)
Admission: EM | Admit: 2023-10-24 | Discharge: 2023-10-24 | Disposition: A | Discharge status: Home / Self Care | Attending: Emergency Medicine | Admitting: Emergency Medicine

## 2023-10-24 DIAGNOSIS — J45909 Unspecified asthma, uncomplicated: Secondary | ICD-10-CM | POA: Insufficient documentation

## 2023-10-24 DIAGNOSIS — R6883 Chills (without fever): Secondary | ICD-10-CM | POA: Diagnosis not present

## 2023-10-24 DIAGNOSIS — D72829 Elevated white blood cell count, unspecified: Secondary | ICD-10-CM | POA: Insufficient documentation

## 2023-10-24 DIAGNOSIS — B309 Viral conjunctivitis, unspecified: Secondary | ICD-10-CM | POA: Diagnosis not present

## 2023-10-24 DIAGNOSIS — H1032 Unspecified acute conjunctivitis, left eye: Secondary | ICD-10-CM | POA: Insufficient documentation

## 2023-10-24 DIAGNOSIS — R079 Chest pain, unspecified: Secondary | ICD-10-CM | POA: Diagnosis not present

## 2023-10-24 DIAGNOSIS — Z7951 Long term (current) use of inhaled steroids: Secondary | ICD-10-CM | POA: Insufficient documentation

## 2023-10-24 DIAGNOSIS — R059 Cough, unspecified: Secondary | ICD-10-CM | POA: Diagnosis not present

## 2023-10-24 DIAGNOSIS — J129 Viral pneumonia, unspecified: Secondary | ICD-10-CM | POA: Diagnosis not present

## 2023-10-24 DIAGNOSIS — J188 Other pneumonia, unspecified organism: Secondary | ICD-10-CM | POA: Insufficient documentation

## 2023-10-24 DIAGNOSIS — R0789 Other chest pain: Secondary | ICD-10-CM | POA: Diagnosis not present

## 2023-10-24 DIAGNOSIS — R918 Other nonspecific abnormal finding of lung field: Secondary | ICD-10-CM | POA: Diagnosis not present

## 2023-10-24 DIAGNOSIS — J189 Pneumonia, unspecified organism: Secondary | ICD-10-CM

## 2023-10-24 LAB — BASIC METABOLIC PANEL
Anion gap: 8 (ref 5–15)
BUN: 23 mg/dL (ref 8–23)
CO2: 25 mmol/L (ref 22–32)
Calcium: 8.6 mg/dL — ABNORMAL LOW (ref 8.9–10.3)
Chloride: 105 mmol/L (ref 98–111)
Creatinine, Ser: 1.1 mg/dL (ref 0.61–1.24)
GFR, Estimated: >60 mL/min (ref >60)
Glucose, Bld: 119 mg/dL — ABNORMAL HIGH (ref 70–99)
Potassium: 3.7 mmol/L (ref 3.5–5.1)
Sodium: 138 mmol/L (ref 135–145)

## 2023-10-24 LAB — CBC
HCT: 35.5 % — ABNORMAL LOW (ref 39.0–52.0)
Hemoglobin: 12.1 g/dL — ABNORMAL LOW (ref 13.0–17.0)
MCH: 32.7 pg (ref 26.0–34.0)
MCHC: 34.1 g/dL (ref 30.0–36.0)
MCV: 95.9 fL (ref 80.0–100.0)
Platelets: 248 10*3/uL (ref 150–400)
RBC: 3.7 MIL/uL — ABNORMAL LOW (ref 4.22–5.81)
RDW: 13.7 % (ref 11.5–15.5)
WBC: 12.4 10*3/uL — ABNORMAL HIGH (ref 4.0–10.5)
nRBC: 0 % (ref 0.0–0.2)

## 2023-10-24 LAB — RESP PANEL BY RT-PCR (RSV, FLU A&B, COVID)  RVPGX2
Influenza A by PCR: NEGATIVE
Influenza B by PCR: NEGATIVE
Resp Syncytial Virus by PCR: NEGATIVE
SARS Coronavirus 2 by RT PCR: NEGATIVE

## 2023-10-24 LAB — TROPONIN I (HIGH SENSITIVITY): Troponin I (High Sensitivity): 4 ng/L (ref <18)

## 2023-10-24 MED ORDER — AMOXICILLIN-POT CLAVULANATE 875-125 MG PO TABS: 1.0000 | ORAL_TABLET | Freq: Two times a day (BID) | ORAL | 0 refills | Status: AC

## 2023-10-24 MED ORDER — DOXYCYCLINE HYCLATE 100 MG PO CAPS
100.0000 mg | ORAL_CAPSULE | Freq: Two times a day (BID) | ORAL | 0 refills | Status: AC
Start: 2023-10-24 — End: 2023-10-31

## 2023-10-24 MED ORDER — BENZONATATE 100 MG PO CAPS: 100.0000 mg | ORAL_CAPSULE | Freq: Three times a day (TID) | ORAL | 0 refills | Status: DC

## 2023-10-24 NOTE — ED Triage Notes (Signed)
 Pt via POV c/o brown-green productive cough and tightness in chest and chills since Friday. Denies exposure to sick contacts. Hx recurrent pneumonia. Pt a/o x 4 in NAD in triage; hard of hearing w/o hearing aids on hand. Denies fevers. No n/v/dizziness/SOB.

## 2023-10-24 NOTE — ED Notes (Signed)
 Discharge instructions reviewed with patient. Patient verbalizes understanding, no further questions at this time. Medications/prescriptions and follow up information provided. No acute distress noted at time of departure.

## 2023-10-24 NOTE — ED Provider Notes (Signed)
 [facility] Provider Note   CSN: [id number] Arrival date & time: 10/24/23  1540     History {Add pertinent medical, surgical, social history, OB history to HPI:1} Chief Complaint  Patient presents with   Cough   Chest Pain    Brett Berry is a 73 y.o. male.  HPI     73 year old male with a history of hyperlipidemia, GERD, asthma, bradycardia, presents with concern for productive cough, chest tightness since Friday   2 weeks ago had sinus infection, thinks drained to chest, asthma affected Friday chest started hurting, cough started Cough productive of yellow-green sputum, green brown this AM Left eye with discharge and shut this AM Sore throat This AM felt fever-sh, has been taking aleve Chills Body aches No asthma/wheezing. No dyspnea CP feels like tightness like asthma but not   Past Medical History:  Diagnosis Date   Allergic rhinitis    Asthma    Bilateral knee pain 2016   x-ray showed medial compartment arthritis, worse on right than left   Erectile dysfunction 07/30/2014   GERD (gastroesophageal reflux disease)    Hearing difficulty    has aids   History of kidney stones    Hyperlipidemia    intolerant to zocor   Pneumonia    Sinusitis, chronic      Home Medications Prior to Admission medications   Medication Sig Start Date End Date Taking? Authorizing Provider  albuterol (VENTOLIN HFA) 108 (90 Base) MCG/ACT inhaler Inhale 2 puffs into the lungs every 4 (four) hours as needed for wheezing or shortness of breath. 05/26/22   Wanda Plump, MD  atorvastatin (LIPITOR) 10 MG tablet Take 1 tablet (10 mg total) by mouth at bedtime. 05/28/23   Wanda Plump, MD  budesonide-formoterol [facility]) 160-4.5 MCG/ACT inhaler Inhale 2 puffs into the lungs in the morning and at bedtime. 05/28/23   Wanda Plump, MD  celecoxib (CELEBREX) 100 MG capsule Take 1 capsule (100 mg total) by mouth 2 (two) times daily as needed for  moderate pain (pain score 4-6). 05/28/23   Wanda Plump, MD  Coenzyme Q10 (CO Q 10 PO) Take 1 capsule by mouth daily.     [provider]  esomeprazole (NEXIUM) 40 MG capsule Take 1 capsule (40 mg total) by mouth 2 (two) times daily before a meal. 05/28/23   Paz, Nolon Rod, MD  fluticasone [facility]) 50 MCG/ACT nasal spray Place 2 sprays into both nostrils daily for 14 days. 02/15/21   Gailen Shelter, PA  magnesium oxide (MAG-OX) 400 MG tablet Take 400 mg by mouth daily.    [provider]  Multiple Vitamin (MULTIVITAMIN WITH MINERALS) TABS tablet Take 1 tablet by mouth daily.    [provider]  Omega-3 Fatty Acids (FISH OIL) 1000 MG CAPS Take 1,000 mg by mouth 2 (two) times daily.     [provider]  traMADol (ULTRAM) 50 MG tablet Take 1 tablet (50 mg total) by mouth every 6 (six) hours as needed. Patient not taking: Reported on 05/28/2023 11/23/22 11/23/23  Julieanne Cotton, PA-C      Allergies    Patient has no known allergies.    Review of Systems   Review of Systems  Physical Exam Updated Vital Signs BP (!) 162/61 (BP Location: Right Arm)   Pulse (!) 56   Temp 98.9 F (37.2 C) (Oral)   Resp 20   Ht 6' (1.829 m)   Wt 79.4  kg   SpO2 98%   BMI 23.73 kg/m  Physical Exam  ED Results / Procedures / Treatments   Labs (all labs ordered are listed, but only abnormal results are displayed) Labs Reviewed  BASIC METABOLIC PANEL - Abnormal; Notable for the following components:      Result Value   Glucose, Bld 119 (*)    Calcium 8.6 (*)    All other components within normal limits  CBC - Abnormal; Notable for the following components:   WBC 12.4 (*)    RBC 3.70 (*)    Hemoglobin 12.1 (*)    HCT 35.5 (*)    All other components within normal limits  RESP PANEL BY RT-PCR (RSV, FLU A&B, COVID)  RVPGX2  TROPONIN I (HIGH SENSITIVITY)    EKG None  Radiology DG Chest 2 View Result Date: 10/24/2023 CLINICAL DATA:  Three day history of chest  pain, chills, and cough EXAM: CHEST - 2 VIEW COMPARISON:  Chest radiograph dated 05/14/2022 FINDINGS: Normal lung volumes. Patchy left upper, middle lobe and lingular opacities. No pleural effusion or pneumothorax. The heart size and mediastinal contours are within normal limits. No acute osseous abnormality. IMPRESSION: Patchy left upper, middle lobe and lingular opacities, suspicious for multifocal pneumonia. Electronically Signed   By: Agustin Cree M.D.   On: 10/24/2023 16:46    Procedures Procedures  {Document cardiac monitor, telemetry assessment procedure when appropriate:1}  Medications Ordered in ED Medications - No data to display  ED Course/ Medical Decision Making/ A&P   {   Click here for ABCD2, HEART and other calculatorsREFRESH Note before signing :1}                              Medical Decision Making Amount and/or Complexity of Data Reviewed Labs: ordered. Radiology: ordered.   ***  {Document critical care time when appropriate:1} {Document review of labs and clinical decision tools ie heart score, Chads2Vasc2 etc:1}  {Document your independent review of radiology images, and any outside records:1} {Document your discussion with family members, caretakers, and with consultants:1} {Document social determinants of health affecting pt's care:1} {Document your decision making why or why not admission, treatments were needed:1} Final Clinical Impression(s) / ED Diagnoses Final diagnoses:  None    Rx / DC Orders ED Discharge Orders     None

## 2023-10-28 ENCOUNTER — Telehealth: Payer: Self-pay | Admitting: Internal Medicine

## 2023-10-28 NOTE — Telephone Encounter (Signed)
 Please call patient, went to the ER with pneumonia, needs follow-up next week. Sooner if needed.

## 2023-10-28 NOTE — Telephone Encounter (Signed)
 Okay to keep the visit for 4/25.  Need to seek medical attention if he is not feeling better

## 2023-10-28 NOTE — Telephone Encounter (Signed)
LMOM informing Pt of PCP recommendations.  

## 2023-10-28 NOTE — Telephone Encounter (Signed)
 Spoke w/ Pt- he will be in [place] for the next 2 weeks, he will be back on the week of 4/14- he then has an appt already scheduled w/ you on 4/25- Pt wants to know if okay to keep visit for that day?

## 2023-11-15 ENCOUNTER — Ambulatory Visit (INDEPENDENT_AMBULATORY_CARE_PROVIDER_SITE_OTHER): Payer: Medicare HMO | Admitting: *Deleted

## 2023-11-15 DIAGNOSIS — Z0000 Encounter for general adult medical examination without abnormal findings: Secondary | ICD-10-CM

## 2023-11-15 MED ORDER — ALBUTEROL SULFATE HFA 108 (90 BASE) MCG/ACT IN AERS
2.0000 | INHALATION_SPRAY | RESPIRATORY_TRACT | 2 refills | Status: AC | PRN
Start: 2023-11-15 — End: ?

## 2023-11-15 NOTE — Progress Notes (Signed)
 Subjective:   Brett Berry is a 73 y.o. male who presents for Medicare Annual/Subsequent preventive examination.  Visit Complete: Virtual I connected with  [name] on 11/15/23 by a audio enabled telemedicine application and verified that I am speaking with the correct person using two identifiers.  Patient Location: Home  Provider Location: Office/Clinic  I discussed the limitations of evaluation and management by telemedicine. The patient expressed understanding and agreed to proceed.  Vital Signs: Because this visit was a virtual/telehealth visit, some criteria may be missing or patient reported. Any vitals not documented were not able to be obtained and vitals that have been documented are patient reported.  Cardiac Risk Factors include: advanced age (>82men, >55 women);male gender;dyslipidemia     Objective:    There were no vitals filed for this visit. There is no height or weight on file to calculate BMI.     11/15/2023    2:28 PM 10/24/2023    3:55 PM 10/29/2022   12:58 PM 05/04/2022    2:59 PM 01/27/2022    4:58 PM 10/27/2021    1:35 PM 02/15/2021   11:58 AM  Advanced Directives  Does Patient Have a Medical Advance Directive? No No No No No No No  Would patient like information on creating a medical advance directive? No - Patient declined  No - Patient declined No - Patient declined   No - Patient declined    Current Medications (verified) Outpatient Encounter Medications as of 11/15/2023  Medication Sig   albuterol (VENTOLIN HFA) 108 (90 Base) MCG/ACT inhaler Inhale 2 puffs into the lungs every 4 (four) hours as needed for wheezing or shortness of breath.   atorvastatin (LIPITOR) 10 MG tablet Take 1 tablet (10 mg total) by mouth at bedtime.   budesonide-formoterol (SYMBICORT) 160-4.5 MCG/ACT inhaler Inhale 2 puffs into the lungs in the morning and at bedtime.   celecoxib (CELEBREX) 100 MG capsule Take 1 capsule (100 mg total) by mouth 2 (two) times daily as  needed for moderate pain (pain score 4-6).   Coenzyme Q10 (CO Q 10 PO) Take 1 capsule by mouth daily.    esomeprazole (NEXIUM) 40 MG capsule Take 1 capsule (40 mg total) by mouth 2 (two) times daily before a meal.   fluticasone (FLONASE) 50 MCG/ACT nasal spray Place 2 sprays into both nostrils daily for 14 days.   magnesium oxide (MAG-OX) 400 MG tablet Take 400 mg by mouth daily.   Multiple Vitamin (MULTIVITAMIN WITH MINERALS) TABS tablet Take 1 tablet by mouth daily.   Omega-3 Fatty Acids (FISH OIL) 1000 MG CAPS Take 1,000 mg by mouth 2 (two) times daily.    traMADol (ULTRAM) 50 MG tablet Take 1 tablet (50 mg total) by mouth every 6 (six) hours as needed. (Patient not taking: Reported on 05/28/2023)   [DISCONTINUED] albuterol (VENTOLIN HFA) 108 (90 Base) MCG/ACT inhaler Inhale 2 puffs into the lungs every 4 (four) hours as needed for wheezing or shortness of breath.   [DISCONTINUED] benzonatate (TESSALON) 100 MG capsule Take 1 capsule (100 mg total) by mouth every 8 (eight) hours.   No facility-administered encounter medications on file as of 11/15/2023.    Allergies (verified) Patient has no known allergies.   History: Past Medical History:  Diagnosis Date   Allergic rhinitis    Asthma    Bilateral knee pain 2016   x-ray showed medial compartment arthritis, worse on right than left   Erectile dysfunction 07/30/2014   GERD (gastroesophageal reflux disease)  Hearing difficulty    has aids   History of kidney stones    Hyperlipidemia    intolerant to zocor   Pneumonia    Sinusitis, chronic    Past Surgical History:  Procedure Laterality Date   CARPAL TUNNEL RELEASE Right 06/2018   CATARACT EXTRACTION Right 2016   COLONOSCOPY  09/25/2011   EYE SURGERY  10-15-2014   Cataract surgery, right eye   JOINT REPLACEMENT  08/30/2018   MASTOIDECTOMY     right    POLYPECTOMY     right hand surg     for infection in '07   SINUS ENDO WITH FUSION     TOE SURGERY     Left Great toe    TOTAL KNEE ARTHROPLASTY Bilateral 08/30/2018   TOTAL KNEE ARTHROPLASTY Bilateral 08/30/2018   Procedure: BILATERAL TOTAL KNEE ARTHROPLASTY;  Surgeon: Cammy Copa, MD;  Location: [facility] OR;  Service: Orthopedics;  Laterality: Bilateral;   VITRECTOMY AND CATARACT Bilateral 09/2022   Family History  Problem Relation Age of Onset   COPD Mother    Breast cancer Mother    Colon polyps Mother    Alzheimer's disease Father    Lung cancer Father        smoker   Prostate cancer Father 47       died age 45   Heart disease Father        age 78   Colon cancer Neg Hx    Esophageal cancer Neg Hx    Rectal cancer Neg Hx    Stomach cancer Neg Hx    Social History   Socioeconomic History   Marital status: Married    Spouse name: Not on file   Number of children: 1   Years of education: Not on file   Highest education level: Not on file  Occupational History   Occupation: Scientific laboratory technician: [employer]  Tobacco Use   Smoking status: Former    Current packs/day: 0.00    Types: Cigarettes    Quit date: 09/08/1973    Years since quitting: 50.2   Smokeless tobacco: Never   Tobacco comments:    Quit I the 1980s  Vaping Use   Vaping status: Never Used  Substance and Sexual Activity   Alcohol use: Yes    Alcohol/week: 3.0 standard drinks of alcohol    Types: 3 Cans of beer per week   Drug use: No   Sexual activity: Yes    Partners: Female  Other Topics Concern   Not on file  Social History Narrative   HSG. Married '76- 6 years divorced ; '85. 1 son- '86.    Lost wife 12-2014   Remarried    Social Drivers of Health   Financial Resource Strain: Low Risk  (11/15/2023)   Overall Financial Resource Strain (CARDIA)    Difficulty of Paying Living Expenses: Not hard at all  Food Insecurity: No Food Insecurity (11/15/2023)   Hunger Vital Sign    Worried About Running Out of Food in the Last Year: Never true    Ran Out of Food in the Last Year: Never true   Transportation Needs: No Transportation Needs (11/15/2023)   PRAPARE - Administrator, Civil Service (Medical): No    Lack of Transportation (Non-Medical): No  Physical Activity: Inactive (11/15/2023)   Exercise Vital Sign    Days of Exercise per Week: 0 days    Minutes of Exercise per Session: 0  min  Stress: No Stress Concern Present (11/15/2023)   [employer] of Occupational Health - Occupational Stress Questionnaire    Feeling of Stress : Not at all  Social Connections: Socially Integrated (11/15/2023)   Social Connection and Isolation Panel [NHANES]    Frequency of Communication with Friends and Family: More than three times a week    Frequency of Social Gatherings with Friends and Family: More than three times a week    Attends Religious Services: More than 4 times per year    Active Member of [employer] or Organizations: Yes    Attends Engineer, structural: More than 4 times per year    Marital Status: Married    Tobacco Counseling Counseling given: Not Answered Tobacco comments: Quit I the 1980s   Clinical Intake:  Pre-visit preparation completed: Yes  Pain : No/denies pain     Nutritional Risks: None Diabetes: No  How often do you need to have someone help you when you read instructions, pamphlets, or other written materials from your doctor or pharmacy?: 1 - Never  Interpreter Needed?: No  Information entered by :: [employer], CMA   Activities of Daily Living    11/15/2023    1:41 PM  In your present state of health, do you have any difficulty performing the following activities:  Hearing? 1  Comment hearing aids  Vision? 0  Difficulty concentrating or making decisions? 0  Walking or climbing stairs? 0  Dressing or bathing? 0  Doing errands, shopping? 0  Preparing Food and eating ? N  Using the Toilet? N  In the past six months, have you accidently leaked urine? N  Do you have problems with loss of bowel control? N  Managing  your Medications? N  Managing your Finances? N  Housekeeping or managing your Housekeeping? N    Patient Care Team: Ezell Hollow, MD as PCP - General (Internal Medicine) Prescott Brodie, MD (Inactive) Seabron Cypress, Timmothy Foots, MD (Allergy and Immunology) Rozelle Corning Maricela Shoe, MD as Consulting Physician (Orthopedic Surgery)  Indicate any recent Medical Services you may have received from other than [facility] providers in the past year (date may be approximate).     Assessment:   This is a routine wellness examination for Derelle.  Hearing/Vision screen No results found.   Goals Addressed   None    Depression Screen    11/15/2023    2:33 PM 05/28/2023    8:28 AM 10/29/2022    1:02 PM 05/26/2022    8:04 AM 10/27/2021    1:37 PM 05/19/2021    1:13 PM 10/17/2019    8:15 AM  PHQ 2/9 Scores  PHQ - 2 Score 0 0 0 0 0 0 0    Fall Risk    11/15/2023    2:27 PM 05/28/2023    8:28 AM 10/29/2022    1:00 PM 05/26/2022    8:04 AM 10/27/2021    1:36 PM  Fall Risk   Falls in the past year? 0 0 0 0 0  Number falls in past yr: 0 0 0 0 0  Injury with Fall? 0 0 0 0 0  Risk for fall due to : No Fall Risks  No Fall Risks    Follow up Falls evaluation completed Falls evaluation completed Falls evaluation completed Falls evaluation completed Falls prevention discussed    MEDICARE RISK AT HOME: Medicare Risk at Home Any stairs in or around the home?: Yes (1 step into home) If so,  are there any without handrails?: Yes Home free of loose throw rugs in walkways, pet beds, electrical cords, etc?: Yes Adequate lighting in your home to reduce risk of falls?: Yes Life alert?: No Use of a cane, walker or w/c?: No Grab bars in the bathroom?: No Shower chair or bench in shower?: No Elevated toilet seat or a handicapped toilet?: Yes (comfort height)  TIMED UP AND GO:  Was the test performed?  No    Cognitive Function:        11/15/2023    2:35 PM 10/29/2022    1:06 PM  6CIT Screen  What Year?  0 points 0 points  What month? 0 points 0 points  What time? 0 points 0 points  Count back from 20 0 points 0 points  Months in reverse 0 points 0 points  Repeat phrase 0 points 0 points  Total Score 0 points 0 points    Immunizations Immunization History  Administered Date(s) Administered   Fluad Quad(high Dose 65+) 04/19/2019, 04/22/2020, 05/19/2021, 05/26/2022   Fluad Trivalent(High Dose 65+) 05/28/2023   Influenza Split 07/03/2014   Influenza, Seasonal, Injecte, Preservative Fre 04/22/2015   Influenza,inj,Quad PF,6+ Mos 05/22/2018   Influenza-Unspecified 04/09/2016, 05/07/2017   PNEUMOCOCCAL CONJUGATE-20 05/26/2022   Pneumococcal Conjugate-13 12/29/2013   Pneumococcal Polysaccharide-23 09/09/2011   Tdap 09/09/2011, 08/01/2021   Zoster Recombinant(Shingrix) 03/02/2018, 07/04/2018   Zoster, Live 01/08/2015    TDAP status: Up to date  Flu Vaccine status: Up to date  Pneumococcal vaccine status: Up to date  Covid-19 vaccine status: Declined, Education has been provided regarding the importance of this vaccine but patient still declined. Advised may receive this vaccine at local pharmacy or Health Dept.or vaccine clinic. Aware to provide a copy of the vaccination record if obtained from local pharmacy or Health Dept. Verbalized acceptance and understanding.  Qualifies for Shingles Vaccine? Yes   Zostavax completed Yes   Shingrix Completed?: Yes  Screening Tests Health Maintenance  Topic Date Due   Medicare Annual Wellness (AWV)  10/29/2023   INFLUENZA VACCINE  03/03/2024   Colonoscopy  11/27/2026   DTaP/Tdap/Td (3 - Td or Tdap) 08/02/2031   Pneumonia Vaccine 39+ Years old  Completed   Hepatitis C Screening  Completed   Zoster Vaccines- Shingrix  Completed   HPV VACCINES  Aged Out   Meningococcal B Vaccine  Aged Out   COVID-19 Vaccine  Discontinued    Health Maintenance  Health Maintenance Due  Topic Date Due   Medicare Annual Wellness (AWV)  10/29/2023     Colorectal cancer screening: Type of screening: Colonoscopy. Completed 11/27/19. Repeat every 7 years  Lung Cancer Screening: (Low Dose CT Chest recommended if Age 15-80 years, 20 pack-year currently smoking OR have quit w/in 15years.) does not qualify.    Additional Screening:  Hepatitis C Screening: does qualify; Completed 02/18/17  Vision Screening: Recommended annual ophthalmology exams for early detection of glaucoma and other disorders of the eye. Is the patient up to date with their annual eye exam?  Yes  Who is the provider or what is the name of the office in which the patient attends annual eye exams? Dr  Candi Chafe If pt is not established with a provider, would they like to be referred to a provider to establish care? No .   Dental Screening: Recommended annual dental exams for proper oral hygiene  Diabetic Foot Exam: NA  Community Resource Referral / Chronic Care Management: CRR required this visit?  No   CCM required this  visit?  No     Plan:     I have personally reviewed and noted the following in the patient's chart:   Medical and social history Use of alcohol, tobacco or illicit drugs  Current medications and supplements including opioid prescriptions. Patient is not currently taking opioid prescriptions. Functional ability and status Nutritional status Physical activity Advanced directives List of other physicians Hospitalizations, surgeries, and ER visits in previous 12 months Vitals Screenings to include cognitive, depression, and falls Referrals and appointments  In addition, I have reviewed and discussed with patient certain preventive protocols, quality metrics, and best practice recommendations. A written personalized care plan for preventive services as well as general preventive health recommendations were provided to patient.     Peter Brands, CMA   11/15/2023   After Visit Summary: (MyChart) Due to this being a telephonic visit, the after  visit summary with patients personalized plan was offered to patient via MyChart   Nurse Notes: none

## 2023-11-15 NOTE — Patient Instructions (Signed)
 Brett Berry , Thank you for taking time to come for your Medicare Wellness Visit. I appreciate your ongoing commitment to your health goals. Please review the following plan we discussed and let me know if I can assist you in the future.   These are the goals we discussed:  Goals      DIET - EAT MORE FRUITS AND VEGETABLES     And eat less fried food.     Patient Stated     Maintain current active lifestyle        This is a list of the screening recommended for you and due dates:  Health Maintenance  Topic Date Due   Flu Shot  03/03/2024   Medicare Annual Wellness Visit  11/14/2024   Colon Cancer Screening  11/27/2026   DTaP/Tdap/Td vaccine (3 - Td or Tdap) 08/02/2031   Pneumonia Vaccine  Completed   Hepatitis C Screening  Completed   Zoster (Shingles) Vaccine  Completed   HPV Vaccine  Aged Out   Meningitis B Vaccine  Aged Out   COVID-19 Vaccine  Discontinued     Next appointment: Follow up in one year for your annual wellness visit.   Preventive Care 44 Years and Older, Male Preventive care refers to lifestyle choices and visits with your health care provider that can promote health and wellness. What does preventive care include? A yearly physical exam. This is also called an annual well check. Dental exams once or twice a year. Routine eye exams. Ask your health care provider how often you should have your eyes checked. Personal lifestyle choices, including: Daily care of your teeth and gums. Regular physical activity. Eating a healthy diet. Avoiding tobacco and drug use. Limiting alcohol use. Practicing safe sex. Taking low doses of aspirin every day. Taking vitamin and mineral supplements as recommended by your health care provider. What happens during an annual well check? The services and screenings done by your health care provider during your annual well check will depend on your age, overall health, lifestyle risk factors, and family history of  disease. Counseling  Your health care provider may ask you questions about your: Alcohol use. Tobacco use. Drug use. Emotional well-being. Home and relationship well-being. Sexual activity. Eating habits. History of falls. Memory and ability to understand (cognition). Work and work Astronomer. Screening  You may have the following tests or measurements: Height, weight, and BMI. Blood pressure. Lipid and cholesterol levels. These may be checked every 5 years, or more frequently if you are over 11 years old. Skin check. Lung cancer screening. You may have this screening every year starting at age 34 if you have a 30-pack-year history of smoking and currently smoke or have quit within the past 15 years. Fecal occult blood test (FOBT) of the stool. You may have this test every year starting at age 53. Flexible sigmoidoscopy or colonoscopy. You may have a sigmoidoscopy every 5 years or a colonoscopy every 10 years starting at age 39. Prostate cancer screening. Recommendations will vary depending on your family history and other risks. Hepatitis C blood test. Hepatitis B blood test. Sexually transmitted disease (STD) testing. Diabetes screening. This is done by checking your blood sugar (glucose) after you have not eaten for a while (fasting). You may have this done every 1-3 years. Abdominal aortic aneurysm (AAA) screening. You may need this if you are a current or former smoker. Osteoporosis. You may be screened starting at age 45 if you are at high risk. Talk with  your health care provider about your test results, treatment options, and if necessary, the need for more tests. Vaccines  Your health care provider may recommend certain vaccines, such as: Influenza vaccine. This is recommended every year. Tetanus, diphtheria, and acellular pertussis (Tdap, Td) vaccine. You may need a Td booster every 10 years. Zoster vaccine. You may need this after age 47. Pneumococcal 13-valent  conjugate (PCV13) vaccine. One dose is recommended after age 12. Pneumococcal polysaccharide (PPSV23) vaccine. One dose is recommended after age 27. Talk to your health care provider about which screenings and vaccines you need and how often you need them. This information is not intended to replace advice given to you by your health care provider. Make sure you discuss any questions you have with your health care provider. Document Released: 08/16/2015 Document Revised: 04/08/2016 Document Reviewed: 05/21/2015 Elsevier Interactive Patient Education  2017 [employer].  Fall Prevention in the Home Falls can cause injuries. They can happen to people of all ages. There are many things you can do to make your home safe and to help prevent falls. What can I do on the outside of my home? Regularly fix the edges of walkways and driveways and fix any cracks. Remove anything that might make you trip as you walk through a door, such as a raised step or threshold. Trim any bushes or trees on the path to your home. Use bright outdoor lighting. Clear any walking paths of anything that might make someone trip, such as rocks or tools. Regularly check to see if handrails are loose or broken. Make sure that both sides of any steps have handrails. Any raised decks and porches should have guardrails on the edges. Have any leaves, snow, or ice cleared regularly. Use sand or salt on walking paths during winter. Clean up any spills in your garage right away. This includes oil or grease spills. What can I do in the bathroom? Use night lights. Install grab bars by the toilet and in the tub and shower. Do not use towel bars as grab bars. Use non-skid mats or decals in the tub or shower. If you need to sit down in the shower, use a plastic, non-slip stool. Keep the floor dry. Clean up any water that spills on the floor as soon as it happens. Remove soap buildup in the tub or shower regularly. Attach bath mats  securely with double-sided non-slip rug tape. Do not have throw rugs and other things on the floor that can make you trip. What can I do in the bedroom? Use night lights. Make sure that you have a light by your bed that is easy to reach. Do not use any sheets or blankets that are too big for your bed. They should not hang down onto the floor. Have a firm chair that has side arms. You can use this for support while you get dressed. Do not have throw rugs and other things on the floor that can make you trip. What can I do in the kitchen? Clean up any spills right away. Avoid walking on wet floors. Keep items that you use a lot in easy-to-reach places. If you need to reach something above you, use a strong step stool that has a grab bar. Keep electrical cords out of the way. Do not use floor polish or wax that makes floors slippery. If you must use wax, use non-skid floor wax. Do not have throw rugs and other things on the floor that can make you trip.  What can I do with my stairs? Do not leave any items on the stairs. Make sure that there are handrails on both sides of the stairs and use them. Fix handrails that are broken or loose. Make sure that handrails are as long as the stairways. Check any carpeting to make sure that it is firmly attached to the stairs. Fix any carpet that is loose or worn. Avoid having throw rugs at the top or bottom of the stairs. If you do have throw rugs, attach them to the floor with carpet tape. Make sure that you have a light switch at the top of the stairs and the bottom of the stairs. If you do not have them, ask someone to add them for you. What else can I do to help prevent falls? Wear shoes that: Do not have high heels. Have rubber bottoms. Are comfortable and fit you well. Are closed at the toe. Do not wear sandals. If you use a stepladder: Make sure that it is fully opened. Do not climb a closed stepladder. Make sure that both sides of the stepladder  are locked into place. Ask someone to hold it for you, if possible. Clearly mark and make sure that you can see: Any grab bars or handrails. First and last steps. Where the edge of each step is. Use tools that help you move around (mobility aids) if they are needed. These include: Canes. Walkers. Scooters. Crutches. Turn on the lights when you go into a dark area. Replace any light bulbs as soon as they burn out. Set up your furniture so you have a clear path. Avoid moving your furniture around. If any of your floors are uneven, fix them. If there are any pets around you, be aware of where they are. Review your medicines with your doctor. Some medicines can make you feel dizzy. This can increase your chance of falling. Ask your doctor what other things that you can do to help prevent falls. This information is not intended to replace advice given to you by your health care provider. Make sure you discuss any questions you have with your health care provider. Document Released: 05/16/2009 Document Revised: 12/26/2015 Document Reviewed: 08/24/2014 Elsevier Interactive Patient Education  2017 [employer].

## 2023-11-25 ENCOUNTER — Other Ambulatory Visit: Payer: Self-pay | Admitting: Internal Medicine

## 2023-11-26 ENCOUNTER — Ambulatory Visit: Payer: Medicare HMO | Admitting: Internal Medicine

## 2023-11-26 ENCOUNTER — Ambulatory Visit (HOSPITAL_BASED_OUTPATIENT_CLINIC_OR_DEPARTMENT_OTHER)
Admission: RE | Admit: 2023-11-26 | Discharge: 2023-11-26 | Disposition: A | Discharge status: Home / Self Care | Source: Ambulatory Visit | Attending: Internal Medicine | Admitting: Internal Medicine

## 2023-11-26 ENCOUNTER — Encounter: Payer: Self-pay | Admitting: Internal Medicine

## 2023-11-26 VITALS — BP 130/64 | HR 49 | Temp 98.3°F | Resp 16 | Ht 70.0 in | Wt 175.2 lb

## 2023-11-26 DIAGNOSIS — D649 Anemia, unspecified: Secondary | ICD-10-CM | POA: Diagnosis not present

## 2023-11-26 DIAGNOSIS — J189 Pneumonia, unspecified organism: Secondary | ICD-10-CM | POA: Insufficient documentation

## 2023-11-26 DIAGNOSIS — E785 Hyperlipidemia, unspecified: Secondary | ICD-10-CM

## 2023-11-26 DIAGNOSIS — R918 Other nonspecific abnormal finding of lung field: Secondary | ICD-10-CM | POA: Diagnosis not present

## 2023-11-26 DIAGNOSIS — R739 Hyperglycemia, unspecified: Secondary | ICD-10-CM | POA: Diagnosis not present

## 2023-11-26 LAB — CBC WITH DIFFERENTIAL/PLATELET
Basophils Absolute: 0.1 10*3/uL (ref 0.0–0.1)
Basophils Relative: 0.6 % (ref 0.0–3.0)
Eosinophils Absolute: 1 10*3/uL — ABNORMAL HIGH (ref 0.0–0.7)
Eosinophils Relative: 11.3 % — ABNORMAL HIGH (ref 0.0–5.0)
HCT: 36.1 % — ABNORMAL LOW (ref 39.0–52.0)
Hemoglobin: 12.2 g/dL — ABNORMAL LOW (ref 13.0–17.0)
Lymphocytes Relative: 22.6 % (ref 12.0–46.0)
Lymphs Abs: 2.1 10*3/uL (ref 0.7–4.0)
MCHC: 33.7 g/dL (ref 30.0–36.0)
MCV: 97.3 fl (ref 78.0–100.0)
Monocytes Absolute: 0.7 10*3/uL (ref 0.1–1.0)
Monocytes Relative: 7.3 % (ref 3.0–12.0)
Neutro Abs: 5.4 10*3/uL (ref 1.4–7.7)
Neutrophils Relative %: 58.2 % (ref 43.0–77.0)
Platelets: 229 10*3/uL (ref 150.0–400.0)
RBC: 3.71 Mil/uL — ABNORMAL LOW (ref 4.22–5.81)
RDW: 13.9 % (ref 11.5–15.5)
WBC: 9.2 10*3/uL (ref 4.0–10.5)

## 2023-11-26 LAB — HEMOGLOBIN A1C: Hgb A1c MFr Bld: 5.6 % (ref 4.6–6.5)

## 2023-11-26 NOTE — Patient Instructions (Signed)
 INSTRUCTIONS  FOR TODAY  We are referring you to the pulmonary doctors regards to your frequent pneumonias. You can reach them at [phone]  GO TO THE LAB : Get the blood work     Next office visit for a physical exam by October 2025 Please make an appointment before you leave today     STOP BY THE FIRST FLOOR:  get the XR

## 2023-11-26 NOTE — Progress Notes (Signed)
 Subjective:    Patient ID: Brett Berry, male    DOB: 19-Dec-1950, 73 y.o.   MRN: [id number]  DOS:  11/26/2023 Type of visit - description: f/u  Developed respiratory symptoms 2 weeks prior to the ER evaluation on 10/24/2023. He has sinus congestion, cough with dark-green sputum but no hemoptysis. Low-grade fever. + Anterior chest pain and mild shortness of breath.  Workup in the ER: Chest x-ray showed several patchy infiltrates. Respiratory viral panel negative, WBC 12.4, hemoglobin slightly low. Treated with Augmentin  and doxycycline   At this point he feels better, only remaining symptom is some fatigue.  Did have chills and sweats 2 nights ago. Appetite is good.  Denies nausea or vomiting. Asthma was minimal exacerbation during the pneumonia but is back to normal. No GERD symptoms.   Wt Readings from Last 3 Encounters:  11/26/23 175 lb 4 oz (79.5 kg)  10/24/23 175 lb (79.4 kg)  05/28/23 179 lb 8 oz (81.4 kg)     Review of Systems See above   Past Medical History:  Diagnosis Date   Allergic rhinitis    Asthma    Bilateral knee pain 2016   x-ray showed medial compartment arthritis, worse on right than left   Erectile dysfunction 07/30/2014   GERD (gastroesophageal reflux disease)    Hearing difficulty    has aids   History of kidney stones    Hyperlipidemia    intolerant to zocor    Pneumonia    Sinusitis, chronic     Past Surgical History:  Procedure Laterality Date   CARPAL TUNNEL RELEASE Right 06/2018   CATARACT EXTRACTION Right 2016   COLONOSCOPY  09/25/2011   EYE SURGERY  10-15-2014   Cataract surgery, right eye   JOINT REPLACEMENT  08/30/2018   MASTOIDECTOMY     right    POLYPECTOMY     right hand surg     for infection in '07   SINUS ENDO WITH FUSION     TOE SURGERY     Left Great toe   TOTAL KNEE ARTHROPLASTY Bilateral 08/30/2018   TOTAL KNEE ARTHROPLASTY Bilateral 08/30/2018   Procedure: BILATERAL TOTAL KNEE ARTHROPLASTY;  Surgeon: Jasmine Mesi, MD;  Location: [facility] OR;  Service: Orthopedics;  Laterality: Bilateral;   VITRECTOMY AND CATARACT Bilateral 09/2022    Current Outpatient Medications  Medication Instructions   albuterol  (VENTOLIN  HFA) 108 (90 Base) MCG/ACT inhaler 2 puffs, Inhalation, Every 4 hours PRN   atorvastatin  (LIPITOR) 10 mg, Oral, Daily at bedtime   budesonide -formoterol  (SYMBICORT ) 160-4.5 MCG/ACT inhaler 2 puffs, Inhalation, 2 times daily   celecoxib  (CELEBREX ) 100 mg, Oral, 2 times daily PRN   Coenzyme Q10 (CO Q 10 PO) 1 capsule, Daily   esomeprazole  (NEXIUM ) 40 mg, Oral, 2 times daily before meals   Fish Oil 1,000 mg, 2 times daily   fluticasone  (FLONASE ) 50 MCG/ACT nasal spray 2 sprays, Each Nare, Daily   magnesium  oxide (MAG-OX) 400 mg, Daily   Multiple Vitamin (MULTIVITAMIN WITH MINERALS) TABS tablet 1 tablet, Daily       Objective:   Physical Exam BP 130/64   Pulse (!) 49   Temp 98.3 F (36.8 C) (Oral)   Resp 16   Ht 5\' 10"  (1.778 m)   Wt 175 lb 4 oz (79.5 kg)   SpO2 98%   BMI 25.15 kg/m  General:   Well developed, NAD, BMI noted. HEENT:  Normocephalic . Face symmetric, atraumatic Lungs:  CTA B Normal respiratory effort, no intercostal retractions,  no accessory muscle use. Heart: RRR,  no murmur.  Lower extremities: no pretibial edema bilaterally  Skin: Not pale. Not jaundice Neurologic:  alert & oriented X3.  Speech normal, gait appropriate for age and unassisted Psych--  Cognition and judgment appear intact.  Cooperative with normal attention span and concentration.  Behavior appropriate. No anxious or depressed appearing.      Assessment    Assessment Hyperlipidemia ---intolerant to simvastatin  Asthma GERD E.D. ENT: --Chronic sinusitis, Rhinitis --Chronic hoarseness --Chronic perforated TM (R) --HOH has aids DJD- knees R>>L, wrists  , Dr Julio Ohm Bradycardia   PLAN: Recurrent pneumonias: Had pneumonia as documented by chest x-ray 2018, 2019, 2022 and  now w/  multifocal pneumonia. He has a history of asthma on chronic inhalers and GERD on chronic PPIs. He stopped smoking in the 1980s. Plan: Chest x-ray, refer to pulmonary regards recurrent pneumonias. Hyperglycemia: Mild, check A1c Asthma: Continue Symbicort . GERD: Controlled on PPIs.  No change High cholesterol: Well-controlled on atorvastatin  Preventive care: Recommend RSV Social: He actually works in [place] 2 weeks of every month. RTC 05/2024 CPX

## 2023-11-28 NOTE — Assessment & Plan Note (Signed)
 Recurrent pneumonias: Had pneumonia as documented by chest x-ray 2018, 2019, 2022 and now w/  multifocal pneumonia. He has a history of asthma on chronic inhalers and GERD on chronic PPIs. He stopped smoking in the 1980s. Plan: Chest x-ray, refer to pulmonary regards recurrent pneumonias. Hyperglycemia: Mild, check A1c Asthma: Continue Symbicort . GERD: Controlled on PPIs.  No change High cholesterol: Well-controlled on atorvastatin  Preventive care: Recommend RSV Social: He actually works in [place] 2 weeks of every month. RTC 05/2024 CPX

## 2023-11-29 ENCOUNTER — Encounter: Payer: Self-pay | Admitting: Internal Medicine

## 2023-12-01 ENCOUNTER — Encounter: Payer: Self-pay | Admitting: Internal Medicine

## 2023-12-01 ENCOUNTER — Other Ambulatory Visit: Payer: Self-pay | Admitting: Internal Medicine

## 2023-12-01 MED ORDER — LEVOFLOXACIN 500 MG PO TABS: 500.0000 mg | ORAL_TABLET | Freq: Every day | ORAL | 0 refills | Status: AC

## 2023-12-02 ENCOUNTER — Other Ambulatory Visit: Payer: Self-pay | Admitting: Internal Medicine

## 2023-12-22 ENCOUNTER — Encounter: Payer: Self-pay | Admitting: Internal Medicine

## 2023-12-22 ENCOUNTER — Other Ambulatory Visit: Payer: Self-pay

## 2023-12-22 DIAGNOSIS — J189 Pneumonia, unspecified organism: Secondary | ICD-10-CM

## 2024-01-17 ENCOUNTER — Other Ambulatory Visit: Payer: Self-pay

## 2024-01-17 ENCOUNTER — Emergency Department (HOSPITAL_BASED_OUTPATIENT_CLINIC_OR_DEPARTMENT_OTHER)
Admission: EM | Admit: 2024-01-17 | Discharge: 2024-01-17 | Disposition: A | Discharge status: Home / Self Care | Attending: Emergency Medicine | Admitting: Emergency Medicine

## 2024-01-17 ENCOUNTER — Emergency Department (HOSPITAL_BASED_OUTPATIENT_CLINIC_OR_DEPARTMENT_OTHER)

## 2024-01-17 ENCOUNTER — Encounter (HOSPITAL_BASED_OUTPATIENT_CLINIC_OR_DEPARTMENT_OTHER): Payer: Self-pay | Admitting: Emergency Medicine

## 2024-01-17 DIAGNOSIS — H9211 Otorrhea, right ear: Secondary | ICD-10-CM | POA: Diagnosis not present

## 2024-01-17 DIAGNOSIS — S91301A Unspecified open wound, right foot, initial encounter: Secondary | ICD-10-CM | POA: Diagnosis not present

## 2024-01-17 DIAGNOSIS — W458XXA Other foreign body or object entering through skin, initial encounter: Secondary | ICD-10-CM | POA: Insufficient documentation

## 2024-01-17 DIAGNOSIS — S90851A Superficial foreign body, right foot, initial encounter: Secondary | ICD-10-CM | POA: Insufficient documentation

## 2024-01-17 DIAGNOSIS — M19071 Primary osteoarthritis, right ankle and foot: Secondary | ICD-10-CM | POA: Diagnosis not present

## 2024-01-17 DIAGNOSIS — M7731 Calcaneal spur, right foot: Secondary | ICD-10-CM | POA: Diagnosis not present

## 2024-01-17 MED ORDER — AMOXICILLIN-POT CLAVULANATE 875-125 MG PO TABS: 1.0000 | ORAL_TABLET | Freq: Two times a day (BID) | ORAL | 0 refills | Status: DC

## 2024-01-17 NOTE — ED Triage Notes (Signed)
 Pt POv c/o R foot pain, feels like something is in the bottom of it reports purulent discharge x2 days. Denies fever. Denies known injury.   Also c/o R ear drainage.

## 2024-01-17 NOTE — Discharge Instructions (Signed)
 I removed a small foreign body from your right foot.  There is a potential there is remaining foreign body in there.  Please soak your foot in warm salt water 3 times a day for a few days.  I am putting you on some antibiotics for possible infection.  This should also help with your ear.  Avoid water in the ear due to your perforation.  Follow-up with your regular doctor.  Return if any worsening or concerning symptoms

## 2024-01-17 NOTE — ED Provider Notes (Signed)
  EMERGENCY DEPARTMENT AT [facility] Provider Note   CSN: [id number] Arrival date & time: 01/17/24  7829     Patient presents with: Foot Pain and Ear Drainage   Brett Berry is a 73 y.o. male.  He is here with 2 complaints.  #1 is foreign body sensation sole of his right foot.  [place] the beach [place].  #2 is right ear drainage.  History of perforation right ear.  No ear pain.  No fevers.  {Add pertinent medical, surgical, social history, OB history to FAO:13086} The history is provided by the patient.  Foot Pain This is a new problem. The current episode started more than 2 days ago. The problem occurs constantly. The problem has not changed since onset.Pertinent negatives include no chest pain, no abdominal pain, no headaches and no shortness of breath. The symptoms are aggravated by walking. Nothing relieves the symptoms. He has tried nothing for the symptoms. The treatment provided no relief.  Ear Drainage This is a recurrent problem. The current episode started 2 days ago. The problem occurs daily. The problem has not changed since onset.Pertinent negatives include no chest pain, no abdominal pain, no headaches and no shortness of breath. Nothing aggravates the symptoms. Nothing relieves the symptoms. He has tried nothing for the symptoms. The treatment provided no relief.       Prior to Admission medications   Medication Sig Start Date End Date Taking? Authorizing Provider  albuterol  (VENTOLIN  HFA) 108 (90 Base) MCG/ACT inhaler Inhale 2 puffs into the lungs every 4 (four) hours as needed for wheezing or shortness of breath. 11/15/23   Paz, Jose E, MD  atorvastatin  (LIPITOR) 10 MG tablet TAKE 1 TABLET BY MOUTH EVERYDAY AT BEDTIME 12/02/23   Paz, Jose E, MD  budesonide -formoterol  (SYMBICORT ) 160-4.5 MCG/ACT inhaler Inhale 2 puffs into the lungs in the morning and at bedtime. 05/28/23   Ezell Hollow, MD  celecoxib  (CELEBREX ) 100 MG capsule Take 1 capsule (100  mg total) by mouth 2 (two) times daily as needed for moderate pain (pain score 4-6). 11/25/23   Paz, Jose E, MD  Coenzyme Q10 (CO Q 10 PO) Take 1 capsule by mouth daily.     [provider]  esomeprazole  (NEXIUM ) 40 MG capsule Take 1 capsule (40 mg total) by mouth 2 (two) times daily before a meal. 11/25/23   Ezell Hollow, MD  fluticasone  (FLONASE ) 50 MCG/ACT nasal spray Place 2 sprays into both nostrils daily for 14 days. 02/15/21   Coretta Dexter, PA  magnesium  oxide (MAG-OX) 400 MG tablet Take 400 mg by mouth daily.    [provider]  Multiple Vitamin (MULTIVITAMIN WITH MINERALS) TABS tablet Take 1 tablet by mouth daily.    [provider]  Omega-3 Fatty Acids (FISH OIL) 1000 MG CAPS Take 1,000 mg by mouth 2 (two) times daily.     [provider]    Allergies: Patient has no known allergies.    Review of Systems  Respiratory:  Negative for shortness of breath.   Cardiovascular:  Negative for chest pain.  Gastrointestinal:  Negative for abdominal pain.  Neurological:  Negative for headaches.    Updated Vital Signs BP (!) 182/74 (BP Location: Left Arm)   Pulse 63   Temp 98.1 F (36.7 C) (Oral)   Resp 18   Ht 5' 10 (1.778 m)   Wt 84.8 kg   SpO2 99%   BMI 26.83 kg/m   Physical Exam  Vitals and nursing note reviewed.  Constitutional:      Appearance: Normal appearance. He is well-developed.  HENT:     Head: Normocephalic and atraumatic.     Right Ear: Ear canal and external ear normal.     Left Ear: Left ear scarred TM: right.     Ears:     Comments: right TM with small chronic perforation.  No drainage no erythema.  Eyes:     Conjunctiva/sclera: Conjunctivae normal.   Pulmonary:     Effort: Pulmonary effort is normal.   Musculoskeletal:        General: Tenderness present.     Cervical back: Neck supple.     Comments: Right foot there was a small palpable foreign body approximately 1 mm on the plantar surface midfoot.  Area was tender  but there was no fluctuance or erythema.   Skin:    General: Skin is warm and dry.   Neurological:     General: No focal deficit present.     Mental Status: He is alert.     GCS: GCS eye subscore is 4. GCS verbal subscore is 5. GCS motor subscore is 6.     Sensory: No sensory deficit.     Motor: No weakness.     (all labs ordered are listed, but only abnormal results are displayed) Labs Reviewed - No data to display  EKG: None  Radiology: DG Foot Complete Right Result Date: 01/17/2024 CLINICAL DATA:  Right foot wound with drainage for 1 week EXAM: RIGHT FOOT COMPLETE - 3+ VIEW COMPARISON:  None Available. FINDINGS: Frontal, oblique, and lateral views of the right foot are obtained. There are no acute or destructive bony abnormalities. Moderate osteoarthritis of the first metatarsophalangeal joint. Remaining joint spaces are well preserved. Small inferior calcaneal spur. Soft tissues are unremarkable, with no evidence of subcutaneous gas or radiopaque foreign body. IMPRESSION: 1. No acute or destructive bony abnormalities. 2. Moderate osteoarthritis of the first metatarsophalangeal joint. 3. Unremarkable soft tissues. No subcutaneous gas or radiopaque foreign body. Electronically Signed   By: Bobbye Burrow M.D.   On: 01/17/2024 17:16    {Document cardiac monitor, telemetry assessment procedure when appropriate:32947} .Foreign Body Removal  Date/Time: 01/17/2024 5:53 PM  Performed by: Tonya Fredrickson, MD Authorized by: Tonya Fredrickson, MD  Consent: Verbal consent obtained Consent given by: patient Patient understanding: patient states understanding of the procedure being performed Patient identity confirmed: verbally with patient Body area: skin General location: lower extremity Location details: right foot Complexity: simple 1 objects recovered. Objects recovered: small piece of shell Post-procedure assessment: foreign body removed Patient tolerance: patient tolerated the  procedure well with no immediate complications     Medications Ordered in the ED - No data to display    {Click here for ABCD2, HEART and other calculators REFRESH Note before signing:1}                              Medical Decision Making Amount and/or Complexity of Data Reviewed Radiology: ordered.   ***  {Document critical care time when appropriate  Document review of labs and clinical decision tools ie CHADS2VASC2, etc  Document your independent review of radiology images and any outside records  Document your discussion with family members, caretakers and with consultants  Document social determinants of health affecting pt's care  Document your decision making why or why not admission, treatments were needed:32947:::1}   Final diagnoses:  None    ED Discharge Orders     None

## 2024-02-03 ENCOUNTER — Ambulatory Visit: Admitting: Pulmonary Disease

## 2024-02-10 ENCOUNTER — Ambulatory Visit: Admitting: Internal Medicine

## 2024-02-10 VITALS — BP 170/80 | HR 51 | Temp 98.1°F | Ht 71.0 in | Wt 187.6 lb

## 2024-02-10 DIAGNOSIS — R9389 Abnormal findings on diagnostic imaging of other specified body structures: Secondary | ICD-10-CM | POA: Diagnosis not present

## 2024-02-10 DIAGNOSIS — Z87891 Personal history of nicotine dependence: Secondary | ICD-10-CM

## 2024-02-10 DIAGNOSIS — R918 Other nonspecific abnormal finding of lung field: Secondary | ICD-10-CM | POA: Diagnosis not present

## 2024-02-10 DIAGNOSIS — J189 Pneumonia, unspecified organism: Secondary | ICD-10-CM

## 2024-02-10 DIAGNOSIS — Z77120 Contact with and (suspected) exposure to mold (toxic): Secondary | ICD-10-CM | POA: Diagnosis not present

## 2024-02-10 NOTE — Progress Notes (Signed)
 OV 02/10/2024  Subjective:  Patient ID: Brett Berry, male , DOB: 1950-09-27 , age 73 y.o. , MRN: [id number] , ADDRESS: [address] [place] [address] PCP Amon Aloysius BRAVO, MD Patient Care Team: Amon Aloysius BRAVO, MD as PCP - General (Internal Medicine) Ethyl Lonni BRAVO, MD (Inactive) Fleeta Smock, Lamar BROCKS, MD (Allergy and Immunology) Addie Cordella Hamilton, MD as Consulting Physician (Orthopedic Surgery)  This Provider for this visit: Treatment Team:  Attending Provider: Geronimo Amel, MD    02/10/2024 -   Chief Complaint  Patient presents with   Consult    Referred by PCP due to recurrent pneumonia. Most recent pneumonia lasted 3 months. Has asthma but denies any other lung diseases or issues.     HPI Brett Berry State 73 y.o. -new consult visit.  Referred by Dr. Smith primary care.  He is a former smoker but quit in 1983 several decades ago.  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  He has been doing this for many years.  As part of his work he gets exposed to Jan dust.  Every 6 to 8 months the [place] that comes in from foreign countries do have mold in it.  During this time will get exposed to mold.  Otherwise he does not have any down exposure or bird exposure.  At home he denies any mold exposure or mildew exposure.  He does have acid reflux but is under control.  There is no intoxication episodes no syncope no vomit no poor dentition.  In the background of this he gets recurrent pneumonia.  Most recent one was in February 2025 that lasted through March 2025 when he needed prolonged amoxicillin .  I went back to his April 2025 chest x-ray and there is a left upper lobe rounded density.  In the same area few years ago he had definite pneumonia that then resolved.  He says that primary care calculated that he has had 6 episodes of pneumonia in 3 years.  He says that every time he gets cough chest tightness ends up in the urgent care where they  discharged him.  Then he ends up in the emergency room room and they diagnosed him with pneumonia the gets antibiotics and prednisone  and he gets better.  Between episodes he is completely asymptomatic such as he is right now.  He does have travel coming up to [place].  Lab review shows prior HIV was in 2018 but he denies any risk factors for it For the last few years he has extremely mild anemia      has a past medical history of Allergic rhinitis, Asthma, Bilateral knee pain (2016), Erectile dysfunction (07/30/2014), GERD (gastroesophageal reflux disease), Hearing difficulty, History of kidney stones, Hyperlipidemia, Pneumonia, and Sinusitis, chronic.   reports that he quit smoking about 50 years ago. His smoking use included cigarettes. He has never used smokeless tobacco.  Past Surgical History:  Procedure Laterality Date   CARPAL TUNNEL RELEASE Right 06/2018   CATARACT EXTRACTION Right 2016   COLONOSCOPY  09/25/2011   EYE SURGERY  10-15-2014   Cataract surgery, right eye   JOINT REPLACEMENT  08/30/2018   MASTOIDECTOMY     right    POLYPECTOMY     right hand surg     for infection in '07   SINUS ENDO WITH FUSION     TOE SURGERY     Left Great toe   TOTAL KNEE ARTHROPLASTY  Bilateral 08/30/2018   TOTAL KNEE ARTHROPLASTY Bilateral 08/30/2018   Procedure: BILATERAL TOTAL KNEE ARTHROPLASTY;  Surgeon: Addie Cordella Hamilton, MD;  [facility] OR;  Service: Orthopedics;  Laterality: Bilateral;   VITRECTOMY AND CATARACT Bilateral 09/2022    No Known Allergies  Immunization History  Administered Date(s) Administered   Fluad Quad(high Dose 65+) 04/19/2019, 04/22/2020, 05/19/2021, 05/26/2022   Fluad Trivalent(High Dose 65+) 05/28/2023   Influenza Split 07/03/2014   Influenza, Seasonal, Injecte, Preservative Fre 04/22/2015   Influenza,inj,Quad PF,6+ Mos 05/22/2018   Influenza-Unspecified 04/09/2016, 05/07/2017   PNEUMOCOCCAL CONJUGATE-20 05/26/2022   Pneumococcal Conjugate-13  12/29/2013   Pneumococcal Polysaccharide-23 09/09/2011   Tdap 09/09/2011, 08/01/2021   Zoster Recombinant(Shingrix) 03/02/2018, 07/04/2018   Zoster, Live 01/08/2015    Family History  Problem Relation Age of Onset   COPD Mother    Breast cancer Mother    Colon polyps Mother    Alzheimer's disease Father    Lung cancer Father        smoker   Prostate cancer Father 41       died age 10   Heart disease Father        age 69   Colon cancer Neg Hx    Esophageal cancer Neg Hx    Rectal cancer Neg Hx    Stomach cancer Neg Hx      Current Outpatient Medications:    albuterol  (VENTOLIN  HFA) 108 (90 Base) MCG/ACT inhaler, Inhale 2 puffs into the lungs every 4 (four) hours as needed for wheezing or shortness of breath., Disp: 18 g, Rfl: 2   atorvastatin  (LIPITOR) 10 MG tablet, TAKE 1 TABLET BY MOUTH EVERYDAY AT BEDTIME, Disp: 90 tablet, Rfl: 1   budesonide -formoterol  (SYMBICORT ) 160-4.5 MCG/ACT inhaler, Inhale 2 puffs into the lungs in the morning and at bedtime., Disp: 30.6 g, Rfl: 1   celecoxib  (CELEBREX ) 100 MG capsule, Take 1 capsule (100 mg total) by mouth 2 (two) times daily as needed for moderate pain (pain score 4-6)., Disp: 180 capsule, Rfl: 1   Coenzyme Q10 (CO Q 10 PO), Take 1 capsule by mouth daily. , Disp: , Rfl:    esomeprazole  (NEXIUM ) 40 MG capsule, Take 1 capsule (40 mg total) by mouth 2 (two) times daily before a meal., Disp: 180 capsule, Rfl: 1   fluticasone  (FLONASE ) 50 MCG/ACT nasal spray, Place 2 sprays into both nostrils daily for 14 days., Disp: 11.1 mL, Rfl: 0   magnesium  oxide (MAG-OX) 400 MG tablet, Take 400 mg by mouth daily., Disp: , Rfl:    Multiple Vitamin (MULTIVITAMIN WITH MINERALS) TABS tablet, Take 1 tablet by mouth daily., Disp: , Rfl:    Omega-3 Fatty Acids (FISH OIL) 1000 MG CAPS, Take 1,000 mg by mouth 2 (two) times daily. , Disp: , Rfl:    amoxicillin -clavulanate (AUGMENTIN ) 875-125 MG tablet, Take 1 tablet by mouth every 12 (twelve) hours., Disp: 14  tablet, Rfl: 0      Objective:   Vitals:   02/10/24 1508 02/10/24 1511  BP: (!) 180/80 (!) 170/80  Pulse: (!) 51   Temp: 98.1 F (36.7 C)   TempSrc: Oral   SpO2: 99%   Weight: 187 lb 9.6 oz (85.1 kg)   Height: 5' 11 (1.803 m)     Estimated body mass index is 26.16 kg/m as calculated from the following:   Height as of this encounter: 5' 11 (1.803 m).   Weight as of this encounter: 187 lb 9.6 oz (85.1 kg).  @WEIGHTCHANGE @  [employer]  02/10/24 1508  Weight: 187 lb 9.6 oz (85.1 kg)     Physical Exam   General: No distress. Looks well O2 at rest: no Cane present: no Sitting in wheel chair: no Frail: no Obese: no Neuro: Alert and Oriented x 3. GCS 15. Speech normal Psych: Pleasant Resp:  Barrel Chest - no.  Wheeze - no, Crackles - no, No overt respiratory distress CVS: Normal heart sounds. Murmurs - no Ext: Stigmata of Connective Tissue Disease - no HEENT: Normal upper airway. PEERL +. No post nasal drip        Assessment:       ICD-10-CM   1. Recurrent pneumonia  J18.9 CBC w/Diff    HIV antibody (with reflex)    Hypersensitivity pnuemonitis profile    IgG, IgA, IgM    CT Chest Wo Contrast    Pulmonary function test    Perennial allergen profile IgE    QuantiFERON-TB Gold Plus    2. Mold suspected exposure  Z77.120 CBC w/Diff    HIV antibody (with reflex)    Hypersensitivity pnuemonitis profile    IgG, IgA, IgM    CT Chest Wo Contrast    Pulmonary function test    Perennial allergen profile IgE    QuantiFERON-TB Gold Plus    3. Abnormal chest x-ray  R93.89 CBC w/Diff    HIV antibody (with reflex)    Hypersensitivity pnuemonitis profile    IgG, IgA, IgM    CT Chest Wo Contrast    Pulmonary function test    Perennial allergen profile IgE    QuantiFERON-TB Gold Plus    4. Left upper lobe pulmonary infiltrate  R91.8 CBC w/Diff    HIV antibody (with reflex)    Hypersensitivity pnuemonitis profile    IgG, IgA, IgM    CT Chest Wo Contrast     Pulmonary function test    Perennial allergen profile IgE    QuantiFERON-TB Gold Plus    5. Former smoker  Z87.891 CBC w/Diff    HIV antibody (with reflex)    Hypersensitivity pnuemonitis profile    IgG, IgA, IgM    CT Chest Wo Contrast    Pulmonary function test    Perennial allergen profile IgE    QuantiFERON-TB Gold Plus         Plan:     Patient Instructions     ICD-10-CM   1. Recurrent pneumonia  J18.9     2. Mold suspected exposure  Z77.120     3. Abnormal chest x-ray  R93.89     4. Left upper lobe pulmonary infiltrate  R91.8     5. Former smoker  Z87.891       Rule out local and systemic factors  Plan - Do CBC with differential,  IgE - Do blood IgG, IgM and IgA - Do HIV panel - Do QuantiFERON gold -Do hypersensitivity pneumonitis panel -Get CT scan of the chest without contrast  Follow-up - Video visit with [name] or nurse practitioner first available to discuss results  - Okay to give 4:30 PM slot  - firs available  - no need to wait for PFT results for appot   FOLLOWUP Return in about 6 weeks (around 03/23/2024) for with any of the APPS, with Dr Geronimo, Face to Face Visit.    SIGNATURE    Dr. Dorethia Geronimo, M.D., F.C.C.P,  Pulmonary and Critical Care Medicine Staff Physician, [facility] Director - Interstitial Lung Disease  Program  Pulmonary Fibrosis  [facility] [place], [place], [address]  Pager: [phone], If no answer or between  15:00h - 7:00h: call [phone] Telephone: [phone]  3:54 PM 02/10/2024

## 2024-02-10 NOTE — Patient Instructions (Addendum)
 ICD-10-CM   1. Recurrent pneumonia  J18.9     2. Mold suspected exposure  Z77.120     3. Abnormal chest x-ray  R93.89     4. Left upper lobe pulmonary infiltrate  R91.8     5. Former smoker  Z87.891       Rule out local and systemic factors  Plan - Do CBC with differential,  IgE - Do blood IgG, IgM and IgA - Do HIV panel - Do QuantiFERON gold -Do hypersensitivity pneumonitis panel -Get CT scan of the chest without contrast  Follow-up - Video visit with [name] or nurse practitioner first available to discuss results  - Okay to give 4:30 PM slot  - firs available  - no need to wait for PFT results for appot

## 2024-02-11 LAB — CBC WITH DIFFERENTIAL/PLATELET
Basophils Absolute: 0.1 K/uL (ref 0.0–0.1)
Basophils Relative: 1.1 % (ref 0.0–3.0)
Eosinophils Absolute: 0.8 K/uL — ABNORMAL HIGH (ref 0.0–0.7)
Eosinophils Relative: 12 % — ABNORMAL HIGH (ref 0.0–5.0)
HCT: 38.3 % — ABNORMAL LOW (ref 39.0–52.0)
Hemoglobin: 12.8 g/dL — ABNORMAL LOW (ref 13.0–17.0)
Lymphocytes Relative: 37.6 % (ref 12.0–46.0)
Lymphs Abs: 2.6 K/uL (ref 0.7–4.0)
MCHC: 33.3 g/dL (ref 30.0–36.0)
MCV: 95.7 fl (ref 78.0–100.0)
Monocytes Absolute: 0.5 K/uL (ref 0.1–1.0)
Monocytes Relative: 7.6 % (ref 3.0–12.0)
Neutro Abs: 2.9 K/uL (ref 1.4–7.7)
Neutrophils Relative %: 41.7 % — ABNORMAL LOW (ref 43.0–77.0)
Platelets: 267 K/uL (ref 150.0–400.0)
RBC: 4 Mil/uL — ABNORMAL LOW (ref 4.22–5.81)
RDW: 14.9 % (ref 11.5–15.5)
WBC: 7 K/uL (ref 4.0–10.5)

## 2024-02-14 LAB — ALLERGEN PROFILE, PERENNIAL ALLERGEN IGE

## 2024-02-14 LAB — QUANTIFERON-TB GOLD PLUS
Mitogen-NIL: 8.6 [IU]/mL
NIL: 0.03 [IU]/mL
QuantiFERON-TB Gold Plus: NEGATIVE
TB1-NIL: <0 [IU]/mL
TB2-NIL: <0 [IU]/mL

## 2024-02-16 LAB — IGG, IGA, IGM
IgG (Immunoglobin G), Serum: 1333 mg/dL (ref 600–1540)
IgM, Serum: 96 mg/dL (ref 50–300)
Immunoglobulin A: 285 mg/dL (ref 70–320)

## 2024-02-16 LAB — HYPERSENSITIVITY PNUEMONITIS PROFILE
ASPERGILLUS FUMIGATUS: NEGATIVE
Faenia retivirgula: NEGATIVE
Pigeon Serum: NEGATIVE
S. VIRIDIS: NEGATIVE
T. CANDIDUS: NEGATIVE
T. VULGARIS: NEGATIVE

## 2024-02-16 LAB — HIV ANTIBODY (ROUTINE TESTING W REFLEX): HIV 1&2 Ab, 4th Generation: NONREACTIVE

## 2024-02-18 ENCOUNTER — Other Ambulatory Visit: Payer: Self-pay | Admitting: Internal Medicine

## 2024-02-18 DIAGNOSIS — J310 Chronic rhinitis: Secondary | ICD-10-CM

## 2024-02-26 ENCOUNTER — Ambulatory Visit (HOSPITAL_BASED_OUTPATIENT_CLINIC_OR_DEPARTMENT_OTHER)
Admission: RE | Admit: 2024-02-26 | Discharge: 2024-02-26 | Disposition: A | Discharge status: Home / Self Care | Source: Ambulatory Visit | Attending: Internal Medicine | Admitting: Internal Medicine

## 2024-02-26 DIAGNOSIS — Z77120 Contact with and (suspected) exposure to mold (toxic): Secondary | ICD-10-CM | POA: Diagnosis not present

## 2024-02-26 DIAGNOSIS — J189 Pneumonia, unspecified organism: Secondary | ICD-10-CM | POA: Insufficient documentation

## 2024-02-26 DIAGNOSIS — Z87891 Personal history of nicotine dependence: Secondary | ICD-10-CM | POA: Diagnosis not present

## 2024-02-26 DIAGNOSIS — R918 Other nonspecific abnormal finding of lung field: Secondary | ICD-10-CM | POA: Insufficient documentation

## 2024-02-26 DIAGNOSIS — J479 Bronchiectasis, uncomplicated: Secondary | ICD-10-CM | POA: Diagnosis not present

## 2024-02-26 DIAGNOSIS — R911 Solitary pulmonary nodule: Secondary | ICD-10-CM | POA: Diagnosis not present

## 2024-02-26 DIAGNOSIS — R9389 Abnormal findings on diagnostic imaging of other specified body structures: Secondary | ICD-10-CM | POA: Insufficient documentation

## 2024-02-26 DIAGNOSIS — I700 Atherosclerosis of aorta: Secondary | ICD-10-CM | POA: Diagnosis not present

## 2024-02-29 DIAGNOSIS — Z961 Presence of intraocular lens: Secondary | ICD-10-CM | POA: Diagnosis not present

## 2024-02-29 DIAGNOSIS — H43813 Vitreous degeneration, bilateral: Secondary | ICD-10-CM | POA: Diagnosis not present

## 2024-02-29 DIAGNOSIS — H40013 Open angle with borderline findings, low risk, bilateral: Secondary | ICD-10-CM | POA: Diagnosis not present

## 2024-02-29 DIAGNOSIS — D492 Neoplasm of unspecified behavior of bone, soft tissue, and skin: Secondary | ICD-10-CM | POA: Diagnosis not present

## 2024-03-31 ENCOUNTER — Encounter: Payer: Self-pay | Admitting: Nurse Practitioner

## 2024-03-31 ENCOUNTER — Telehealth: Admitting: Nurse Practitioner

## 2024-03-31 ENCOUNTER — Other Ambulatory Visit (INDEPENDENT_AMBULATORY_CARE_PROVIDER_SITE_OTHER)

## 2024-03-31 DIAGNOSIS — J479 Bronchiectasis, uncomplicated: Secondary | ICD-10-CM | POA: Diagnosis not present

## 2024-03-31 DIAGNOSIS — J189 Pneumonia, unspecified organism: Secondary | ICD-10-CM

## 2024-03-31 DIAGNOSIS — R911 Solitary pulmonary nodule: Secondary | ICD-10-CM

## 2024-03-31 DIAGNOSIS — J4550 Severe persistent asthma, uncomplicated: Secondary | ICD-10-CM | POA: Diagnosis not present

## 2024-03-31 MED ORDER — PREDNISONE 10 MG PO TABS: ORAL_TABLET | ORAL | 0 refills | Status: DC

## 2024-03-31 MED ORDER — AIRSUPRA 90-80 MCG/ACT IN AERO
1.0000 | INHALATION_SPRAY | RESPIRATORY_TRACT | 3 refills | Status: AC | PRN
Start: 2024-03-31 — End: ?

## 2024-03-31 NOTE — Patient Instructions (Addendum)
 Continue Albuterol  inhaler 2 puffs or 3 mL neb every 6 hours as needed for shortness of breath or wheezing. Notify if symptoms persist despite rescue inhaler/neb use.  Continue Symbicort  2 puffs Twice daily. Brush tongue and rinse mouth afterwards Continue flonase  nasal spray 2 sprays each nostril daily Continue nexium  Twice daily   Prednisone  taper. 4 tabs for 3 days, then 3 tabs for 3 days, 2 tabs for 3 days, then 1 tab for 3 days, then stop. Take in AM with food Use Airsupra  1-2 puffs every 4-6 hours as needed for shortness of breath, wheezing, chest tightness instead of your albuterol . Brush tongue and rinse mouth afterwards  I am worried you may have recurrent infections related to something called ABPA or eosinophilic pneumonia  We will have you complete further lab work and see how you respond to the steroid course. We may need to consider an injectable medication, if this appears to be the driving factor  If the remainder of your workup is unremarkable, we may need to consider a bronchoscopy for further evaluation   Follow up in 8-12 weeks with Brett Berry or Brett Berry. If symptoms do not improve or worsen, please contact office for sooner follow up or seek emergency care.

## 2024-03-31 NOTE — Progress Notes (Deleted)
 @Patient  ID: Brett Berry, male    DOB: 01-30-1951, 73 y.o.   MRN: [id number]  No chief complaint on file.   Referring provider: Amon Aloysius BRAVO, MD  HPI:   TEST/EVENTS:     No Known Allergies  Immunization History  Administered Date(s) Administered   Fluad Quad(high Dose 65+) 04/19/2019, 04/22/2020, 05/19/2021, 05/26/2022   Fluad Trivalent(High Dose 65+) 05/28/2023   Influenza Split 07/03/2014   Influenza, Seasonal, Injecte, Preservative Fre 04/22/2015   Influenza,inj,Quad PF,6+ Mos 05/22/2018   Influenza-Unspecified 04/09/2016, 05/07/2017   PNEUMOCOCCAL CONJUGATE-20 05/26/2022   Pneumococcal Conjugate-13 12/29/2013   Pneumococcal Polysaccharide-23 09/09/2011   Tdap 09/09/2011, 08/01/2021   Zoster Recombinant(Shingrix) 03/02/2018, 07/04/2018   Zoster, Live 01/08/2015    Past Medical History:  Diagnosis Date   Allergic rhinitis    Asthma    Bilateral knee pain 2016   x-ray showed medial compartment arthritis, worse on right than left   Erectile dysfunction 07/30/2014   GERD (gastroesophageal reflux disease)    Hearing difficulty    has aids   History of kidney stones    Hyperlipidemia    intolerant to zocor    Pneumonia    Sinusitis, chronic     Tobacco History: Social History   Tobacco Use  Smoking Status Former   Current packs/day: 0.00   Types: Cigarettes   Quit date: 09/08/1973   Years since quitting: 50.5  Smokeless Tobacco Never  Tobacco Comments   Quit I the 1980s   Counseling given: Not Answered Tobacco comments: Quit I the 1980s   Outpatient Medications Prior to Visit  Medication Sig Dispense Refill   albuterol  (VENTOLIN  HFA) 108 (90 Base) MCG/ACT inhaler Inhale 2 puffs into the lungs every 4 (four) hours as needed for wheezing or shortness of breath. 18 g 2   amoxicillin -clavulanate (AUGMENTIN ) 875-125 MG tablet Take 1 tablet by mouth every 12 (twelve) hours. 14 tablet 0   atorvastatin  (LIPITOR) 10 MG tablet TAKE 1 TABLET BY MOUTH EVERYDAY  AT BEDTIME 90 tablet 1   celecoxib  (CELEBREX ) 100 MG capsule Take 1 capsule (100 mg total) by mouth 2 (two) times daily as needed for moderate pain (pain score 4-6). 180 capsule 1   Coenzyme Q10 (CO Q 10 PO) Take 1 capsule by mouth daily.      esomeprazole  (NEXIUM ) 40 MG capsule Take 1 capsule (40 mg total) by mouth 2 (two) times daily before a meal. 180 capsule 1   fluticasone  (FLONASE ) 50 MCG/ACT nasal spray Place 2 sprays into both nostrils daily for 14 days. 11.1 mL 0   magnesium  oxide (MAG-OX) 400 MG tablet Take 400 mg by mouth daily.     Multiple Vitamin (MULTIVITAMIN WITH MINERALS) TABS tablet Take 1 tablet by mouth daily.     Omega-3 Fatty Acids (FISH OIL) 1000 MG CAPS Take 1,000 mg by mouth 2 (two) times daily.      SYMBICORT  160-4.5 MCG/ACT inhaler INHALE 2 PUFFS INTO THE LUNGS IN THE MORNING AND AT BEDTIME. 30.6 each 1   No facility-administered medications prior to visit.     Review of Systems:   Constitutional: No weight loss or gain, night sweats, fevers, chills, fatigue, or lassitude. HEENT: No headaches, difficulty swallowing, tooth/dental problems, or sore throat. No sneezing, itching, ear ache, nasal congestion, or post nasal drip CV:  No chest pain, orthopnea, PND, swelling in lower extremities, anasarca, dizziness, palpitations, syncope Resp: No shortness of breath with exertion or at rest. No excess mucus or change in color of mucus.  No productive or non-productive. No hemoptysis. No wheezing.  No chest wall deformity GI:  No heartburn, indigestion, abdominal pain, nausea, vomiting, diarrhea, change in bowel habits, loss of appetite, bloody stools.  GU: No dysuria, change in color of urine, urgency or frequency.  No flank pain, no hematuria  Skin: No rash, lesions, ulcerations MSK:  No joint pain or swelling.  No decreased range of motion.  No back pain. Neuro: No dizziness or lightheadedness.  Psych: No depression or anxiety. Mood stable.     Physical Exam:  There  were no vitals taken for this visit.  GEN: Pleasant, interactive, well-nourished/chronically-ill appearing/acutely-ill appearing/poorly-nourished/morbidly obese; in no acute distress.****** HEENT:  Normocephalic and atraumatic. EACs patent bilaterally. TM pearly gray with present light reflex bilaterally. PERRLA. Sclera white. Nasal turbinates pink, moist and patent bilaterally. No rhinorrhea present. Oropharynx pink and moist, without exudate or edema. No lesions, ulcerations, or postnasal drip.  NECK:  Supple w/ fair ROM. No JVD present. Normal carotid impulses w/o bruits. Thyroid  symmetrical with no goiter or nodules palpated. No lymphadenopathy.   CV: RRR, no m/r/g, no peripheral edema. Pulses intact, +2 bilaterally. No cyanosis, pallor or clubbing. PULMONARY:  Unlabored, regular breathing. Clear bilaterally A&P w/o wheezes/rales/rhonchi. No accessory muscle use.  GI: BS present and normoactive. Soft, non-tender to palpation. No organomegaly or masses detected. No CVA tenderness. MSK: No erythema, warmth or tenderness. Cap refil <2 sec all extrem. No deformities or joint swelling noted.  Neuro: A/Ox3. No focal deficits noted.   Skin: Warm, no lesions or rashe Psych: Normal affect and behavior. Judgement and thought content appropriate.     Lab Results:  CBC    Component Value Date/Time   WBC 7.0 02/10/2024 1604   RBC 4.00 (L) 02/10/2024 1604   HGB 12.8 (L) 02/10/2024 1604   HCT 38.3 (L) 02/10/2024 1604   PLT 267.0 02/10/2024 1604   MCV 95.7 02/10/2024 1604   MCH 32.7 10/24/2023 1557   MCHC 33.3 02/10/2024 1604   RDW 14.9 02/10/2024 1604   LYMPHSABS 2.6 02/10/2024 1604   MONOABS 0.5 02/10/2024 1604   EOSABS 0.8 (H) 02/10/2024 1604   BASOSABS 0.1 02/10/2024 1604    BMET    Component Value Date/Time   NA 138 10/24/2023 1557   K 3.7 10/24/2023 1557   CL 105 10/24/2023 1557   CO2 25 10/24/2023 1557   GLUCOSE 119 (H) 10/24/2023 1557   BUN 23 10/24/2023 1557   CREATININE 1.10  10/24/2023 1557   CREATININE 1.32 (H) 04/22/2020 0852   CALCIUM  8.6 (L) 10/24/2023 1557   GFRNONAA >60 10/24/2023 1557   GFRAA >60 08/24/2018 1525    BNP No results found for: BNP   Imaging:  No results found.  Administration History     None           No data to display          No results found for: NITRICOXIDE      Assessment & Plan:   No problem-specific Assessment & Plan notes found for this encounter.   Advised if symptoms do not improve or worsen, to please contact office for sooner follow up or seek emergency care.   I spent *** minutes of dedicated to the care of this patient on the date of this encounter to include pre-visit review of records, face-to-face time with the patient discussing conditions above, post visit ordering of testing, clinical documentation with the electronic health record, making appropriate referrals as documented, and communicating necessary findings  to members of the patients care team.  Comer LULLA Rouleau, NP 03/31/2024  Pt aware and understands NP's role.

## 2024-03-31 NOTE — Progress Notes (Signed)
 Patient ID: Brett Berry, male     DOB: 1950-08-14, 73 y.o.      MRN: [id number]  No chief complaint on file.   Virtual Visit via Video Note  I connected with Brett Berry on 03/31/24 at  2:00 PM EDT by a video enabled telemedicine application and verified that I am speaking with the correct person using two identifiers.  Location: Patient: Home Provider: Office   I discussed the limitations of evaluation and management by telemedicine and the availability of in person appointments. The patient expressed understanding and agreed to proceed.  History of Present Illness: 73 year old male, former smoker followed for recurrent pneumonia and asthma. He is a patient of [name] and last seen in office 02/10/2024. Past medical history significant for chronic sinusitis, GERD, HLD.   TEST/EVENTS:  11/2023 eos 1000 02/2024 immunoglobulins normal; hypersensitivity pneumonitis panel negative; TB negative 02/26/2024 CT chest: atherosclerosis/calcifications. No LAD. LUL focal btx with small amount of endobronchial material. Peribronchial nodularity and adjacent ground glass opacity, possibly representing resolving infectious/inflammatory process. 5 mm subpleural nodule RLL. Patchy peripheral tree-in-bud nodular opacities RUL. Patchy nodular opacities RUL.   02/10/2024: OV with [name] for consult. Former smoker; quit several decades ago. Works in a Radio broadcast assistant. Exposed to dust and does travel to foreign countries often with mold exposure. No down or bird exposure and no exposure of mold in the home. Controlled acid reflux. Recurrent pneumonia with most recent in April and prior to this February. Treated with prolonged amoxicillin  in February. LUL rounded density on April 2025 CXR. PCP calculated 6 episodes of pneumonia in last 3 days. Every time gets cough ,chest tightness. Ends up at [facility] then ED. Gets treated with abx and prednisone  with improvement. Between episodes, completely asymptomatic.  Prior HIV negative in 2018. Extremely mild anemia. Blood work ordered. CT chest ordered. Hypersensitivity and allergen panel. PFT ordered  03/31/2024: Today - follow up Discussed the use of AI scribe software for clinical note transcription with the patient, who gave verbal consent to proceed.  History of Present Illness Brett Berry is a 73 year old male with asthma who presents with recurrent pneumonia episodes.  He has a history of recurrent pneumonia, with recent episodes in February and April 2025. These episodes have been evaluated with chest x-rays and a recent CT scan. He has been hospitalized for pneumonia in the past, but not in the last 20 years. Treatment typically includes antibiotics and prednisone , which improve his symptoms.  He had been doing better but has started to have similar symptoms of chest pressure and mild wheezing over last few days. He seldom runs a fever during pneumonia episodes and has not experienced fever or hemoptysis recently. No anorexia, weight loss, fatigue, night sweats. No skin lesions, joint pains.   He has asthma and uses Symbicort  twice daily. He seldom uses his rescue inhaler, albuterol . He reports no issues with breathing or persistent coughing between pneumonia episodes. He has not had any steroids since April 2025. His eosinophil count has been very high, up to 1000.  His CT scan shows bronchiectasis in the left upper lobe, resolving nodular areas, and a small nodule in the right lower lung. He has new patchy densities in the right upper lobe and tree in bud nodularity. No low oxygen levels.   He is preparing to go on vacation to [place]  for the next week.   Has not had PFT yet. No significant sinus symptoms. GERD well managed.  No Known Allergies Immunization History  Administered Date(s) Administered   Fluad Quad(high Dose 65+) 04/19/2019, 04/22/2020, 05/19/2021, 05/26/2022   Fluad Trivalent(High Dose 65+) 05/28/2023   Influenza Split  07/03/2014   Influenza, Seasonal, Injecte, Preservative Fre 04/22/2015   Influenza,inj,Quad PF,6+ Mos 05/22/2018   Influenza-Unspecified 04/09/2016, 05/07/2017   PNEUMOCOCCAL CONJUGATE-20 05/26/2022   Pneumococcal Conjugate-13 12/29/2013   Pneumococcal Polysaccharide-23 09/09/2011   Tdap 09/09/2011, 08/01/2021   Zoster Recombinant(Shingrix) 03/02/2018, 07/04/2018   Zoster, Live 01/08/2015   Past Medical History:  Diagnosis Date   Allergic rhinitis    Asthma    Bilateral knee pain 2016   x-ray showed medial compartment arthritis, worse on right than left   Erectile dysfunction 07/30/2014   GERD (gastroesophageal reflux disease)    Hearing difficulty    has aids   History of kidney stones    Hyperlipidemia    intolerant to zocor    Pneumonia    Sinusitis, chronic     Tobacco History: Social History   Tobacco Use  Smoking Status Former   Current packs/day: 0.00   Types: Cigarettes   Quit date: 09/08/1973   Years since quitting: 50.5  Smokeless Tobacco Never  Tobacco Comments   Quit I the 1980s   Counseling given: Not Answered Tobacco comments: Quit I the 1980s   Outpatient Medications Prior to Visit  Medication Sig Dispense Refill   albuterol  (VENTOLIN  HFA) 108 (90 Base) MCG/ACT inhaler Inhale 2 puffs into the lungs every 4 (four) hours as needed for wheezing or shortness of breath. 18 g 2   amoxicillin -clavulanate (AUGMENTIN ) 875-125 MG tablet Take 1 tablet by mouth every 12 (twelve) hours. 14 tablet 0   atorvastatin  (LIPITOR) 10 MG tablet TAKE 1 TABLET BY MOUTH EVERYDAY AT BEDTIME 90 tablet 1   celecoxib  (CELEBREX ) 100 MG capsule Take 1 capsule (100 mg total) by mouth 2 (two) times daily as needed for moderate pain (pain score 4-6). 180 capsule 1   Coenzyme Q10 (CO Q 10 PO) Take 1 capsule by mouth daily.      esomeprazole  (NEXIUM ) 40 MG capsule Take 1 capsule (40 mg total) by mouth 2 (two) times daily before a meal. 180 capsule 1   fluticasone  (FLONASE ) 50 MCG/ACT  nasal spray Place 2 sprays into both nostrils daily for 14 days. 11.1 mL 0   magnesium  oxide (MAG-OX) 400 MG tablet Take 400 mg by mouth daily.     Multiple Vitamin (MULTIVITAMIN WITH MINERALS) TABS tablet Take 1 tablet by mouth daily.     Omega-3 Fatty Acids (FISH OIL) 1000 MG CAPS Take 1,000 mg by mouth 2 (two) times daily.      SYMBICORT  160-4.5 MCG/ACT inhaler INHALE 2 PUFFS INTO THE LUNGS IN THE MORNING AND AT BEDTIME. 30.6 each 1   No facility-administered medications prior to visit.     Review of Systems: As above; otherwise, negative   Observations/Objective: Patient is well-developed, well-nourished in no acute distress.  Resting comfortably at home.  No labored breathing.  Speech is clear and coherent with logical content.  Patient is alert and oriented at baseline.   Assessment and Plan: No problem-specific Assessment & Plan notes found for this encounter. Assessment and Plan Assessment & Plan Recurrent pneumonia with possible allergic bronchopulmonary aspergillosis (ABPA) or eosinophilic pneumonia Recurrent pneumonia episodes with elevated eosinophil levels. Concern for ABPA or eosinophilic pneumonias. CT scan shows bronchiectasis in the left upper lobe and nodular areas, possibly related to prior infections. He does have tree-in-bud nodularity and is starting to  have acute symptoms of chest tightness and wheezing. No infectious symptoms. Will hold off on abx and treat him with prednisone  taper. Strict return precautions. Will obtain Aspergillus panel and IgE. May need bronchoscopy if workup unremarkable and symptoms persist. Action plan in place.  - Order IgE and aspergillus panel to rule in or out ABPA. - Prescribe a 12-day tapering course of prednisone . - Advise to monitor for infectious symptoms - Consider bronchoscopy if diagnosis remains unclear after lab results.  Asthma Asthma managed with Symbicort  twice daily and infrequent use of albuterol . Elevated eosinophil  levels may indicate eosinophilic-driven asthma. Symptoms include occasional wheezing. See above plan - Continue Symbicort  twice daily. - Prescribe Airsupra  inhaler for rescue use, up to 12 puffs in 24 hours, instead of albuterol . Side effect profile reviewed  - Advise to use albuterol  as needed if Airsupra  is not available.  Bronchiectasis, left upper lobe Bronchiectasis noted in the left upper lobe on CT scan, likely related to recurrent infections. - Monitor condition with repeat CT scan in about three months, pending lab results. - Obtain sputum cultures if sputum production develops   Pulmonary nodule, right lower lobe 5 mm nodule in the right lower lobe, possibly related to previous lung infections but he does have a smoking hx.  - Monitor nodule with repeat CT scan in about three months, pending lab results.   I discussed the assessment and treatment plan with the patient. The patient was provided an opportunity to ask questions and all were answered. The patient agreed with the plan and demonstrated an understanding of the instructions.   The patient was advised to call back or seek an in-person evaluation if the symptoms worsen or if the condition fails to improve as anticipated.  I provided 35 minutes of non-face-to-face time during this encounter.   Comer LULLA Rouleau, NP

## 2024-04-04 LAB — IGE: IgE (Immunoglobulin E), Serum: 87 kU/L (ref <=114)

## 2024-04-08 LAB — ASPERGILLUS IGE PANEL
A. Amstel/Glaucu Class Interp: 0
A. Flavus Class Interp: 0
A. Fumigatus Class Interp: 0
A. Nidulans Class Interp: 0
A. Niger Class Interp: 0
A. Versicolor Class Interp: 0
Aspergillus amstel/glaucu IgE*: <0.35 kU/L (ref <0.35)
Aspergillus flavus IgE: <0.35 kU/L (ref <0.35)
Aspergillus fumigatus IgE: <0.1 kU/L (ref <0.35)
Aspergillus nidulans IgE: <0.35 kU/L (ref <0.35)
Aspergillus niger IgE: <0.1 kU/L (ref <0.35)
Aspergillus versicolor IgE: <0.1 kU/L (ref <0.35)

## 2024-04-12 ENCOUNTER — Telehealth: Payer: Self-pay | Admitting: Nurse Practitioner

## 2024-04-12 NOTE — Telephone Encounter (Signed)
 Pt of your's, seen for consult for recurrent pneumonias. Significantly elevated peripheral eosinophilia. CT chest shows some focal btx, nodularity in LUL and patchy tree in bud in RUL. I checked for ABPA and this was negative. IgE is normal. Hx of asthma. Gets steroids and abx every time he's sick. He did not have infectious symptoms when I saw him last but started to have more wheezing and chest tightness, so I treated him with just steroids alone. I think he may benefit from biologic therapy. May also need to consider bronch. Let me know your thoughts. Thanks!

## 2024-04-12 NOTE — Telephone Encounter (Signed)
 Ok to do dupoxent or fasenra

## 2024-04-14 NOTE — Telephone Encounter (Signed)
 Attempted to call pt regarding biologic therapy. Advised to call back to review/ask any necessary questions.

## 2024-04-18 ENCOUNTER — Telehealth: Payer: Self-pay | Admitting: *Deleted

## 2024-04-18 NOTE — Telephone Encounter (Signed)
 Copied from CRM [id number]. Topic: Clinical - Medical Advice >> Apr 17, 2024  2:43 PM Corean SAUNDERS wrote: Reason for CRM: Patient returning call to [facility] regarding biologic therapy as she advised him to call with any questions. Advised patient per CAL the provider will call him back when she can, patient verbalized understanding.   Malachy Comer GAILS, NP to [facility]    04/14/24 12:58 PM Note Attempted to call pt regarding biologic therapy. Advised to call back to review/ask any necessary questions.          04/12/24  1:57 PM Geronimo Amel, MD routed this conversation to Malachy Comer GAILS, NP  Geronimo Amel, MD    04/12/24  1:57 PM Note Ok to do dupoxent or fasenra

## 2024-04-18 NOTE — Telephone Encounter (Signed)
 04/18/2024: Pt returned call regarding biologic therapy. Will plan to start him on Dupixent every 2 weeks. Reviewed safety profile/side effects. Verbalized understanding. Educated on proper administration. He will come Friday or Monday to complete enrollment paperwork for pharmacy team - left up front. Thanks.

## 2024-04-18 NOTE — Telephone Encounter (Signed)
 Paperwork left up front for pt to sign.

## 2024-04-20 ENCOUNTER — Telehealth: Payer: Self-pay

## 2024-04-20 ENCOUNTER — Other Ambulatory Visit (HOSPITAL_COMMUNITY): Payer: Self-pay

## 2024-04-20 NOTE — Telephone Encounter (Signed)
 Received notification from [employer] regarding a prior authorization for DUPIXENT. Authorization has been APPROVED from 04/20/2024 to 08/02/2024. Approval letter sent to scan center.  Per test claim, copay for 28 days supply is $681.80  Patient can fill through [facility]: [phone]   Patient enrolled into asthma grant through PAF: Award Period: 10/23/2023 - 04/20/2025 ID: [id number] BIN: [id number] PCN: [id number] Group: [id number] For pharmacy inquiries, contact PDMI at [phone]. For patient inquiries, contact PAF at [phone].  Sherry Pennant, PharmD, MPH, BCPS, CPP Clinical Pharmacist [facility])

## 2024-04-20 NOTE — Telephone Encounter (Signed)
 ATC patient, left VM with contact number to return call for new start DPX scheduling.   Aleck Puls, PharmD, BCPS Clinical Pharmacist  [facility]

## 2024-04-20 NOTE — Telephone Encounter (Signed)
 Patient is Dupixent new start for severe asthma  Patient enrolled into asthma grant through PAF: Award Period: 10/23/2023 - 04/20/2025 ID: [id number] BIN: [id number] PCN: [id number] Group: [id number] For pharmacy inquiries, contact PDMI at [phone]. For patient inquiries, contact PAF at [phone].

## 2024-04-20 NOTE — Telephone Encounter (Signed)
 Submitted a Prior Authorization request to [employer] for DUPIXENT via CoverMyMeds. Will update once we receive a response.  Key: [id number]

## 2024-04-20 NOTE — Telephone Encounter (Signed)
 Will initiate Dupixent biv in separate encounter

## 2024-04-25 NOTE — Telephone Encounter (Signed)
 Spoke to patient - scheduled for new start Dupixent  on 04/27/24. Due to scheduling, we will use clinic sample for first injection. He will expect phone call from [facility] for onboarding and is aware that future shipments will come from [facility] for home administration thereafter.   Aleck Puls, PharmD, BCPS Clinical Pharmacist  [facility]

## 2024-04-27 ENCOUNTER — Ambulatory Visit (INDEPENDENT_AMBULATORY_CARE_PROVIDER_SITE_OTHER)

## 2024-04-27 DIAGNOSIS — J4550 Severe persistent asthma, uncomplicated: Secondary | ICD-10-CM | POA: Diagnosis not present

## 2024-04-27 DIAGNOSIS — Z7189 Other specified counseling: Secondary | ICD-10-CM

## 2024-04-27 MED ORDER — DUPIXENT 300 MG/2ML ~~LOC~~ SOAJ
300.0000 mg | SUBCUTANEOUS | 3 refills | Status: DC
  Filled 2024-05-03: qty 4, 28d supply, fill #0
  Filled 2024-05-25 – 2024-05-29 (×2): qty 4, 28d supply, fill #1
  Filled 2024-06-22: qty 4, 28d supply, fill #2
  Filled 2024-07-24: qty 4, 28d supply, fill #3

## 2024-04-27 NOTE — Patient Instructions (Signed)
 Your next Dupixent  dose is due on 05/11/24, 05/25/24, and every 14 days thereafter. For all future doses, you will use contents of one pen (300mg ).   Dupixent  does NOT replace your other medications.  CONTINUE Symbicort  160-4.23mcg/act (Inhale 2 puffs into the lungs in the morning and at bedtime)  Your prescription will be shipped from [facility]. Their phone number is [phone]. Someone will call to schedule shipment and confirm address. They will mail your medication to your home.  You will need to be seen by your provider in 3 to 4 months to assess how Dupixent  is working for you. Please ensure you have a follow-up appointment scheduled in October or November 2025. Call our clinic if you need to make this appointment. [phone].   Stay up to date on all routine vaccines: influenza, pneumonia, COVID19, Shingles  How to manage an injection site reaction: Remember the 5 C's: COUNTER - leave on the counter at least 30 minutes but up to overnight to bring medication to room temperature. This may help prevent stinging COLD - place something cold (like an ice gel pack or cold water bottle) on the injection site just before cleansing with alcohol. This may help reduce pain CLARITIN - use Claritin (generic name is loratadine) for the first two weeks of treatment or the day of, the day before, and the day after injecting. This will help to minimize injection site reactions CORTISONE CREAM - apply if injection site is irritated and itching CALL ME - if injection site reaction is bigger than the size of your fist, looks infected, blisters, or if you develop hives

## 2024-04-27 NOTE — Progress Notes (Signed)
 HPI Patient presents today to [facility] to see pharmacy team for Dupixent  new start.  Past medical history includes chronic sinusitis, GERD, former smoker. Initial OV with [name] was 02/10/24. Referred by PCP due to recurrent pneumonia. He has asthma and bronchiectasis. History of frequent courses of antibiotics and steroids. See phone note 04/12/24 - plan to initiate biologic per discussion between [name] and [name], NP.   Respiratory Medications Current regimen: Symbicort  160-4.40mcg/act (Inhale 2 puffs into the lungs in the morning and at bedtime), Airsupra  90-80 mcg/act (Inhale 1-2 puffs into the lungs every 4 hours PRN SOB, wheezing)  Patient reports no known adherence challenges  OBJECTIVE No Known Allergies  Outpatient Encounter Medications as of 04/27/2024  Medication Sig Note   albuterol  (VENTOLIN  HFA) 108 (90 Base) MCG/ACT inhaler Inhale 2 puffs into the lungs every 4 (four) hours as needed for wheezing or shortness of breath.    Albuterol -Budesonide  (AIRSUPRA ) 90-80 MCG/ACT AERO Inhale 1-2 puffs into the lungs every 4 (four) hours as needed (shortness of breath, wheezing).    amoxicillin -clavulanate (AUGMENTIN ) 875-125 MG tablet Take 1 tablet by mouth every 12 (twelve) hours.    atorvastatin  (LIPITOR) 10 MG tablet TAKE 1 TABLET BY MOUTH EVERYDAY AT BEDTIME    celecoxib  (CELEBREX ) 100 MG capsule Take 1 capsule (100 mg total) by mouth 2 (two) times daily as needed for moderate pain (pain score 4-6). 11/26/2023: PRN   Coenzyme Q10 (CO Q 10 PO) Take 1 capsule by mouth daily.     esomeprazole  (NEXIUM ) 40 MG capsule Take 1 capsule (40 mg total) by mouth 2 (two) times daily before a meal.    fluticasone  (FLONASE ) 50 MCG/ACT nasal spray Place 2 sprays into both nostrils daily for 14 days.    magnesium  oxide (MAG-OX) 400 MG tablet Take 400 mg by mouth daily.    Multiple Vitamin (MULTIVITAMIN WITH MINERALS) TABS tablet Take 1 tablet by mouth daily.    Omega-3 Fatty  Acids (FISH OIL) 1000 MG CAPS Take 1,000 mg by mouth 2 (two) times daily.     predniSONE  (DELTASONE ) 10 MG tablet 4 tabs for 3 days, then 3 tabs for 3 days, 2 tabs for 3 days, then 1 tab for 3 days, then stop    SYMBICORT  160-4.5 MCG/ACT inhaler INHALE 2 PUFFS INTO THE LUNGS IN THE MORNING AND AT BEDTIME.    No facility-administered encounter medications on file as of 04/27/2024.     Immunization History  Administered Date(s) Administered   Fluad Quad(high Dose 65+) 04/19/2019, 04/22/2020, 05/19/2021, 05/26/2022   Fluad Trivalent(High Dose 65+) 05/28/2023   Influenza Split 07/03/2014   Influenza, Seasonal, Injecte, Preservative Fre 04/22/2015   Influenza,inj,Quad PF,6+ Mos 05/22/2018   Influenza-Unspecified 04/09/2016, 05/07/2017   PNEUMOCOCCAL CONJUGATE-20 05/26/2022   Pneumococcal Conjugate-13 12/29/2013   Pneumococcal Polysaccharide-23 09/09/2011   Tdap 09/09/2011, 08/01/2021   Zoster Recombinant(Shingrix) 03/02/2018, 07/04/2018   Zoster, Live 01/08/2015     PFTs     No data to display           Eosinophils Most recent blood eosinophil count was 800 cells/microL taken on 02/10/24.   IgE: 87 on 04/04/24   Assessment   Biologics training for dupilumab  (Dupixent )  Goals of therapy: Mechanism: human monoclonal IgG4 antibody that inhibits interleukin-4 and interleukin-13 cytokine-induced responses, including release of proinflammatory cytokines, chemokines, and IgE Reviewed that Dupixent  is add-on medication and patient must continue maintenance inhaler regimen. Response to therapy: may take 4 months to determine efficacy. Discussed that patients  generally feel improvement sooner than 4 months.  Side effects: injection site reaction (6-18%), antibody development (5-16%), ophthalmic conjunctivitis (2-16%), transient blood eosinophilia (1-2%)  Dose: 600mg  at Week 0 (administered today in clinic) followed by 300mg  every 14 days thereafter  Administration/Storage:  Reviewed  administration sites of thigh or abdomen (at least 2-3 inches away from abdomen). Reviewed the upper arm is only appropriate if caregiver is administering injection  Do not shake pen/syringe as this could lead to product foaming or precipitation. Do not use if solution is discolored or contains particulate matter or if window on prefilled pen is yellow (indicates pen has been used).  Reviewed storage of medication in refrigerator. Reviewed that Dupixent  can be stored at room temperature in unopened carton for up to 14 days.  Access: Approval of Dupixent  through: insurance and grant   Patient self-administered Dupixent  300mg /82ml x 2 (total dose 600mg ) in right lower abdomen and left lower abdomen using sample  Dupixent  300mg /48mL autoinjector pen NDC: 0024-5915-20 Lot: [id number] Expiration: 2025-05-02  Patient monitored for 30 minutes for adverse reaction.  Patient tolerated well. Patient denies itchiness and irritation at injection.  Medication Reconciliation  A drug regimen assessment was performed, including review of allergies, interactions, disease-state management, dosing and immunization history. Medications were reviewed with the patient, including name, instructions, indication, goals of therapy, potential side effects, importance of adherence, and safe use.  Drug interaction(s): none noted  PLAN Continue Dupixent  300mg  every 14 days.  Next dose is due 05/11/24 and every 14 days thereafter. Rx sent to: [facility]: [phone] .  Patient provided with pharmacy phone number. Continue maintenance inhaler regimen of: Symbicort  160-4.37mcg/act (Inhale 2 puffs into the lungs in the morning and at bedtime), Airsupra  90-80 mcg/act (Inhale 1-2 puffs into the lungs every 4 hours PRN SOB, wheezing) Patient will expect phone call from [facility] for onboarding/coordination of shipment of Dupixent  to home for next administration.   All questions encouraged and answered.   Instructed patient to reach out with any further questions or concerns.  Thank you for allowing pharmacy to participate in this patient's care.  This appointment required 45 minutes of patient care (this includes precharting, chart review, review of results, face-to-face care, etc.).

## 2024-05-03 ENCOUNTER — Other Ambulatory Visit: Payer: Self-pay

## 2024-05-03 ENCOUNTER — Other Ambulatory Visit (HOSPITAL_COMMUNITY): Payer: Self-pay

## 2024-05-03 NOTE — Progress Notes (Signed)
 Specialty Pharmacy Initial Fill Coordination Note  [facility] is a 73 y.o. male contacted today regarding initial fill of specialty medication(s) Dupilumab  (Dupixent )   Patient requested Delivery   Delivery date: 05/08/24   Verified address: [address] [place], [place] [address]   Medication will be filled on 10/2.   Patient is aware of $0 copayment.   **Pt has grant, put into [employer]**

## 2024-05-03 NOTE — Progress Notes (Signed)
 New delivery date is 05/05/24. Patient has been notified via [facility].

## 2024-05-12 NOTE — Progress Notes (Signed)
 [facility] was counseled on Dupixent  in detail prior to initial self-administration in clinic on 04/27/24 using sample. Tolerated well.   PLAN Continue Dupixent  300mg  every 14 days.  Next dose is due 05/11/24 and every 14 days thereafter. Rx sent to: [facility]: [phone] .  Patient provided with pharmacy phone number. Continue maintenance inhaler regimen of: Symbicort  160-4.49mcg/act (Inhale 2 puffs into the lungs in the morning and at bedtime), Airsupra  90-80 mcg/act (Inhale 1-2 puffs into the lungs every 4 hours PRN SOB, wheezing)  All questions encouraged and answered.  Instructed patient to reach out with any further questions or concerns  Aleck Puls, PharmD, BCPS, CPP Clinical Pharmacist  [facility]

## 2024-05-14 ENCOUNTER — Ambulatory Visit: Payer: Self-pay | Admitting: Internal Medicine

## 2024-05-14 NOTE — Progress Notes (Signed)
 Noted started on dupoxent

## 2024-05-21 ENCOUNTER — Other Ambulatory Visit: Payer: Self-pay | Admitting: Internal Medicine

## 2024-05-25 ENCOUNTER — Other Ambulatory Visit: Payer: Self-pay

## 2024-05-26 ENCOUNTER — Other Ambulatory Visit (HOSPITAL_COMMUNITY): Payer: Self-pay

## 2024-05-29 ENCOUNTER — Other Ambulatory Visit: Payer: Self-pay

## 2024-05-29 NOTE — Progress Notes (Signed)
 Specialty Pharmacy Refill Coordination Note  [facility] is a 73 y.o. male contacted today regarding refills of specialty medication(s) Dupilumab  (Dupixent )   Patient requested Delivery   Delivery date: 06/01/24   Verified address: [address] [place], [place] [address]   Medication will be filled on: 05/31/24

## 2024-05-30 ENCOUNTER — Ambulatory Visit: Admitting: Internal Medicine

## 2024-05-30 ENCOUNTER — Encounter: Payer: Self-pay | Admitting: Internal Medicine

## 2024-05-30 VITALS — BP 138/86 | HR 80 | Temp 98.1°F | Resp 16 | Ht 71.0 in | Wt 178.0 lb

## 2024-05-30 DIAGNOSIS — Z23 Encounter for immunization: Secondary | ICD-10-CM

## 2024-05-30 DIAGNOSIS — E785 Hyperlipidemia, unspecified: Secondary | ICD-10-CM | POA: Diagnosis not present

## 2024-05-30 DIAGNOSIS — R739 Hyperglycemia, unspecified: Secondary | ICD-10-CM | POA: Diagnosis not present

## 2024-05-30 DIAGNOSIS — Z0000 Encounter for general adult medical examination without abnormal findings: Secondary | ICD-10-CM | POA: Diagnosis not present

## 2024-05-30 DIAGNOSIS — J479 Bronchiectasis, uncomplicated: Secondary | ICD-10-CM | POA: Diagnosis not present

## 2024-05-30 LAB — COMPREHENSIVE METABOLIC PANEL WITH GFR
ALT: 17 U/L (ref 0–53)
AST: 23 U/L (ref 0–37)
Albumin: 4 g/dL (ref 3.5–5.2)
Alkaline Phosphatase: 50 U/L (ref 39–117)
BUN: 19 mg/dL (ref 6–23)
CO2: 30 meq/L (ref 19–32)
Calcium: 8.9 mg/dL (ref 8.4–10.5)
Chloride: 107 meq/L (ref 96–112)
Creatinine, Ser: 1.31 mg/dL (ref 0.40–1.50)
GFR: 54.75 mL/min — ABNORMAL LOW (ref >60.00)
Glucose, Bld: 90 mg/dL (ref 70–99)
Potassium: 5.1 meq/L (ref 3.5–5.1)
Sodium: 143 meq/L (ref 135–145)
Total Bilirubin: 0.8 mg/dL (ref 0.2–1.2)
Total Protein: 6.3 g/dL (ref 6.0–8.3)

## 2024-05-30 LAB — CBC WITH DIFFERENTIAL/PLATELET
Basophils Absolute: 0 K/uL (ref 0.0–0.1)
Basophils Relative: 0.8 % (ref 0.0–3.0)
Eosinophils Absolute: 0.9 K/uL — ABNORMAL HIGH (ref 0.0–0.7)
Eosinophils Relative: 14.9 % — ABNORMAL HIGH (ref 0.0–5.0)
HCT: 38.4 % — ABNORMAL LOW (ref 39.0–52.0)
Hemoglobin: 13 g/dL (ref 13.0–17.0)
Lymphocytes Relative: 36.2 % (ref 12.0–46.0)
Lymphs Abs: 2.1 K/uL (ref 0.7–4.0)
MCHC: 33.8 g/dL (ref 30.0–36.0)
MCV: 97.8 fl (ref 78.0–100.0)
Monocytes Absolute: 0.4 K/uL (ref 0.1–1.0)
Monocytes Relative: 6.6 % (ref 3.0–12.0)
Neutro Abs: 2.4 K/uL (ref 1.4–7.7)
Neutrophils Relative %: 41.5 % — ABNORMAL LOW (ref 43.0–77.0)
Platelets: 253 K/uL (ref 150.0–400.0)
RBC: 3.93 Mil/uL — ABNORMAL LOW (ref 4.22–5.81)
RDW: 14.7 % (ref 11.5–15.5)
WBC: 5.8 K/uL (ref 4.0–10.5)

## 2024-05-30 LAB — LIPID PANEL
Cholesterol: 168 mg/dL (ref 0–200)
HDL: 55.9 mg/dL (ref >39.00)
LDL Cholesterol: 96 mg/dL (ref 0–99)
NonHDL: 112.23
Total CHOL/HDL Ratio: 3
Triglycerides: 79 mg/dL (ref 0.0–149.0)
VLDL: 15.8 mg/dL (ref 0.0–40.0)

## 2024-05-30 LAB — HEMOGLOBIN A1C: Hgb A1c MFr Bld: 5.7 % (ref 4.6–6.5)

## 2024-05-30 LAB — PSA: PSA: 2.35 ng/mL (ref 0.10–4.00)

## 2024-05-30 NOTE — Patient Instructions (Addendum)
 GO TO THE LAB :  Get the blood work    Then, go to the front desk for the checkout Please make an appointment for a checkup in 6 months    Continue checking your blood pressure regularly Blood pressure goal:  between 110/65 and  135/85. If it is consistently higher or lower, let me know    YOUR PLAN:  Future issues:  Your bronchiectasis and asthma are well-managed with Dupixent , and you have shown significant symptom improvement. -Continue using Dupixent  as prescribed by your pulmonologist.  Also continue your inhalers -Follow up with your pulmonary doctor as scheduled. -Undergo a CT scan and is planning  ELEVATED BLOOD PRESSURE: Your blood pressure was slightly elevated today but improved upon recheck. Your home readings are within a manageable range. -Continue to monitor your blood pressure at home.    HYPERLIPIDEMIA: Your cholesterol levels are being managed with atorvastatin , and no new issues were reported. -Check your cholesterol levels.  HYPERGLYCEMIA: We are monitoring your blood sugar levels to assess your current status. -Check your A1c levels.  ADULT WELLNESS VISIT: You are overall well and engage in regular physical activity. -Continue your current lifestyle and exercise regimen.  IMMUNIZATION MANAGEMENT: You are due for a flu shot and we discussed the COVID booster and RSV vaccine due to your lung condition risk. -You received your flu shot today. -Consider getting the COVID booster and RSV vaccine, which are available at the pharmacy.

## 2024-05-30 NOTE — Progress Notes (Unsigned)
 Subjective:    Patient ID: [facility], male    DOB: 10-Jan-1951, 73 y.o.   MRN: [id number]  DOS:  05/30/2024 CPX  Discussed the use of AI scribe software for clinical note transcription with the patient, who gave verbal consent to proceed.  History of Present Illness [facility] is a 73 year old male who presents for a routine physical exam.  Respiratory symptoms - Bronchiectasis and asthma managed by pulmonary specialists - Receives Dupixent  injections every fourteen days - Very little cough - No recent episodes of coughing up sputum - No recent respiratory symptoms such as increased cough or sputum production - Scheduled for CT scan of lungs on July 21, 2024  Musculoskeletal symptoms - Occasional generalized aches and pains  Cardiovascular and blood pressure symptoms - Blood pressure at home typically ranges from 135 to 138 mmHg systolic - No chest pain  Gastrointestinal and genitourinary symptoms - No nausea, vomiting, diarrhea, or blood in stools - No urinary problems  Functional status - Maintains an active lifestyle, walking five to ten miles a day for five to six days a week - Works full-time, approximately fifty hours a week    Review of Systems See above   Past Medical History:  Diagnosis Date   Allergic rhinitis    Asthma    Bilateral knee pain 2016   x-ray showed medial compartment arthritis, worse on right than left   Erectile dysfunction 07/30/2014   GERD (gastroesophageal reflux disease)    Hearing difficulty    has aids   History of kidney stones    Hyperlipidemia    intolerant to zocor    Pneumonia    Sinusitis, chronic     Past Surgical History:  Procedure Laterality Date   CARPAL TUNNEL RELEASE Right 06/2018   CATARACT EXTRACTION Right 2016   COLONOSCOPY  09/25/2011   EYE SURGERY  10-15-2014   Cataract surgery, right eye   JOINT REPLACEMENT  08/30/2018   MASTOIDECTOMY     right    POLYPECTOMY     right hand surg     for  infection in '07   SINUS ENDO WITH FUSION     TOE SURGERY     Left Great toe   TOTAL KNEE ARTHROPLASTY Bilateral 08/30/2018   TOTAL KNEE ARTHROPLASTY Bilateral 08/30/2018   Procedure: BILATERAL TOTAL KNEE ARTHROPLASTY;  Surgeon: Addie Cordella Hamilton, MD;  Location: [facility] OR;  Service: Orthopedics;  Laterality: Bilateral;   VITRECTOMY AND CATARACT Bilateral 09/2022    Current Outpatient Medications  Medication Instructions   albuterol  (VENTOLIN  HFA) 108 (90 Base) MCG/ACT inhaler 2 puffs, Inhalation, Every 4 hours PRN   Albuterol -Budesonide  (AIRSUPRA ) 90-80 MCG/ACT AERO 1-2 puffs, Inhalation, Every 4 hours PRN   atorvastatin  (LIPITOR) 10 mg, Oral, Daily at bedtime   celecoxib  (CELEBREX ) 100 mg, Oral, 2 times daily PRN   cholecalciferol (VITAMIN D3) 400 Units, Daily   Coenzyme Q10 (CO Q 10 PO) 1 capsule, Daily   Dupixent  300 mg, Subcutaneous, Every 14 days   esomeprazole  (NEXIUM ) 40 mg, Oral, 2 times daily before meals   Fish Oil 1,000 mg, 2 times daily   fluticasone  (FLONASE ) 50 MCG/ACT nasal spray 2 sprays, Each Nare, Daily   magnesium  oxide (MAG-OX) 400 mg, Daily   Multiple Vitamin (MULTIVITAMIN WITH MINERALS) TABS tablet 1 tablet, Daily   SYMBICORT  160-4.5 MCG/ACT inhaler 2 puffs, Inhalation, 2 times daily, in the morning and at bedtime.       Objective:   Physical Exam  BP (!) 144/74   Pulse 80   Temp 98.1 F (36.7 C) (Oral)   Resp 16   Ht 5' 11 (1.803 m)   Wt 178 lb (80.7 kg)   SpO2 95%   BMI 24.83 kg/m  General: Well developed, NAD, BMI noted Neck: No  thyromegaly  HEENT:  Normocephalic . Face symmetric, atraumatic Lungs:  CTA B Normal respiratory effort, no intercostal retractions, no accessory muscle use. Heart: RRR,  no murmur.  Abdomen:  Not distended, soft, non-tender. No rebound or rigidity.   Lower extremities: no pretibial edema bilaterally  Skin: Exposed areas without rash. Not pale. Not jaundice Neurologic:  alert & oriented X3.  Speech normal, gait  appropriate for age and unassisted Strength symmetric and appropriate for age.  Psych: Cognition and judgment appear intact.  Cooperative with normal attention span and concentration.  Behavior appropriate. No anxious or depressed appearing.     Assessment     Assessment Hyperlipidemia ---intolerant to simvastatin  Asthma GERD Pulmonary (see OV 03/2024) - Asthma - Recurrent pneumonias (OV pulmonary 03/2024, see note) -Bronchiectasis and lung nodularities per CT E.D. ZWU:Rymnwpr sinusitis, Rhinitis, chronic hoarseness, chronic perforated TM (R), hearing aids   DJD- knees R>>L, wrists  , Dr Harden Bradycardia  Here for CPX - Td 2022 - PNM shot 2013; PNM 13: 2015; PNM 20: 2023 -  zostavax 2016;  S/p  shingrex x 2 2019 - Flu shot today - Vaccines I recommend:  COVID booster, RSV -[facility]- 09/25/11-adenomatous polyps and diverticulosis; C-scope 11-2019, next 2028 per GI letter  -Prostate cancer screening: No symptoms, check PSA. - Labs:  CMP FLP CBC A1c PSA -He continued to do well with lifestyle, taking frequent walks.   Assessment and Plan Assessment & Plan Bronchiectasis and Asthma Well-managed with Dupixent , significant symptom improvement. - Continue Dupixent  as prescribed by pulmonologist. - Follow up with pulmonary doctor as scheduled. - Undergo CT scan on December 19 for lung evaluation.  Elevated blood pressure Blood pressure elevated at 144/74, improved upon recheck, home readings 135-138. - Recommend monitoring blood pressure at home. - Follow up in six months.  Hyperlipidemia Managed with atorvastatin , no new issues reported. - Check cholesterol levels.  Hyperglycemia Monitoring A1c to assess current status. - Check A1c levels.  Adult Wellness Visit Overall well, engages in regular physical activity. - Continue current lifestyle and exercise regimen.  Immunization management Due for flu shot, discussed COVID booster and RSV vaccine due to lung condition  risk. - Administer flu shot today. - Consider COVID booster and RSV vaccine, available at pharmacy.    Recurrent pneumonias.  Saw pulmonary 03/31/2024.  They rec to rule out ABPA, eosinophilic pneumonias. CT chest showed bronchiectasis and the left upper lobe from the nodular area possibly related to prior infections.  Consider bronchoscopy. For asthma was rec to continue Symbicort  and Airsupra  as needed.  Started Dupixent  and that seems to be working really well for him, making a difference since he started        ===== PLAN: Recurrent pneumonias: Had pneumonia as documented by chest x-ray 2018, 2019, 2022 and now w/  multifocal pneumonia. He has a history of asthma on chronic inhalers and GERD on chronic PPIs. He stopped smoking in the 1980s. Plan: Chest x-ray, refer to pulmonary regards recurrent pneumonias. Hyperglycemia: Mild, check A1c Asthma: Continue Symbicort . GERD: Controlled on PPIs.  No change High cholesterol: Well-controlled on atorvastatin  Preventive care: Recommend RSV Social: He actually works in [place] 2 weeks of every month. RTC 05/2024 CPX

## 2024-05-31 ENCOUNTER — Encounter: Payer: Self-pay | Admitting: Internal Medicine

## 2024-05-31 NOTE — Assessment & Plan Note (Signed)
 Here for CPX - Td 2022 - PNM shot 2013; PNM 13: 2015; PNM 20: 2023 -  zostavax 2016;  S/p  shingrex x 2 2019 - Flu shot today - Vaccines I recommend:  COVID booster, RSV -[facility]- 09/25/11-adenomatous polyps and diverticulosis; C-scope 11-2019, next 2028 per GI letter  -Prostate cancer screening: No symptoms, check PSA. - Labs:  CMP FLP CBC A1c PSA -He continued to do well with lifestyle, taking frequent walks.

## 2024-05-31 NOTE — Assessment & Plan Note (Signed)
 Here for CPX  Other issues addressed today Bronchiectasis and Asthma Saw pulmonary 03/31/2024 due to recurrent pneumonias, they rec to rule out ABPA, eosinophilic pneumonias. CT chest showed bronchiectasis and the left upper lobe from the nodular area possibly related to prior infections.  Consider bronchoscopy. For asthma was rec to continue Symbicort  and Airsupra  as needed.  Started Dupixent  and that seems to be working really well for him, making a difference since he started Plan: Continue Dupixent  as prescribed by pulmonologist.  Will proceed with CT scan December 19. Elevated blood pressure Blood pressure elevated at 144/74, improved upon recheck, home readings 135-138. - Recommend monitoring blood pressure at home. High cholesterol: Managed with atorvastatin , no new issues reported.  Check FLP. Hyperglycemia   Check A1c levels. RTC 6 months

## 2024-06-01 ENCOUNTER — Ambulatory Visit: Payer: Self-pay | Admitting: Internal Medicine

## 2024-06-22 ENCOUNTER — Other Ambulatory Visit (HOSPITAL_COMMUNITY): Payer: Self-pay

## 2024-06-27 ENCOUNTER — Other Ambulatory Visit: Payer: Self-pay

## 2024-06-27 NOTE — Progress Notes (Signed)
 Specialty Pharmacy Refill Coordination Note  [facility] is a 73 y.o. male contacted today regarding refills of specialty medication(s) Dupilumab  (Dupixent )   Patient requested Delivery   Delivery date: 07/04/24   Verified address: [address] [place], [place] [address]   Medication will be filled on: 07/03/24

## 2024-07-03 ENCOUNTER — Other Ambulatory Visit: Payer: Self-pay

## 2024-07-21 ENCOUNTER — Inpatient Hospital Stay: Admission: RE | Admit: 2024-07-21 | Discharge: 2024-07-21 | Attending: Nurse Practitioner

## 2024-07-21 DIAGNOSIS — J479 Bronchiectasis, uncomplicated: Secondary | ICD-10-CM

## 2024-07-21 DIAGNOSIS — J189 Pneumonia, unspecified organism: Secondary | ICD-10-CM

## 2024-07-21 DIAGNOSIS — J4550 Severe persistent asthma, uncomplicated: Secondary | ICD-10-CM

## 2024-07-24 ENCOUNTER — Other Ambulatory Visit (HOSPITAL_COMMUNITY): Payer: Self-pay

## 2024-07-24 ENCOUNTER — Telehealth: Payer: Self-pay

## 2024-07-24 ENCOUNTER — Ambulatory Visit: Payer: Self-pay | Admitting: Nurse Practitioner

## 2024-07-24 DIAGNOSIS — J4550 Severe persistent asthma, uncomplicated: Secondary | ICD-10-CM

## 2024-07-24 NOTE — Telephone Encounter (Signed)
 Spoke to patient. He is requesting a refill on Dupixent  and patient assistance.   Pharmacy team, please advise. Thanks

## 2024-07-25 ENCOUNTER — Other Ambulatory Visit: Payer: Self-pay

## 2024-07-25 MED ORDER — DUPIXENT 300 MG/2ML ~~LOC~~ SOAJ
300.0000 mg | SUBCUTANEOUS | 3 refills | Status: AC
  Filled 2024-07-25 (×2): qty 4, 28d supply, fill #0
  Filled 2024-08-21: qty 4, 28d supply, fill #1

## 2024-07-25 NOTE — Telephone Encounter (Signed)
 Renewed Rx for Dupixent . He is enrolled in grant, so no further action needed at this time for patient assistance.

## 2024-07-25 NOTE — Progress Notes (Signed)
 Specialty Pharmacy Refill Coordination Note  [facility] is a 73 y.o. male contacted today regarding refills of specialty medication(s) Dupilumab  (Dupixent )   Patient requested Delivery   Delivery date: 08/01/24   Verified address: [address] [place], [place] [address]   Medication will be filled on: 07/31/24

## 2024-07-31 ENCOUNTER — Other Ambulatory Visit: Payer: Self-pay

## 2024-08-20 ENCOUNTER — Other Ambulatory Visit: Payer: Self-pay | Admitting: Internal Medicine

## 2024-08-20 DIAGNOSIS — J310 Chronic rhinitis: Secondary | ICD-10-CM

## 2024-08-21 ENCOUNTER — Other Ambulatory Visit: Payer: Self-pay

## 2024-08-23 ENCOUNTER — Other Ambulatory Visit: Payer: Self-pay

## 2024-08-23 NOTE — Progress Notes (Signed)
 Specialty Pharmacy Refill Coordination Note  [facility] is a 74 y.o. male contacted today regarding refills of specialty medication(s) Dupilumab  (Dupixent )   Patient requested Delivery   Delivery date: 09/01/24   Verified address: [address], [place], [place] [address]   Medication will be filled on: 08/31/24

## 2024-08-29 ENCOUNTER — Ambulatory Visit: Admitting: Internal Medicine

## 2024-08-31 ENCOUNTER — Other Ambulatory Visit: Payer: Self-pay

## 2024-09-01 ENCOUNTER — Other Ambulatory Visit: Payer: Self-pay | Admitting: Internal Medicine

## 2024-11-15 ENCOUNTER — Ambulatory Visit

## 2024-11-28 ENCOUNTER — Ambulatory Visit: Admitting: Internal Medicine

## 2024-12-07 ENCOUNTER — Ambulatory Visit: Admitting: Internal Medicine
